# Patient Record
Sex: Male | Born: 1960
Health system: Southern US, Community
[De-identification: ages and names within clinical notes are randomized; demographics above are authoritative.]

## PROBLEM LIST (undated history)

## (undated) DIAGNOSIS — E538 Deficiency of other specified B group vitamins: Secondary | ICD-10-CM

## (undated) DIAGNOSIS — K760 Fatty (change of) liver, not elsewhere classified: Secondary | ICD-10-CM

## (undated) DIAGNOSIS — G4730 Sleep apnea, unspecified: Secondary | ICD-10-CM

## (undated) DIAGNOSIS — E7800 Pure hypercholesterolemia, unspecified: Secondary | ICD-10-CM

## (undated) DIAGNOSIS — Z808 Family history of malignant neoplasm of other organs or systems: Secondary | ICD-10-CM

## (undated) DIAGNOSIS — H9191 Unspecified hearing loss, right ear: Secondary | ICD-10-CM

## (undated) DIAGNOSIS — Z800 Family history of malignant neoplasm of digestive organs: Secondary | ICD-10-CM

## (undated) DIAGNOSIS — Z8042 Family history of malignant neoplasm of prostate: Secondary | ICD-10-CM

## (undated) DIAGNOSIS — I509 Heart failure, unspecified: Secondary | ICD-10-CM

## (undated) DIAGNOSIS — E559 Vitamin D deficiency, unspecified: Secondary | ICD-10-CM

## (undated) DIAGNOSIS — R7303 Prediabetes: Secondary | ICD-10-CM

## (undated) HISTORY — DX: Pure hypercholesterolemia, unspecified: E78.00

## (undated) HISTORY — DX: Prediabetes: R73.03

## (undated) HISTORY — DX: Family history of malignant neoplasm of other organs or systems: Z80.8

## (undated) HISTORY — DX: Unspecified hearing loss, right ear: H91.91

## (undated) HISTORY — PX: NASAL SEPTUM SURGERY: SHX37

## (undated) HISTORY — DX: Sleep apnea, unspecified: G47.30

## (undated) HISTORY — DX: Fatty (change of) liver, not elsewhere classified: K76.0

## (undated) HISTORY — PX: CARDIAC CATHETERIZATION: SHX172

## (undated) HISTORY — DX: Family history of malignant neoplasm of digestive organs: Z80.0

## (undated) HISTORY — DX: Heart failure, unspecified: I50.9

## (undated) HISTORY — PX: INSERT / REPLACE / REMOVE PACEMAKER: SUR710

## (undated) HISTORY — DX: Deficiency of other specified B group vitamins: E53.8

## (undated) HISTORY — DX: Family history of malignant neoplasm of prostate: Z80.42

## (undated) HISTORY — DX: Vitamin D deficiency, unspecified: E55.9

---

## 2012-11-11 ENCOUNTER — Encounter: Payer: Self-pay | Admitting: Neurology

## 2012-11-11 ENCOUNTER — Ambulatory Visit (INDEPENDENT_AMBULATORY_CARE_PROVIDER_SITE_OTHER): Payer: BLUE CROSS/BLUE SHIELD | Admitting: Neurology

## 2012-11-11 VITALS — BP 109/75 | HR 65 | Ht 66.0 in | Wt 203.0 lb

## 2012-11-11 DIAGNOSIS — IMO0001 Reserved for inherently not codable concepts without codable children: Secondary | ICD-10-CM

## 2012-11-11 DIAGNOSIS — Z820 Family history of epilepsy and other diseases of the nervous system: Secondary | ICD-10-CM

## 2012-11-11 DIAGNOSIS — R51 Headache: Secondary | ICD-10-CM

## 2012-11-11 DIAGNOSIS — R519 Headache, unspecified: Secondary | ICD-10-CM

## 2012-11-11 DIAGNOSIS — R569 Unspecified convulsions: Secondary | ICD-10-CM

## 2012-11-11 NOTE — Progress Notes (Signed)
Subjective:    Patient ID: Brandon Madden is a 52 y.o. male.  HPI Huston Foley, MD, PhD [facility] [address], [address] [address] [place], [place] [address]  Dear Dr. [place],   I saw your patient, [name], upon your kind request in my neurologic clinic today for initial consultation of his episodic lightheadedness and memory issues. The patient is unaccompanied today. As you know, [name] is a very pleasant 52 year old right-handed gentleman with an underlying medical history of hyperlipidemia, obstructive sleep apnea on CPAP, vitamin D deficiency, hearing loss, and allergic rhinitis who has a several year history of intermittent lightheadedness and dizziness associated with nausea followed by a headache. His first episode was in 2002 or 2003, and consisted of a sensation coming on in his head and it feels, as if he is about to pass out. He becomes pale, does not feel like talking, but there is no slurring of speech. No change in vision, as in, no diplopia, no constriction in his VF, no blurry vision. He has associated nausea. This lasts for 1-4 minutes, and is followed by a generalized headache, which a tightness-like sensation, no photo or sonophobia, no N/V at the time. He HA lasts all day. He feels very exhausted afterwards.  Frequency was very sporadic, every other year and he had a very extensive, mostly cardiac w/u at the time. In the last 7 weeks he has had 2 episodes, which is unusual and disturbing to him. He does not smoke or drink, no additional stressors, no changes in meds, no lifestyle changes.  His 36 yo son has epilepsy since infancy. He himself has never had a twitching spell or convulsion or staring spell.  He has a FHx of migraine only in one sister; he is one of 10 children. Of note, he has R ear hearing loss since infancy; he started having a tuft of grey hair since age 21 and was suspected to have Waardenburg Syndrome.  He does not remember if he  had an MRI brain or EEG in the past. He has had mild memory loss. He moved to [place] in 6/12. 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 He has a FHx of AD in his father and both paternal grandparents. Father passed away at age 22 with a sudden MI. Mother is 69 and in good health. No FHx of stroke. Never had TIA or stroke symptoms, denying sudden onset of one sided weakness, numbness, tingling, slurring of speech or droopy face, new hearing loss, tinnitus, diplopia or visual field cut or monocular loss of vision.    His Past Medical History Is Significant For: Past Medical History  Diagnosis Date  . Deafness in right ear   . Sleep apnea     His Past Surgical History Is Significant For: Past Surgical History  Procedure Laterality Date  . Nasal septum surgery      His Family History Is Significant For: Family History  Problem Relation Age of Onset  . Heart attack Father     His Social History Is Significant For: History   Social History  . Marital Status: Married    Spouse Name: [name]    Number of Children: 4  . Years of Education: Law   Occupational History  .      [employer]   Social History Main Topics  . Smoking status: Never Smoker   . Smokeless tobacco: None  . Alcohol Use: No     Comment: quit:  2011  . Drug Use: No  . Sexual Activity: None   Other Topics Concern  . None   Social History Narrative   Patient lives at home with his family.   Caffeine Use: 2-3 cups daily    His Allergies Are:  Allergies  Allergen Reactions  . Bee Venom   :   His Current Medications Are:  Outpatient Encounter Prescriptions as of 11/11/2012  Medication Sig Dispense Refill  . CRESTOR 10 MG tablet Take 1 tablet by mouth daily.       No facility-administered encounter medications on file as of 11/11/2012.  : Review of Systems:  Out of a complete 14 point review of systems, all are reviewed and negative with the exception of these symptoms as listed below:  Review of Systems   HENT:       Ringine in ears  Respiratory:       Snoring  Neurological:       Memory loss Dizziness  Psychiatric/Behavioral: Positive for sleep disturbance (snoring).    Objective:  Neurologic Exam  Physical Exam Physical Examination:   Filed Vitals:   11/11/12 0843  BP: 109/75  Pulse: 65    General Examination: The patient is a very pleasant 52 y.o. male in no acute distress. He appears well-developed and well-nourished and very well groomed.   HEENT: Normocephalic, atraumatic, pupils are equal, round and reactive to light and accommodation. Funduscopic exam is normal with sharp disc margins noted. Extraocular tracking is good without limitation to gaze excursion or nystagmus noted. Normal smooth pursuit is noted. Hearing is grossly intact. Tympanic membranes are clear bilaterally. Face is symmetric with normal facial animation and normal facial sensation. Speech is clear with no dysarthria noted. There is no hypophonia. There is no lip, neck/head, jaw or voice tremor. Neck is supple with full range of passive and active motion. There are no carotid bruits on auscultation. Oropharynx exam reveals: mild mouth dryness, adequate dental hygiene and moderate airway crowding. Mallampati is class III. Tongue protrudes centrally and palate elevates symmetrically.    Chest: Clear to auscultation without wheezing, rhonchi or crackles noted.  Heart: S1+S2+0, regular and normal without murmurs, rubs or gallops noted.   Abdomen: Soft, non-tender and non-distended with normal bowel sounds appreciated on auscultation.  Extremities: There is no pitting edema in the distal lower extremities bilaterally. Pedal pulses are intact.  Skin: Warm and dry without trophic changes noted. There are no varicose veins.   Musculoskeletal: exam reveals no obvious joint deformities, tenderness or joint swelling or erythema.   Neurologically:  Mental status: The patient is awake, alert and oriented in all 4  spheres. His memory, attention, language and knowledge are appropriate. There is no aphasia, agnosia, apraxia or anomia. Speech is clear with normal prosody and enunciation. Thought process is linear. Mood is congruent and affect is normal.  Cranial nerves are as described above under HEENT exam. In addition, shoulder shrug is normal with equal shoulder height noted. Motor exam: Normal bulk, strength and tone is noted. There is no drift, tremor or rebound. Romberg is negative. Reflexes are 2+ throughout. Toes are downgoing bilaterally. Fine motor skills are intact with normal finger taps, normal hand movements, normal rapid alternating patting, normal foot taps and normal foot agility.  Cerebellar testing shows no dysmetria or intention tremor on finger to nose testing. Heel to shin is unremarkable bilaterally. There is no truncal or gait ataxia.  Sensory exam is intact to light touch, pinprick, vibration, temperature sense and  proprioception in the upper and lower extremities.  Gait, station and balance are unremarkable. No veering to one side is noted. No leaning to one side is noted. Posture is age-appropriate and stance is narrow based. No problems turning are noted. He turns en bloc. Tandem walk is unremarkable. Intact toe and heel stance is noted.               Assessment and Plan:   In summary, Maxfield Gildersleeve is a very pleasant 52 y.o.-year old male with a history of episodic lightheadedness, or dizziness, associated with nausea, which last for minutess and followed by a HA and fatigue, which last all day. His physical exam is stable and non-focal at this time and I reassured the patient in that regard.  I had a long chat with the patient about my findings and his Sx. I think the DDx include migraines, Sz auras, BP fluctuation, cardiac issues, much less likely TIA. I would like to start the workup with an EEG as well as a brain MRI with and without contrast because of the paroxysmal nature of these  episodes. I will do some additional blood work as well. I see that you have checked RPR, B12, vitamin D, and methylmalonic acid in your office on 10/28/2012. I do not have the results of those tests available at this time. I do not think we need to initiate any new medication at this time. We talked about maintaining a healthy lifestyle in general. I encouraged the patient to eat healthy, exercise daily and keep well hydrated, to keep a scheduled bedtime and wake time routine, to not skip any meals and eat healthy snacks in between meals and to have protein with every meal.    I answered all his questions today and the patient was in agreement with the above outlined plan. I would like to see the patient back in 3 months, sooner if the need arises and encouraged him to call with any interim questions, concerns, problems or updates and test results.   Thank you very much for allowing me to participate in the care of this nice patient. If I can be of any further assistance to you please do not hesitate to call me at [phone].  Sincerely,   Huston Foley, MD, PhD

## 2012-11-11 NOTE — Patient Instructions (Signed)
I think overall you are doing fairly well but I do want to suggest a few things today:  Remember to drink plenty of fluid, eat healthy meals and do not skip any meals. Try to eat protein with a every meal and eat a healthy snack such as fruit or nuts in between meals. Try to keep a regular sleep-wake schedule and try to exercise daily, particularly in the form of walking, 20-30 minutes a day, if you can.   As far as your medications are concerned, I would like to suggest: no new medications.   As far as diagnostic testing: MRI brain, EEG, blood work.   I would like to see you back in 3 months, sooner if we need to. Please call us with any interim questions, concerns, problems, updates or refill requests.  Please also call us for any test results so we can go over those with you on the phone. Brett Canales is my clinical assistant and will answer any of your questions and relay your messages to me and also relay most of my messages to you.  Our phone number is [phone]. We also have an after hours call service for urgent matters and there is a physician on-call for urgent questions. For any emergencies you know to call 911 or go to the nearest emergency room.

## 2012-11-12 LAB — COMPREHENSIVE METABOLIC PANEL
ALT: 29 IU/L (ref 0–44)
BUN/Creatinine Ratio: 19 (ref 9–20)
CO2: 29 mmol/L (ref 18–29)
Calcium: 9.4 mg/dL (ref 8.7–10.2)
Chloride: 102 mmol/L (ref 97–108)
Glucose: 99 mg/dL (ref 65–99)
Potassium: 4.7 mmol/L (ref 3.5–5.2)
Total Protein: 7 g/dL (ref 6.0–8.5)

## 2012-11-12 LAB — SEDIMENTATION RATE: Sed Rate: 4 mm/hr (ref 0–30)

## 2012-11-12 LAB — TSH: TSH: 1.75 u[IU]/mL (ref 0.450–4.500)

## 2012-11-12 LAB — C-REACTIVE PROTEIN: CRP: 6.9 mg/L — ABNORMAL HIGH (ref 0.0–4.9)

## 2012-11-12 LAB — ANA W/REFLEX: Anti Nuclear Antibody(ANA): NEGATIVE

## 2012-11-12 LAB — HGB A1C W/O EAG: Hgb A1c MFr Bld: 5.8 % — ABNORMAL HIGH (ref 4.8–5.6)

## 2012-11-12 NOTE — Progress Notes (Signed)
Quick Note:  Please advise patient that his lab work showed normal thyroid function screening test, normal sedimentation rate, normal autoimmune marker called ANA. He did have a mildly elevated C. reactive protein which is a nonspecific marker for any inflammation or infection in the body. This is a nonspecific test results but may indicate that he has an inflammation in his joint. This could be from back pain as well. His diabetes marker was borderline at 5.8, indicating that he is at risk for diabetes. No other followup needed on this particular blood work result. Please ask them to keep any other followup appointments were test appointments. Huston Foley, MD, PhD [facility] ([facility])  ______

## 2012-11-14 ENCOUNTER — Telehealth: Payer: Self-pay

## 2012-11-14 ENCOUNTER — Ambulatory Visit (INDEPENDENT_AMBULATORY_CARE_PROVIDER_SITE_OTHER): Payer: BLUE CROSS/BLUE SHIELD | Admitting: Radiology

## 2012-11-14 DIAGNOSIS — Z820 Family history of epilepsy and other diseases of the nervous system: Secondary | ICD-10-CM

## 2012-11-14 DIAGNOSIS — R569 Unspecified convulsions: Secondary | ICD-10-CM

## 2012-11-14 DIAGNOSIS — IMO0001 Reserved for inherently not codable concepts without codable children: Secondary | ICD-10-CM

## 2012-11-14 DIAGNOSIS — R519 Headache, unspecified: Secondary | ICD-10-CM

## 2012-11-14 NOTE — Telephone Encounter (Signed)
Message copied by [facility] on Thu Nov 14, 2012  2:20 PM ------      Message from: Huston Foley      Created: Tue Nov 12, 2012  4:54 PM       Please advise patient that his lab work showed normal thyroid function screening test, normal sedimentation rate, normal autoimmune marker called ANA. He did have a mildly elevated C. reactive protein which is a nonspecific marker for any inflammation or infection in the body. This is a nonspecific test results but may indicate that he has an inflammation in his joint. This could be from back pain as well. His diabetes marker was borderline at 5.8, indicating that he is at risk for diabetes. No other followup needed on this particular blood work result. Please ask them to keep any other followup appointments were test appointments.      Huston Foley, MD, PhD      [facility])       ------

## 2012-11-14 NOTE — Telephone Encounter (Signed)
I called patient and reviewed Dr. Johny Sax' findings. Patient asked what the cut off was for diabetes. I let him know that is 6.5. He asked what a normal range is and I let him know 4.0 to 5.8. I discussed how he could better manage his blood sugars with exercise and diet, particularly decreasing his carbohydrates and refined sugars. Patient thanked me.

## 2012-11-19 NOTE — Procedures (Signed)
HISTORY: 52 year old male, with history of episodic lightheadedness, with associated nausea, followed by a headache and fatigue, lasting all day,  TECHNIQUE:  16 channel EEG was performed based on standard 10-16 international system. One channel was dedicated to EKG, which has demonstrates normal sinus rhythm of 84 beats per minutes.  Upon awakening, the posterior background activity was well-developed, in alpha range 9 Hz,  reactive to eye opening and closure.  There was no evidence of epilepsy for discharge.  Photic stimulation was performed, which induced a symmetric photic driving.  Hyperventilation was performed, there was no abnormality elicit.  Stage II sleep was achieved, as evident by K complex, sleep spindles  CONCLUSION: This is a  normal awake and asleep EEG.  There is no electrodiagnostic evidence of epileptiform discharge

## 2012-11-20 ENCOUNTER — Ambulatory Visit (INDEPENDENT_AMBULATORY_CARE_PROVIDER_SITE_OTHER): Payer: BLUE CROSS/BLUE SHIELD

## 2012-11-20 DIAGNOSIS — R51 Headache: Secondary | ICD-10-CM

## 2012-11-20 DIAGNOSIS — R519 Headache, unspecified: Secondary | ICD-10-CM

## 2012-11-20 DIAGNOSIS — Z820 Family history of epilepsy and other diseases of the nervous system: Secondary | ICD-10-CM

## 2012-11-20 DIAGNOSIS — R569 Unspecified convulsions: Secondary | ICD-10-CM

## 2012-11-20 DIAGNOSIS — IMO0001 Reserved for inherently not codable concepts without codable children: Secondary | ICD-10-CM

## 2012-11-20 MED ORDER — GADOPENTETATE DIMEGLUMINE 469.01 MG/ML IV SOLN: 19.0000 mL | Freq: Once | INTRAVENOUS | Status: AC | PRN

## 2012-11-20 NOTE — Progress Notes (Signed)
Quick Note:  Please call and advise the patient that the EEG or brain wave test we performed was reported as normal in the awake and sleep states. We checked for abnormal electrical discharges in the brain waves and the report suggested normal findings. No further action is required on this test at this time. Please remind patient to keep any upcoming appointments or tests and to call us with any interim questions, concerns, problems or updates. Thanks,  Delcie Ruppert, MD, PhD    ______ 

## 2012-11-20 NOTE — Progress Notes (Signed)
Quick Note:  Left message on cell that EEG results were normal, per Dr. Frances Furbish. Told wot call with any questions. ______

## 2012-11-21 NOTE — Progress Notes (Signed)
Quick Note:  Please call patient regarding the recent brain MRI: The brain scan showed a normal structure of the brain and no significant volume loss which we call atrophy. There were changes in the deeper structures of the brain, which we call white matter changes or microvascular changes. These were reported as minimal in his case.  These are tiny white spots, that occur with time and are seen in a variety of conditions, including with normal aging, chronic hypertension, chronic headaches, especially migraine HAs, chronic diabetes, chronic hyperlipidemia. These are not strokes and no mass or lesion or contrast enhancement was seen which is reassuring. Again, there were no acute findings, such as a stroke, or mass or blood products. No further action is required on this test at this time, other than re-enforcing the importance of good blood pressure control, good cholesterol control, good blood sugar control, and weight management. Please remind patient to keep any upcoming appointments or tests and to call us with any interim questions, concerns, problems or updates. Thanks,  Huston Foley, MD, PhD    ______

## 2012-11-25 NOTE — Progress Notes (Signed)
Quick Note:  I called pt and relayed the results of MRI. Pt has not had anymore episodes. Has appt in 2 mo. He asked if anything more to be done? Call his Cell #. ______

## 2013-03-17 ENCOUNTER — Ambulatory Visit: Payer: BLUE CROSS/BLUE SHIELD | Admitting: Neurology

## 2015-11-26 DIAGNOSIS — Z0289 Encounter for other administrative examinations: Secondary | ICD-10-CM

## 2016-01-06 DIAGNOSIS — Z0000 Encounter for general adult medical examination without abnormal findings: Secondary | ICD-10-CM | POA: Diagnosis not present

## 2016-11-20 DIAGNOSIS — Z23 Encounter for immunization: Secondary | ICD-10-CM | POA: Diagnosis not present

## 2016-11-20 DIAGNOSIS — Z0000 Encounter for general adult medical examination without abnormal findings: Secondary | ICD-10-CM | POA: Diagnosis not present

## 2016-12-12 DIAGNOSIS — R7309 Other abnormal glucose: Secondary | ICD-10-CM | POA: Diagnosis not present

## 2016-12-12 DIAGNOSIS — E7800 Pure hypercholesterolemia, unspecified: Secondary | ICD-10-CM | POA: Diagnosis not present

## 2016-12-12 DIAGNOSIS — Z125 Encounter for screening for malignant neoplasm of prostate: Secondary | ICD-10-CM | POA: Diagnosis not present

## 2016-12-12 DIAGNOSIS — Z79899 Other long term (current) drug therapy: Secondary | ICD-10-CM | POA: Diagnosis not present

## 2017-04-26 DIAGNOSIS — E7800 Pure hypercholesterolemia, unspecified: Secondary | ICD-10-CM | POA: Diagnosis not present

## 2017-04-26 DIAGNOSIS — R6889 Other general symptoms and signs: Secondary | ICD-10-CM | POA: Diagnosis not present

## 2017-04-26 DIAGNOSIS — Z79899 Other long term (current) drug therapy: Secondary | ICD-10-CM | POA: Diagnosis not present

## 2017-07-21 DIAGNOSIS — B349 Viral infection, unspecified: Secondary | ICD-10-CM | POA: Diagnosis not present

## 2017-07-21 DIAGNOSIS — R509 Fever, unspecified: Secondary | ICD-10-CM | POA: Diagnosis not present

## 2017-09-20 DIAGNOSIS — Z0000 Encounter for general adult medical examination without abnormal findings: Secondary | ICD-10-CM | POA: Diagnosis not present

## 2017-10-12 DIAGNOSIS — Z8042 Family history of malignant neoplasm of prostate: Secondary | ICD-10-CM | POA: Diagnosis not present

## 2017-10-18 ENCOUNTER — Telehealth: Payer: Self-pay | Admitting: Licensed Clinical Social Worker

## 2017-10-18 ENCOUNTER — Encounter: Payer: Self-pay | Admitting: Licensed Clinical Social Worker

## 2017-10-18 NOTE — Telephone Encounter (Signed)
A genetic counseling appt has been scheduled for the pt to see Ike BeneBrianna Teapole on 9/30 at 9am. Pt aware to arrive 15 minutes early. Letter mailed.

## 2017-11-16 ENCOUNTER — Encounter: Payer: Self-pay | Admitting: Licensed Clinical Social Worker

## 2017-11-19 ENCOUNTER — Encounter: Payer: Self-pay | Admitting: Licensed Clinical Social Worker

## 2017-11-19 ENCOUNTER — Inpatient Hospital Stay: Payer: BLUE CROSS/BLUE SHIELD

## 2017-11-19 ENCOUNTER — Inpatient Hospital Stay: Payer: BLUE CROSS/BLUE SHIELD | Attending: Genetic Counselor | Admitting: Licensed Clinical Social Worker

## 2017-11-19 DIAGNOSIS — Z8042 Family history of malignant neoplasm of prostate: Secondary | ICD-10-CM

## 2017-11-19 DIAGNOSIS — Z808 Family history of malignant neoplasm of other organs or systems: Secondary | ICD-10-CM

## 2017-11-19 DIAGNOSIS — Z7183 Encounter for nonprocreative genetic counseling: Secondary | ICD-10-CM

## 2017-11-19 DIAGNOSIS — Z800 Family history of malignant neoplasm of digestive organs: Secondary | ICD-10-CM

## 2017-11-19 NOTE — Progress Notes (Signed)
REFERRING PROVIDER: Orpah Melter, MD [address] [place], [place] [address]  PRIMARY PROVIDER:  Orpah Melter, MD  PRIMARY REASON FOR VISIT:  1. Family history of prostate cancer   2. Family history of pancreatic cancer   3. Family history of skin cancer      HISTORY OF PRESENT ILLNESS:   Brandon Madden, a 57 y.o. male, was seen for a [facility] cancer genetics consultation at the request of Dr. Olen Pel due to a family history of prostate and pancreatic cancer.  Brandon Madden presents to clinic today to discuss the possibility of a hereditary predisposition to cancer, genetic testing, and to further clarify his future cancer risks, as well as potential cancer risks for family members.     Brandon Madden is a 57 y.o. male with no personal history of cancer.  He reports that his colonoscopy at age 69 was normal and his prostate screening has been normal. No major exposures to radiation.    Past Medical History:  Diagnosis Date  . Deafness in right ear   . Family history of pancreatic cancer   . Family history of prostate cancer   . Family history of skin cancer   . Sleep apnea     Past Surgical History:  Procedure Laterality Date  . NASAL SEPTUM SURGERY      Social History   Socioeconomic History  . Marital status: Married    Spouse name: Verdis Frederickson  . Number of children: 4  . Years of education: Law  . Highest education level: Not on file  Occupational History    Comment: [employer]  Social Needs  . Financial resource strain: Not on file  . Food insecurity:    Worry: Not on file    Inability: Not on file  . Transportation needs:    Medical: Not on file    Non-medical: Not on file  Tobacco Use  . Smoking status: Never Smoker  Substance and Sexual Activity  . Alcohol use: No    Comment: quit: 2011  . Drug use: No  . Sexual activity: Not on file  Lifestyle  . Physical activity:    Days per week: Not on file    Minutes per session: Not on file  . Stress: Not on file   Relationships  . Social connections:    Talks on phone: Not on file    Gets together: Not on file    Attends religious service: Not on file    Active member of club or organization: Not on file    Attends meetings of clubs or organizations: Not on file    Relationship status: Not on file  Other Topics Concern  . Not on file  Social History Narrative   Patient lives at home with his family.   Caffeine Use: 2-3 cups daily     FAMILY HISTORY:  We obtained a detailed, 4-generation family history.  Significant diagnoses are listed below: Family History  Problem Relation Age of Onset  . Heart attack Father   . Pancreatic cancer Maternal Grandmother        dx 74s, d 16s  . Skin cancer Sister   . Prostate cancer Brother        dx 3  . Prostate cancer Brother        dx 69, metastatic, d 18   Brandon Madden has four children, three daughters ages 56,13 and 35 and a son age 31. He has 3 brothers and 6 sisters. One of  his brothers, Legrand Madden, recently passed away from metastatic prostate cancer at age 59. He was originally diagnosed at 25. Brandon Madden reports that it may have been a neuroendocrine carcinoma but the last he heard it was being called metastatic prostate cancer. His brother Gershon Madden also was diagnosed with prostate cancer at age 55, he is 109 and doing well. One of Brandon Madden sisters had skin cancer but he is unsure of the type.   Brandon Madden father died at 5. He had a sister that died young, but Brandon Madden does not believe it was cancer. Mr.  Madden also has a paternal uncle who is 91, no cancer history, and two paternal cousins without cancer history. His paternal grandfather and grandmother both died in their late 82's.   Brandon Madden mother is alive at 43, no history of cancer. She had a brother who died at 8, a brother who is living at 63 and a sister who is living at 42, no cancers for any of them. Brandon Madden maternal cousins also do not have cancer history. His maternal  grandfather died in his 89's and his maternal grandmother was diagnosed with pancreatic cancer in her 34's and died in her 58's.   Brandon Madden is unaware of previous family history of genetic testing for hereditary cancer risks. Patient's maternal ancestors are of [place] descent, and paternal ancestors are of [place]/Irish descent. There is no reported Ashkenazi Jewish ancestry. There is no known consanguinity.  GENETIC COUNSELING ASSESSMENT: Brandon Madden is a 57 y.o. male with a family history of prostate cancer which is somewhat suggestive of a Hereditary Cancer Predisposition Syndrome. We, therefore, discussed and recommended the following at today's visit.   DISCUSSION: We discussed that about 5-10% of cancer cases are hereditary. We reviewed the characteristics, features and inheritance patterns of hereditary cancer syndromes including the genes BRCA1 and BRCA2. We also discussed genetic testing, including the appropriate family members to test, the process of testing, insurance coverage and turn-around-time for results. We discussed the implications of a negative, positive and/or variant of uncertain significant result. We recommended Brandon Madden pursue genetic testing for the [facility] Multi-Cancer Panel + Melanoma Panel + Pancreatic Panel.  The Multi-Cancer Panel offered by [facility] includes sequencing and/or deletion duplication testing of the following 84 genes: AIP, ALK, APC, ATM, AXIN2,BAP1,  BARD1, BLM, BMPR1A, BRCA1, BRCA2, BRIP1, CASR, CDC73, CDH1, CDK4, CDKN1B, CDKN1C, CDKN2A (p14ARF), CDKN2A (p16INK4a), CEBPA, CHEK2, CTNNA1, DICER1, DIS3L2, EGFR (c.2369C>T, p.Thr790Met variant only), EPCAM (Deletion/duplication testing only), FH, FLCN, GATA2, GPC3, GREM1 (Promoter region deletion/duplication testing only), HOXB13 (c.251G>A, p.Gly84Glu), HRAS, KIT, MAX, MEN1, MET, MITF (c.952G>A, p.Glu318Lys variant only), MLH1, MSH2, MSH3, MSH6, MUTYH, NBN, NF1, NF2, NTHL1, PALB2, PDGFRA, PHOX2B, PMS2, POLD1,  POLE, POT1, PRKAR1A, PTCH1, PTEN, RAD50, RAD51C, RAD51D, RB1, RECQL4, RET, RUNX1, SDHAF2, SDHA (sequence changes only), SDHB, SDHC, SDHD, SMAD4, SMARCA4, SMARCB1, SMARCE1, STK11, SUFU, TERC, TERT, TMEM127, TP53, TSC1, TSC2, VHL, WRN and WT1.   The Melanoma Panel includes sequencing and/or deletion duplication testing of: BAP1 BRCA2 CDK4 CDKN2A MITF POT1 PTEN RB1 TP53 BRCA1 MC1R TERT  The Pancreatic Panel includes sequencing and/or deletion duplication testing of: APC ATM BMPR1A BRCA1 BRCA2 CDKN2A EPCAM MEN1 MLH1 MSH2 MSH6 NF1 PALB2 PMS2 SMAD4 STK11 TP53 TSC1 TSC2 VHL CDK4 FANCC PALLD   We discussed that if he is found to have a mutation in one of these genes, it may impact future medical management recommendations such as increased cancer screenings and consideration of risk reducing surgeries.  A positive result  could also have implications for the patient's family members.  A Negative result would mean we were unable to identify a hereditary component for his family history of cancer, but this does not rule out the possibility of a hereditary componenet to his family history of cancer.There could be mutations that are undetectable by current technology, or in genes not yet tested or identified to increase cancer risk.    We discussed the potential to find a Variant of Uncertain Significance or VUS.  These are variants that have not yet been identified as pathogenic or benign, and it is unknown if this variant is associated with increased cancer risk or if this is a normal finding.  Most VUS's are reclassified to benign or likely benign.   It should not be used to make medical management decisions. With time, we suspect the lab will determine the significance of any VUS's identified if any.   We discussed that some people do not want to undergo genetic testing due to fear of genetic discrimination.  A federal law called the Genetic Information Non-Discrimination Act (GINA) of 2008 helps protect  individuals against genetic discrimination based on their genetic test results.  It impacts both health insurance and employment.  For health insurance, it protects against increased premiums, being kicked off insurance or being forced to take a test in order to be insured.  For employment it protects against hiring, firing and promoting decisions based on genetic test results.  Health status due to a cancer diagnosis is not protected under GINA.  This law does not protect life insurance, disability insurance, or other types of insurance.   Based on Mr. Litaker family history of metastatic prostate cancer and pancreatic cancer, he meets medical criteria for genetic testing. Despite that he meets criteria, he may still have an out of pocket cost. The lab will notify him of his out of pocket cost.   PLAN: After considering the risks, benefits, and limitations, Mr. Dyment  provided informed consent to pursue genetic testing and the blood sample was sent to [facility] for analysis of the Multi Cancer Panel + Melanoma Panel + Pancreatic Panel. Results should be available within approximately 2-3 weeks' time, at which point they will be disclosed by telephone to Mr. Roig, as will any additional recommendations warranted by these results. Mr. Tabora will receive a summary of his genetic counseling visit and a copy of his results once available. This information will also be available in Epic.  Based on Mr. Hoadley family history, we recommended his brother, who was diagnosed with prostate cancer at age 28, have genetic counseling and testing. Mr. Wilmeth will let us know if we can be of any assistance in coordinating genetic counseling and/or testing for this family member.   Lastly, we encouraged Mr. Antenucci to remain in contact with cancer genetics annually so that we can continuously update the family history and inform him of any changes in cancer genetics and testing that may be of benefit for this  family.   Mr.  Rochin questions were answered to his satisfaction today. Our contact information was provided should additional questions or concerns arise. Thank you for the referral and allowing [place] to share in the care of your patient.   Epimenio Foot, MS Genetic Counselor [email or web address] Phone: [phone]  The patient was seen for a total of 35 minutes in face-to-face genetic counseling.

## 2017-11-26 DIAGNOSIS — Z23 Encounter for immunization: Secondary | ICD-10-CM | POA: Diagnosis not present

## 2017-11-26 DIAGNOSIS — Z0000 Encounter for general adult medical examination without abnormal findings: Secondary | ICD-10-CM | POA: Diagnosis not present

## 2017-12-07 ENCOUNTER — Encounter: Payer: Self-pay | Admitting: Licensed Clinical Social Worker

## 2017-12-07 ENCOUNTER — Ambulatory Visit: Payer: Self-pay | Admitting: Licensed Clinical Social Worker

## 2017-12-07 ENCOUNTER — Telehealth: Payer: Self-pay | Admitting: Licensed Clinical Social Worker

## 2017-12-07 DIAGNOSIS — Z8042 Family history of malignant neoplasm of prostate: Secondary | ICD-10-CM

## 2017-12-07 DIAGNOSIS — Z1379 Encounter for other screening for genetic and chromosomal anomalies: Secondary | ICD-10-CM

## 2017-12-07 DIAGNOSIS — Z808 Family history of malignant neoplasm of other organs or systems: Secondary | ICD-10-CM

## 2017-12-07 DIAGNOSIS — Z800 Family history of malignant neoplasm of digestive organs: Secondary | ICD-10-CM

## 2017-12-07 NOTE — Progress Notes (Signed)
HPI:  Mr. Pelaez was previously seen in the [facility] clinic on 11/19/2017 due to a family history of prostate and pancreatic cancer and concerns regarding a hereditary predisposition to cancer. Please refer to our prior cancer genetics clinic note for more information regarding Mr. Granzow medical, social and family histories, and our assessment and recommendations, at the time. Mr. Maione recent genetic test results were disclosed to him, as well as recommendations warranted by these results. These results and recommendations are discussed in more detail below.  CANCER HISTORY:   No history exists.     FAMILY HISTORY:  We obtained a detailed, 4-generation family history.  Significant diagnoses are listed below: Family History  Problem Relation Age of Onset  . Heart attack Father   . Pancreatic cancer Maternal Grandmother        dx 42s, d 53s  . Skin cancer Sister   . Prostate cancer Brother        dx 3  . Prostate cancer Brother        dx 11, metastatic, d 58   Mr. Figueira has four children, three daughters ages 74,13 and 10 and a son age 50. He has 3 brothers and 6 sisters. One of his brothers, Legrand Como, recently passed away from metastatic prostate cancer at age 66. He was originally diagnosed at 50. Mr. Botero reports that it may have been a neuroendocrine carcinoma but the last he heard it was being called metastatic prostate cancer. His brother Gershon Mussel also was diagnosed with prostate cancer at age 79, he is 65 and doing well. One of Mr. Gergen sisters had skin cancer but he is unsure of the type.   Mr. Cuadros father died at 30. He had a sister that died young, but Mr. Oaxaca does not believe it was cancer. Mr.  Breton also has a paternal uncle who is 51, no cancer history, and two paternal cousins without cancer history. His paternal grandfather and grandmother both died in their late 55's.   Mr. Colocho mother is alive at 21, no history of cancer. She had a  brother who died at 38, a brother who is living at 20 and a sister who is living at 63, no cancers for any of them. Mr. Donaway maternal cousins also do not have cancer history. His maternal grandfather died in his 34's and his maternal grandmother was diagnosed with pancreatic cancer in her 10's and died in her 66's.   Mr. Flax is unaware of previous family history of genetic testing for hereditary cancer risks. Patient's maternal ancestors are of [place] descent, and paternal ancestors are of [place]/Irish descent. There is no reported Ashkenazi Jewish ancestry. There is no known consanguinity.  GENETIC TEST RESULTS: Genetic testing performed through [facility]'s Multi-Cancer Panel + Pancreatic + Melanoma Panel reported out on 12/07/2017 showed no pathogenic mutations. The Multi-Cancer Panel offered by [facility] includes sequencing and/or deletion duplication testing of the following 84 genes: AIP, ALK, APC, ATM, AXIN2,BAP1,  BARD1, BLM, BMPR1A, BRCA1, BRCA2, BRIP1, CASR, CDC73, CDH1, CDK4, CDKN1B, CDKN1C, CDKN2A (p14ARF), CDKN2A (p16INK4a), CEBPA, CHEK2, CTNNA1, DICER1, DIS3L2, EGFR (c.2369C>T, p.Thr790Met variant only), EPCAM (Deletion/duplication testing only), FH, FLCN, GATA2, GPC3, GREM1 (Promoter region deletion/duplication testing only), HOXB13 (c.251G>A, p.Gly84Glu), HRAS, KIT, MAX, MEN1, MET, MITF (c.952G>A, p.Glu318Lys variant only), MLH1, MSH2, MSH3, MSH6, MUTYH, NBN, NF1, NF2, NTHL1, PALB2, PDGFRA, PHOX2B, PMS2, POLD1, POLE, POT1, PRKAR1A, PTCH1, PTEN, RAD50, RAD51C, RAD51D, RB1, RECQL4, RET, RUNX1, SDHAF2, SDHA (sequence changes only), SDHB, SDHC, SDHD, SMAD4, SMARCA4,  SMARCB1, SMARCE1, STK11, SUFU, TERC, TERT, TMEM127, TP53, TSC1, TSC2, VHL, WRN and WT1. The Melanoma panel offered by [facility] includes sequencing and/or deletion duplication testing of the following 12 genes: BAP1, BRCA1, BRCA2, BRIP1, CDK4, CDKN2A (p14ARF), CDKN2A (p16INK4a), MC1R, POT1, PTEN, RB1, TERT, and TP53.  The following  gene was evaluated for sequence changes only: MITF (c.952G>A, p.GLU318Lys variant only).  The test report will be scanned into EPIC and will be located under the Molecular Pathology section of the Results Review tab. A portion of the result report is included below for reference.     We discussed with Mr. Bloodworth that because current genetic testing is not perfect, it is possible there may be a gene mutation in one of these genes that current testing cannot detect, but that chance is small.  We also discussed, that there could be another gene that has not yet been discovered, or that we have not yet tested, that is responsible for the cancer diagnoses in the family. It is also possible there is a hereditary cause for the cancer in the family that Mr. Dingee did not inherit and therefore was not identified in his testing.  Therefore, it is important to remain in touch with cancer genetics in the future so that we can continue to offer Mr. Ikard the most up to date genetic testing.   ADDITIONAL GENETIC TESTING: We discussed with Mr. Ealy that his genetic testing was fairly extensive.  If there are are genes identified to increase cancer risk that can be analyzed in the future, we would be happy to discuss and coordinate this testing at that time.    CANCER SCREENING RECOMMENDATIONS: Mr. Tamura test result is considered negative (normal).  This means that we have not identified a hereditary cause for his family history of cancer at this time. This normal indicates that it is unlikely Mr. Armstrong has an increased risk of cancer due to a mutation in one of these genes.   While reassuring, this does not definitively rule out a hereditary predisposition to cancer. It is still possible that there could be genetic mutations that are undetectable by current technology, or genetic mutations in genes that have not been tested or identified to increase cancer risk.  Therefore, it is recommended he continue to  follow the cancer management and screening guidelines provided by his oncology and primary healthcare provider. An individual's cancer risk is not determined by genetic test results alone.  Overall cancer risk assessment includes additional factors such as personal medical history, family history, etc.  These should be used to make a personalized plan for cancer prevention and surveillance.    RECOMMENDATIONS FOR FAMILY MEMBERS:  Relatives in this family might be at some increased risk of developing cancer, over the general population risk, simply due to the family history of cancer.  We recommended women in this family have a yearly mammogram beginning at age 16, or 25 years younger than the earliest onset of cancer, an annual clinical breast exam, and perform monthly breast self-exams. Women in this family should also have a gynecological exam as recommended by their primary provider. All family members should have a colonoscopy by age 29 (or as directed by their doctors).  All family members should inform their physicians about the family history of cancer so their doctors can make the most appropriate screening recommendations for them.   It is also possible there is a hereditary cause for the cancer in Mr. Guthridge family that he did not  inherit and therefore was not identified in him.  We recommended his brother who has a history of prostate cancer as well as his mother and her siblings have genetic counseling and testing. Mr. Smoker will let us know if we can be of any assistance in coordinating genetic counseling and/or testing for these family members.   FOLLOW-UP: Lastly, we discussed with Mr. Cahall that cancer genetics is a rapidly advancing field and it is possible that new genetic tests will be appropriate for him and/or his family members in the future. We encouraged him to remain in contact with cancer genetics on an annual basis so we can update his personal and family histories and let him  know of advances in cancer genetics that may benefit this family.   Our contact number was provided. Mr. Frayre questions were answered to his satisfaction, and he knows he is welcome to call us at anytime with additional questions or concerns.  Faith Rogue, MS Genetic Counselor [email or web address] Phone: [phone]

## 2017-12-07 NOTE — Telephone Encounter (Signed)
Revealed negative genetic testing.  This normal result is reassuring. It is unlikely that there is an increased risk of cancer due to a mutation in one of these genes.  However, genetic testing is not perfect, and cannot definitively rule out a hereditary cause.  It will be important for him to keep in contact with genetics to learn if any additional testing may be needed in the future.  We also discussed family members who could still benefit from genetic counseling and testing including his siblings, mother and maternal aunts/uncles.

## 2017-12-12 DIAGNOSIS — D225 Melanocytic nevi of trunk: Secondary | ICD-10-CM | POA: Diagnosis not present

## 2017-12-12 DIAGNOSIS — Z1283 Encounter for screening for malignant neoplasm of skin: Secondary | ICD-10-CM | POA: Diagnosis not present

## 2017-12-13 DIAGNOSIS — R5383 Other fatigue: Secondary | ICD-10-CM | POA: Diagnosis not present

## 2017-12-13 DIAGNOSIS — Z79899 Other long term (current) drug therapy: Secondary | ICD-10-CM | POA: Diagnosis not present

## 2017-12-13 DIAGNOSIS — Z125 Encounter for screening for malignant neoplasm of prostate: Secondary | ICD-10-CM | POA: Diagnosis not present

## 2017-12-13 DIAGNOSIS — E7800 Pure hypercholesterolemia, unspecified: Secondary | ICD-10-CM | POA: Diagnosis not present

## 2017-12-13 DIAGNOSIS — Z131 Encounter for screening for diabetes mellitus: Secondary | ICD-10-CM | POA: Diagnosis not present

## 2018-03-11 DIAGNOSIS — M545 Low back pain: Secondary | ICD-10-CM | POA: Diagnosis not present

## 2018-03-11 DIAGNOSIS — R1032 Left lower quadrant pain: Secondary | ICD-10-CM | POA: Diagnosis not present

## 2018-03-11 DIAGNOSIS — R197 Diarrhea, unspecified: Secondary | ICD-10-CM | POA: Diagnosis not present

## 2018-07-04 DIAGNOSIS — E7800 Pure hypercholesterolemia, unspecified: Secondary | ICD-10-CM | POA: Diagnosis not present

## 2018-07-04 DIAGNOSIS — R21 Rash and other nonspecific skin eruption: Secondary | ICD-10-CM | POA: Diagnosis not present

## 2018-08-09 ENCOUNTER — Encounter: Payer: Self-pay | Admitting: Family Medicine

## 2018-08-09 ENCOUNTER — Other Ambulatory Visit: Payer: Self-pay

## 2018-08-09 ENCOUNTER — Ambulatory Visit (INDEPENDENT_AMBULATORY_CARE_PROVIDER_SITE_OTHER): Payer: BC Managed Care – PPO | Admitting: Family Medicine

## 2018-08-09 VITALS — BP 120/68 | HR 82 | Temp 98.0°F | Resp 12 | Wt 191.6 lb

## 2018-08-09 DIAGNOSIS — R7982 Elevated C-reactive protein (CRP): Secondary | ICD-10-CM

## 2018-08-09 DIAGNOSIS — R7309 Other abnormal glucose: Secondary | ICD-10-CM

## 2018-08-09 DIAGNOSIS — E669 Obesity, unspecified: Secondary | ICD-10-CM

## 2018-08-09 DIAGNOSIS — E785 Hyperlipidemia, unspecified: Secondary | ICD-10-CM | POA: Diagnosis not present

## 2018-08-09 DIAGNOSIS — R55 Syncope and collapse: Secondary | ICD-10-CM

## 2018-08-09 DIAGNOSIS — E66811 Obesity, class 1: Secondary | ICD-10-CM | POA: Insufficient documentation

## 2018-08-09 DIAGNOSIS — E559 Vitamin D deficiency, unspecified: Secondary | ICD-10-CM

## 2018-08-09 NOTE — Patient Instructions (Signed)
A few things to remember from today's visit:   Hyperlipidemia, unspecified hyperlipidemia type - Plan: Comprehensive metabolic panel, Lipid panel  Elevated glucose level - Plan: Comprehensive metabolic panel, Hemoglobin A1c, VITAMIN D 25 Hydroxy (Vit-D Deficiency, Fractures), Insulin, Free (Bioactive)  Elevated C-reactive protein (CRP) - Plan: C-reactive protein  Pre-syncope - Plan: Ambulatory referral to Neurology   Please be sure medication list is accurate. If a new problem present, please set up appointment sooner than planned today.

## 2018-08-09 NOTE — Progress Notes (Signed)
HPI:   [name] is a 58 y.o. male, who is here today to establish care.  Former PCP: Dr Olen Pel. Last preventive routine visit: 11/2017.  Chronic medical problems: Hyperlipidemia, elevated glucose, anxiety, vitamin D deficiency among some.   Today he would like to discuss issues about his "metabolism" and prior lab numbers. He is concerned about abdominal fat, liver abnormalities found on prior labs, and glucose/A1c levels. He states that his father died at 9, MI with no history of medical problems.  "I do not want to die at 25."  Hyperlipidemia, currently he is on Crestor 10 mg daily. He exercises regularly, twice per week with his trainer and twice a week by himself. He follows a healthful diet. He denies alcohol intake.  Denies abdominal pain, nausea,vomiting, polydipsia,polyuria, or polyphagia. He would like to have a glucometer to check BS at home. Maternal cousin with history of DM 2.  Lab Results  Component Value Date   HGBA1C 5.8 (H) 11/11/2012    For a while he has had episodes of sudden onset of lightheadedness associated with palpitations and nausea.  He feels like he is going to pass out. According to patient, he had negative cardiac work-up around 2010. He also follows with neurologist, he would like to have a referral.  He was supposed to follow on MRI findings and elevated CRP.  Brain MRI 11/2012:Equivocal MRI brain (with and without) demonstrating minimal periventricular gliosis, with a single punctate right frontal juxtacortical focus of gliosis. Otherwise unremarkable study.  Denies frequent headaches, visual changes, or focal weakness.  Lab Results  Component Value Date   CRP 6.9 (H) 11/11/2012   He denies associated chest pain, dyspnea, diaphoresis, abdominal pain, vomiting, diarrhea, or urine/bowel incontinence.  He deals with a lot of stress,he has same job for 8 years now.  + Anxiety. He has not tried pharmacologic treatment, he  feels like he is dealing well with stress.  Vitamin D deficiency, he is taking 2 different multivitamins that contain vitamin D.  Review of Systems  Constitutional: Negative for activity change, appetite change, chills, fever and unexpected weight change.  HENT: Negative for nosebleeds, sore throat and trouble swallowing.   Respiratory: Negative for cough and wheezing.   Gastrointestinal: Negative for abdominal distention and blood in stool.  Genitourinary: Negative for decreased urine volume, dysuria and hematuria.  Musculoskeletal: Negative for arthralgias and myalgias.  Skin: Negative for pallor and rash.  Neurological: Negative for syncope, facial asymmetry, weakness, numbness and headaches.  Psychiatric/Behavioral: Negative for confusion. The patient is nervous/anxious.   Rest see pertinent positives and negatives per HPI.   Current Outpatient Medications on File Prior to Visit  Medication Sig Dispense Refill  . CRESTOR 10 MG tablet Take 1 tablet by mouth daily.     No current facility-administered medications on file prior to visit.      Past Medical History:  Diagnosis Date  . Deafness in right ear   . Family history of pancreatic cancer   . Family history of prostate cancer   . Family history of skin cancer   . Sleep apnea    Allergies  Allergen Reactions  . Bee Venom     Family History  Problem Relation Age of Onset  . Heart attack Father   . Pancreatic cancer Maternal Grandmother        dx 88s, d 21s  . Skin cancer Sister   . Prostate cancer Brother  dx [id number]  . Prostate cancer Brother        dx 6651, metastatic, d 7060    Social History   Socioeconomic History  . Marital status: Married    Spouse name: Byrd HesselbachMaria  . Number of children: 4  . Years of education: Law  . Highest education level: Not on file  Occupational History    Comment: [employer]  Social Needs  . Financial resource strain: Not on file  . Food insecurity    Worry: Not on file     Inability: Not on file  . Transportation needs    Medical: Not on file    Non-medical: Not on file  Tobacco Use  . Smoking status: Never Smoker  Substance and Sexual Activity  . Alcohol use: No    Comment: quit: 2011  . Drug use: No  . Sexual activity: Not on file  Lifestyle  . Physical activity    Days per week: Not on file    Minutes per session: Not on file  . Stress: Not on file  Relationships  . Social Musicianconnections    Talks on phone: Not on file    Gets together: Not on file    Attends religious service: Not on file    Active member of club or organization: Not on file    Attends meetings of clubs or organizations: Not on file    Relationship status: Not on file  Other Topics Concern  . Not on file  Social History Narrative   Patient lives at home with his family.   Caffeine Use: 2-3 cups daily    Vitals:   08/09/18 1142  BP: 120/68  Pulse: 82  Resp: 12  Temp: 98 F (36.7 C)  SpO2: 93%    Body mass index is 30.93 kg/m.   Physical Exam  Nursing note and vitals reviewed. Constitutional: He is oriented to person, place, and time. He appears well-developed. No distress.  HENT:  Head: Normocephalic and atraumatic.  Mouth/Throat: Oropharynx is clear and moist and mucous membranes are normal.  Eyes: Pupils are equal, round, and reactive to light. Conjunctivae are normal.  Neck: No thyromegaly present.  Cardiovascular: Normal rate and regular rhythm.  No murmur heard. Pulses:      Dorsalis pedis pulses are 2+ on the right side and 2+ on the left side.  Respiratory: Effort normal and breath sounds normal. No respiratory distress.  GI: Soft. He exhibits no mass. There is no hepatomegaly. There is no abdominal tenderness.  Musculoskeletal:        General: No edema.  Lymphadenopathy:    He has no cervical adenopathy.  Neurological: He is alert and oriented to person, place, and time. He has normal strength. No cranial nerve deficit. Gait normal.  Skin: Skin is  warm. No rash noted. No erythema.  Psychiatric: His mood appears anxious. Cognition and memory are normal.  Well groomed, good eye contact.    ASSESSMENT AND PLAN:  Mr. Brandon Madden was seen today for establish care.  Diagnoses and all orders for this visit:  Hyperlipidemia, unspecified hyperlipidemia type Continue Crestor 10 mg daily. Low-fat diet also recommended. Further recommendation will be given according to lipid panel results.  -     Comprehensive metabolic panel; Future -     Lipid panel; Future  Elevated glucose level Healthy lifestyle for primary prevention of diabetes. We may consider metformin, depending of A1c/insulin numbers. For now I do not recommend checking BS at home, this may aggravate anxiety.  We can continue following on A1c and FG every 3 to 4 months.  -     Comprehensive metabolic panel; Future -     Hemoglobin A1c; Future -     VITAMIN D 25 Hydroxy (Vit-D Deficiency, Fractures); Future -     Insulin, Free (Bioactive); Future  Elevated C-reactive protein (CRP) Unspecific. Will repeat lab and give recommendations accordingly.  -     C-reactive protein; Future  Pre-syncope Chronic. ?  Anxiety. Referral to neurologist placed today as requested.  -     Ambulatory referral to Neurology  Obesity (BMI 30.0-34.9) We discussed natural history of obesity and treatment options. CV impact of abdominal fat.  Reporting abnormal LFTs,?  Fatty liver. We have a long discussion about CVD risk and emphasize on those he can control. Recommend continuing with regular physical activity and try to be consistent with a healthful diet. I do not think pharmacologic treatment is necessary at this time.  Vitamin D deficiency, unspecified No changes in current management. Further recommendation will be given according to 25 OH vitamin D results.  Is coming next week for fasting labs. Return After lab results if abnormalities., for labs next week.      G. [place],  MD  [facility]. [place] office.

## 2018-08-14 ENCOUNTER — Other Ambulatory Visit (INDEPENDENT_AMBULATORY_CARE_PROVIDER_SITE_OTHER): Payer: BC Managed Care – PPO

## 2018-08-14 ENCOUNTER — Other Ambulatory Visit: Payer: Self-pay

## 2018-08-14 DIAGNOSIS — E785 Hyperlipidemia, unspecified: Secondary | ICD-10-CM | POA: Diagnosis not present

## 2018-08-14 DIAGNOSIS — E559 Vitamin D deficiency, unspecified: Secondary | ICD-10-CM

## 2018-08-14 DIAGNOSIS — R7309 Other abnormal glucose: Secondary | ICD-10-CM | POA: Diagnosis not present

## 2018-08-14 DIAGNOSIS — R7982 Elevated C-reactive protein (CRP): Secondary | ICD-10-CM | POA: Diagnosis not present

## 2018-08-14 LAB — COMPREHENSIVE METABOLIC PANEL
ALT: 34 U/L (ref 0–53)
AST: 29 U/L (ref 0–37)
Albumin: 4.4 g/dL (ref 3.5–5.2)
Alkaline Phosphatase: 60 U/L (ref 39–117)
BUN: 18 mg/dL (ref 6–23)
CO2: 28 mEq/L (ref 19–32)
Calcium: 9.3 mg/dL (ref 8.4–10.5)
Chloride: 103 mEq/L (ref 96–112)
Creatinine, Ser: 1.06 mg/dL (ref 0.40–1.50)
GFR: 72.05 mL/min (ref >60.00)
Glucose, Bld: 98 mg/dL (ref 70–99)
Potassium: 4.6 mEq/L (ref 3.5–5.1)
Sodium: 138 mEq/L (ref 135–145)
Total Bilirubin: 0.6 mg/dL (ref 0.2–1.2)
Total Protein: 6.9 g/dL (ref 6.0–8.3)

## 2018-08-14 LAB — LIPID PANEL
Cholesterol: 219 mg/dL — ABNORMAL HIGH (ref 0–200)
HDL: 53.8 mg/dL (ref >39.00)
LDL Cholesterol: 151 mg/dL — ABNORMAL HIGH (ref 0–99)
NonHDL: 165.43
Total CHOL/HDL Ratio: 4
Triglycerides: 74 mg/dL (ref 0.0–149.0)
VLDL: 14.8 mg/dL (ref 0.0–40.0)

## 2018-08-14 LAB — HEMOGLOBIN A1C: Hgb A1c MFr Bld: 5.6 % (ref 4.6–6.5)

## 2018-08-14 LAB — VITAMIN D 25 HYDROXY (VIT D DEFICIENCY, FRACTURES): VITD: 45.1 ng/mL (ref 30.00–100.00)

## 2018-08-14 LAB — C-REACTIVE PROTEIN: CRP: <1 mg/dL (ref 0.5–20.0)

## 2018-08-19 ENCOUNTER — Ambulatory Visit: Payer: BC Managed Care – PPO | Admitting: Family Medicine

## 2018-08-19 ENCOUNTER — Other Ambulatory Visit: Payer: Self-pay

## 2018-08-20 ENCOUNTER — Ambulatory Visit (INDEPENDENT_AMBULATORY_CARE_PROVIDER_SITE_OTHER): Payer: BC Managed Care – PPO | Admitting: Family Medicine

## 2018-08-20 ENCOUNTER — Other Ambulatory Visit: Payer: Self-pay

## 2018-08-20 DIAGNOSIS — E785 Hyperlipidemia, unspecified: Secondary | ICD-10-CM | POA: Insufficient documentation

## 2018-08-20 DIAGNOSIS — E559 Vitamin D deficiency, unspecified: Secondary | ICD-10-CM | POA: Diagnosis not present

## 2018-08-20 HISTORY — DX: Hyperlipidemia, unspecified: E78.5

## 2018-08-20 NOTE — Progress Notes (Signed)
Virtual Visit via Telephone Note  I connected with Brandon Madden on 08/22/18 at  8:30 AM EDT by telephone and verified that I am speaking with the correct person using two identifiers.   I discussed the limitations, risks, security and privacy concerns of performing an evaluation and management service by telephone and the availability of in person appointments. I also discussed with the patient that there may be a patient responsible charge related to this service. The patient expressed understanding and agreed to proceed.  Location patient: home Location provider: home office Participants present for the call: patient, provider Patient did not have a visit in the prior 7 days to address this/these issue(s).   History of Present Illness:  Brandon Madden was last seen in the office on 08/09/18,very concerned about his CVD risk factors. He had labs recently and wanted to arrange appt to discuss lab results in detail and his risk of having a CV event.  His father died at age 58, afraid of him having same problem.  HLD,he is on Crestor 10 mg daily. He follows low fat diet.  Denies severe/frequent headache, visual changes, chest pain, dyspnea, palpitation, claudication, focal weakness, or edema.  Prediabetes. Denies abdominal pain, nausea,vomiting, polydipsia,polyuria, or polyphagia.   Lab Results  Component Value Date   CHOL 219 (H) 08/14/2018   HDL 53.80 08/14/2018   LDLCALC 151 (H) 08/14/2018   TRIG 74.0 08/14/2018   CHOLHDL 4 08/14/2018   Lab Results  Component Value Date   HGBA1C 5.6 08/14/2018   Insulin level is pending at this time.  Vit D deficiency ,taking vit D in his daily multivitamins. 25 OH vit D normal at 45.1.  CRP was also elevated in 2014, it is now in normal range.  Observations/Objective: Patient sounds cheerful and well on the phone. I do not appreciate any SOB. Speech and thought processing are grossly intact. Very anxious. Patient reported  vitals:N/A  Assessment and Plan:  1. Vitamin D deficiency, unspecified Well controlled. No changes in current management.  2. Hyperlipidemia, unspecified hyperlipidemia type We discussed results + CV risk factors +prevention. Recommend LDL < 100 in order to decrease CV risk and to re-check FLP in 4 months. Benefits of statin meds discussed. He does not want to increase dose of Crestor. FLP in 4 months. HgA1C slightly better, it was 5.8. Encouraged to continue a healthy life stile. HgA1C can be checked annually.   Dscussed lab results and inquired about normal ranges one by one. Also instructed   Follow Up Instructions:  F/U in 4 months, labs before OV.   I did not refer this patient for an OV in the next 24 hours for this/these issue(s).  I discussed the assessment and treatment plan with the patient. He was provided an opportunity to ask questions and all were answered. The patient agreed with the plan and demonstrated an understanding of the instructions.    I provided 12 minutes of non-face-to-face time during this encounter.   Betty Martinique, MD

## 2018-08-21 LAB — INSULIN, FREE (BIOACTIVE): Insulin, Free: 12.5 u[IU]/mL (ref 1.5–14.9)

## 2018-08-22 ENCOUNTER — Encounter: Payer: Self-pay | Admitting: Family Medicine

## 2018-08-22 MED ORDER — ROSUVASTATIN CALCIUM 10 MG PO TABS: 10.0000 mg | ORAL_TABLET | Freq: Every day | ORAL | 2 refills | Status: DC

## 2018-08-28 ENCOUNTER — Telehealth: Payer: Self-pay | Admitting: *Deleted

## 2018-08-28 ENCOUNTER — Other Ambulatory Visit: Payer: Self-pay

## 2018-08-28 ENCOUNTER — Encounter: Payer: Self-pay | Admitting: Family Medicine

## 2018-08-28 ENCOUNTER — Ambulatory Visit (INDEPENDENT_AMBULATORY_CARE_PROVIDER_SITE_OTHER): Payer: BC Managed Care – PPO | Admitting: Family Medicine

## 2018-08-28 DIAGNOSIS — Z20828 Contact with and (suspected) exposure to other viral communicable diseases: Secondary | ICD-10-CM | POA: Diagnosis not present

## 2018-08-28 DIAGNOSIS — Z209 Contact with and (suspected) exposure to unspecified communicable disease: Secondary | ICD-10-CM

## 2018-08-28 NOTE — Progress Notes (Signed)
   Subjective:    Patient ID: Brandon Madden, male    DOB: 1960/12/19, 58 y.o.   MRN: [id number]  HPI Virtual Visit via Video Note  I connected with the patient on 08/28/18 at  4:15 PM EDT by a video enabled telemedicine application and verified that I am speaking with the correct person using two identifiers.  Location patient: home Location provider:work or home office Persons participating in the virtual visit: patient, provider  I discussed the limitations of evaluation and management by telemedicine and the availability of in person appointments. The patient expressed understanding and agreed to proceed.   HPI: Here asking to be tested for the Covid-19 virus. He says that his niece spent the night with his family last weekend, and the niece found out that her friend (who she has been spending time with) recently tested positive for the virus. Now the niece has had several days of fevers, headaches, and a dry cough. The niece was tested yesterday for the virus. Now Ramondo, his wife, and their 3 children want to be tested. So far none of them have any symptoms.    ROS: See pertinent positives and negatives per HPI.  Past Medical History:  Diagnosis Date  . Deafness in right ear   . Family history of pancreatic cancer   . Family history of prostate cancer   . Family history of skin cancer   . Sleep apnea     Past Surgical History:  Procedure Laterality Date  . NASAL SEPTUM SURGERY      Family History  Problem Relation Age of Onset  . Heart attack Father   . Pancreatic cancer Maternal Grandmother        dx 32s, d 40s  . Skin cancer Sister   . Prostate cancer Brother        dx 54  . Prostate cancer Brother        dx 58, metastatic, d 38     Current Outpatient Medications:  .  rosuvastatin (CRESTOR) 10 MG tablet, Take 1 tablet (10 mg total) by mouth daily., Disp: 90 tablet, Rfl: 2  EXAM:  VITALS per patient if applicable:  GENERAL: alert, oriented, appears well and  in no acute distress  HEENT: atraumatic, conjunttiva clear, no obvious abnormalities on inspection of external nose and ears  NECK: normal movements of the head and neck  LUNGS: on inspection no signs of respiratory distress, breathing rate appears normal, no obvious gross SOB, gasping or wheezing  CV: no obvious cyanosis  MS: moves all visible extremities without noticeable abnormality  PSYCH/NEURO: pleasant and cooperative, no obvious depression or anxiety, speech and thought processing grossly intact  ASSESSMENT AND PLAN: Possible exposure to Covid-19. We will arrange for him to be tested as well. He knows to let us know if he develops any symptoms.  Alysia Penna, MD  Discussed the following assessment and plan:  No diagnosis found.     I discussed the assessment and treatment plan with the patient. The patient was provided an opportunity to ask questions and all were answered. The patient agreed with the plan and demonstrated an understanding of the instructions.   The patient was advised to call back or seek an in-person evaluation if the symptoms worsen or if the condition fails to improve as anticipated.     Review of Systems     Objective:   Physical Exam        Assessment & Plan:

## 2018-08-28 NOTE — Telephone Encounter (Signed)
Copied from [place] [phone]. Topic: General - Other >> Aug 28, 2018  1:12 PM Keene Breath wrote: Reason for CRM: Patient called to ask if he should get a COVID test since he has had some contact with a person who possibly has COVID.  Patient would like advice from the nurse on what would be the best thing to do.  CB# [phone]   Clinic RN contacted patient. Scheduled patient and his wife a virtual visit with Dr. Sarajane Jews at [facility].

## 2018-08-29 ENCOUNTER — Telehealth: Payer: Self-pay | Admitting: *Deleted

## 2018-08-29 ENCOUNTER — Other Ambulatory Visit: Payer: BC Managed Care – PPO

## 2018-08-29 DIAGNOSIS — Z20822 Contact with and (suspected) exposure to covid-19: Secondary | ICD-10-CM

## 2018-08-29 DIAGNOSIS — R6889 Other general symptoms and signs: Secondary | ICD-10-CM | POA: Diagnosis not present

## 2018-08-29 NOTE — Telephone Encounter (Signed)
-----   Message from Elie Confer, Fries sent at 08/29/2018  8:09 AM EDT ----- Good morning!!  Dr. Sarajane Jews would like to have this patient tested for covid 19.  Patient had an positive exposure a week ago.  Thank you

## 2018-08-29 NOTE — Telephone Encounter (Signed)
Scheduled patient for COVID 19 test today at [facility].  Testing protocol reviewed.

## 2018-09-03 LAB — NOVEL CORONAVIRUS, NAA: SARS-CoV-2, NAA: NOT DETECTED

## 2018-09-12 ENCOUNTER — Encounter: Payer: Self-pay | Admitting: Family Medicine

## 2018-10-31 ENCOUNTER — Encounter: Payer: Self-pay | Admitting: Family Medicine

## 2018-11-13 ENCOUNTER — Encounter: Payer: Self-pay | Admitting: Family Medicine

## 2018-11-19 ENCOUNTER — Other Ambulatory Visit: Payer: Self-pay | Admitting: Family Medicine

## 2018-11-19 ENCOUNTER — Other Ambulatory Visit: Payer: Self-pay

## 2018-11-19 DIAGNOSIS — R5383 Other fatigue: Secondary | ICD-10-CM

## 2018-11-19 DIAGNOSIS — Z0189 Encounter for other specified special examinations: Secondary | ICD-10-CM

## 2018-11-19 NOTE — Telephone Encounter (Signed)
Please advise how soon he can have labs drawn and I will call to make his appointment. Thank you

## 2018-12-04 ENCOUNTER — Encounter: Payer: Self-pay | Admitting: *Deleted

## 2018-12-18 ENCOUNTER — Other Ambulatory Visit (INDEPENDENT_AMBULATORY_CARE_PROVIDER_SITE_OTHER): Payer: BC Managed Care – PPO

## 2018-12-18 ENCOUNTER — Other Ambulatory Visit: Payer: Self-pay

## 2018-12-18 DIAGNOSIS — R5383 Other fatigue: Secondary | ICD-10-CM | POA: Diagnosis not present

## 2018-12-18 DIAGNOSIS — Z0189 Encounter for other specified special examinations: Secondary | ICD-10-CM | POA: Diagnosis not present

## 2018-12-20 LAB — TESTOSTERONE: Testosterone: 321.05 ng/dL (ref 300.00–890.00)

## 2018-12-20 LAB — TSH: TSH: 1.89 u[IU]/mL (ref 0.35–4.50)

## 2018-12-23 ENCOUNTER — Encounter: Payer: Self-pay | Admitting: Family Medicine

## 2019-01-09 ENCOUNTER — Other Ambulatory Visit: Payer: Self-pay

## 2019-01-09 DIAGNOSIS — Z20822 Contact with and (suspected) exposure to covid-19: Secondary | ICD-10-CM

## 2019-01-11 LAB — NOVEL CORONAVIRUS, NAA: SARS-CoV-2, NAA: NOT DETECTED

## 2019-01-23 ENCOUNTER — Encounter: Payer: Self-pay | Admitting: Family Medicine

## 2019-03-06 ENCOUNTER — Ambulatory Visit: Payer: Self-pay | Attending: Internal Medicine

## 2019-03-06 ENCOUNTER — Other Ambulatory Visit: Payer: BC Managed Care – PPO

## 2019-03-06 DIAGNOSIS — Z20822 Contact with and (suspected) exposure to covid-19: Secondary | ICD-10-CM

## 2019-03-08 LAB — NOVEL CORONAVIRUS, NAA: SARS-CoV-2, NAA: NOT DETECTED

## 2019-03-10 ENCOUNTER — Encounter: Payer: Self-pay | Admitting: Family Medicine

## 2019-03-10 DIAGNOSIS — R7303 Prediabetes: Secondary | ICD-10-CM | POA: Diagnosis not present

## 2019-03-10 DIAGNOSIS — E669 Obesity, unspecified: Secondary | ICD-10-CM

## 2019-03-10 DIAGNOSIS — R5383 Other fatigue: Secondary | ICD-10-CM | POA: Diagnosis not present

## 2019-03-10 DIAGNOSIS — R635 Abnormal weight gain: Secondary | ICD-10-CM | POA: Diagnosis not present

## 2019-03-10 DIAGNOSIS — E291 Testicular hypofunction: Secondary | ICD-10-CM | POA: Diagnosis not present

## 2019-03-10 DIAGNOSIS — R7989 Other specified abnormal findings of blood chemistry: Secondary | ICD-10-CM

## 2019-03-13 ENCOUNTER — Telehealth: Payer: Self-pay | Admitting: Family Medicine

## 2019-03-18 DIAGNOSIS — Z6831 Body mass index (BMI) 31.0-31.9, adult: Secondary | ICD-10-CM | POA: Diagnosis not present

## 2019-03-18 DIAGNOSIS — E291 Testicular hypofunction: Secondary | ICD-10-CM | POA: Diagnosis not present

## 2019-03-18 DIAGNOSIS — Z1339 Encounter for screening examination for other mental health and behavioral disorders: Secondary | ICD-10-CM | POA: Diagnosis not present

## 2019-03-18 DIAGNOSIS — Z1331 Encounter for screening for depression: Secondary | ICD-10-CM | POA: Diagnosis not present

## 2019-03-18 DIAGNOSIS — E7800 Pure hypercholesterolemia, unspecified: Secondary | ICD-10-CM | POA: Diagnosis not present

## 2019-03-18 DIAGNOSIS — R7303 Prediabetes: Secondary | ICD-10-CM | POA: Diagnosis not present

## 2019-03-20 ENCOUNTER — Telehealth: Payer: Self-pay | Admitting: Neurology

## 2019-03-20 NOTE — Telephone Encounter (Signed)
Left a message on patient's VM letting him know

## 2019-03-20 NOTE — Telephone Encounter (Signed)
Pt called stating that when he was getting life insurance they told him that he was supposed to had followed up with his Neurologist. Pt has not been here in 5 years and would like to know if he will have to start the process all over again or how he can go about this being taken care of. Please advise.

## 2019-03-20 NOTE — Telephone Encounter (Signed)
Per office rule patient will need a new patient appointment since he has not been seen in 5 years.

## 2019-03-25 DIAGNOSIS — Z6831 Body mass index (BMI) 31.0-31.9, adult: Secondary | ICD-10-CM | POA: Diagnosis not present

## 2019-03-25 DIAGNOSIS — R7303 Prediabetes: Secondary | ICD-10-CM | POA: Diagnosis not present

## 2019-04-01 DIAGNOSIS — Z6831 Body mass index (BMI) 31.0-31.9, adult: Secondary | ICD-10-CM | POA: Diagnosis not present

## 2019-04-01 DIAGNOSIS — R7303 Prediabetes: Secondary | ICD-10-CM | POA: Diagnosis not present

## 2019-04-08 DIAGNOSIS — Z6831 Body mass index (BMI) 31.0-31.9, adult: Secondary | ICD-10-CM | POA: Diagnosis not present

## 2019-04-08 DIAGNOSIS — E7800 Pure hypercholesterolemia, unspecified: Secondary | ICD-10-CM | POA: Diagnosis not present

## 2019-04-15 DIAGNOSIS — Z6831 Body mass index (BMI) 31.0-31.9, adult: Secondary | ICD-10-CM | POA: Diagnosis not present

## 2019-04-15 DIAGNOSIS — R7303 Prediabetes: Secondary | ICD-10-CM | POA: Diagnosis not present

## 2019-04-15 DIAGNOSIS — E7800 Pure hypercholesterolemia, unspecified: Secondary | ICD-10-CM | POA: Diagnosis not present

## 2019-04-22 DIAGNOSIS — E291 Testicular hypofunction: Secondary | ICD-10-CM | POA: Diagnosis not present

## 2019-04-22 DIAGNOSIS — Z6830 Body mass index (BMI) 30.0-30.9, adult: Secondary | ICD-10-CM | POA: Diagnosis not present

## 2019-04-22 DIAGNOSIS — R5383 Other fatigue: Secondary | ICD-10-CM | POA: Diagnosis not present

## 2019-04-22 DIAGNOSIS — R7303 Prediabetes: Secondary | ICD-10-CM | POA: Diagnosis not present

## 2019-04-22 DIAGNOSIS — G479 Sleep disorder, unspecified: Secondary | ICD-10-CM | POA: Diagnosis not present

## 2019-04-27 ENCOUNTER — Encounter: Payer: Self-pay | Admitting: Family Medicine

## 2019-04-27 DIAGNOSIS — R55 Syncope and collapse: Secondary | ICD-10-CM

## 2019-04-29 DIAGNOSIS — E291 Testicular hypofunction: Secondary | ICD-10-CM | POA: Diagnosis not present

## 2019-04-29 DIAGNOSIS — Z6830 Body mass index (BMI) 30.0-30.9, adult: Secondary | ICD-10-CM | POA: Diagnosis not present

## 2019-04-29 DIAGNOSIS — G479 Sleep disorder, unspecified: Secondary | ICD-10-CM | POA: Diagnosis not present

## 2019-04-29 DIAGNOSIS — E7800 Pure hypercholesterolemia, unspecified: Secondary | ICD-10-CM | POA: Diagnosis not present

## 2019-05-05 ENCOUNTER — Encounter: Payer: Self-pay | Admitting: Family Medicine

## 2019-05-06 DIAGNOSIS — Z6830 Body mass index (BMI) 30.0-30.9, adult: Secondary | ICD-10-CM | POA: Diagnosis not present

## 2019-05-06 DIAGNOSIS — R7303 Prediabetes: Secondary | ICD-10-CM | POA: Diagnosis not present

## 2019-05-12 ENCOUNTER — Institutional Professional Consult (permissible substitution): Payer: BLUE CROSS/BLUE SHIELD | Admitting: Neurology

## 2019-05-13 DIAGNOSIS — Z6830 Body mass index (BMI) 30.0-30.9, adult: Secondary | ICD-10-CM | POA: Diagnosis not present

## 2019-05-13 DIAGNOSIS — E7800 Pure hypercholesterolemia, unspecified: Secondary | ICD-10-CM | POA: Diagnosis not present

## 2019-05-13 DIAGNOSIS — R7303 Prediabetes: Secondary | ICD-10-CM | POA: Diagnosis not present

## 2019-05-14 ENCOUNTER — Ambulatory Visit: Payer: Self-pay | Attending: Internal Medicine

## 2019-05-14 DIAGNOSIS — Z20822 Contact with and (suspected) exposure to covid-19: Secondary | ICD-10-CM | POA: Diagnosis not present

## 2019-05-15 ENCOUNTER — Encounter: Payer: Self-pay | Admitting: Family Medicine

## 2019-05-15 LAB — SARS-COV-2, NAA 2 DAY TAT

## 2019-05-15 LAB — NOVEL CORONAVIRUS, NAA: SARS-CoV-2, NAA: NOT DETECTED

## 2019-05-19 ENCOUNTER — Other Ambulatory Visit: Payer: Self-pay

## 2019-05-19 ENCOUNTER — Ambulatory Visit: Payer: BC Managed Care – PPO | Admitting: Neurology

## 2019-05-19 ENCOUNTER — Encounter: Payer: Self-pay | Admitting: Neurology

## 2019-05-19 VITALS — BP 121/75 | HR 71 | Ht 66.0 in | Wt 191.0 lb

## 2019-05-19 DIAGNOSIS — R42 Dizziness and giddiness: Secondary | ICD-10-CM

## 2019-05-19 NOTE — Progress Notes (Signed)
Subjective:    Patient ID: Brandon Madden is a 59 y.o. male.  HPI     Huston Foley, MD, PhD [facility] [address] [address] [place], [place] [address]  Dear Dr. [place],    I saw your patient, Brandon Madden, upon your kind request in my neurologic clinic today for initial consultation of his presyncopal spells.  The patient is unaccompanied today.  As you know, Mr. Brandon Madden is a 59 year old right-handed gentleman with an underlying medical history of hyperlipidemia, vitamin D deficiency, hearing loss, allergic rhinitis, obesity, and sleep apnea, who reports intermittent dizziness or lightheadedness lasting anywhere from 30 seconds to 2 minutes.  The symptoms have recurred since 2002.  They are not progressive, in fact they are infrequent, happens every 6 to 12 months at this point.  He does have a drained feeling afterwards and feels tired, has a throbbing fairly generalized headache that is not debilitating but enough to take Advil or Tylenol at the time, some nausea at times, no vomiting, no photophobia, not one-sided, no other neurological accompaniments, no history of loss of consciousness or fall with these, no involuntary movements or loss of time.    He has been prediabetic for some years, wonders if sometimes the blood sugar fluctuates and causes him to feel like this.  He certainly has not had any new neurological symptoms.  He is here to round out his evaluation from 6 years ago.  He has not had any new symptoms since then.  He does not have any visual aura in keeping with migraine aura.  His sister does have migraine headaches.  He hydrates well with water, he is working on weight loss, he goes to [employer] for this.  He denies any orthostatic lightheadedness.  I have previously evaluated him for intermittent dizzy spells and headaches.  He was seen several years ago for this and reported episodic lightheadedness and dizziness as well as nausea and headache.   He was advised to proceed with an EEG and brain MRI. He had an EEG on 11/19/2012 and I reviewed the results: CONCLUSION: This is a  normal awake and asleep EEG.  There is no electrodiagnostic evidence of epileptiform discharge.   He had a brain MRI with and without contrast on 11/20/2012 and I reviewed the results: IMPRESSION:  Equivocal MRI brain (with and without) demonstrating minimal periventricular gliosis, with a single punctate right frontal juxtacortical focus of gliosis. Otherwise unremarkable study.   For his sleep apnea he is on CPAP therapy and reports being compliant with treatment, he has a sleep specialist.   Previously:   (He) has a several year history of intermittent lightheadedness and dizziness associated with nausea followed by a headache. His first episode was in 2002 or 2003, and consisted of a sensation coming on in his head and it feels, as if he is about to pass out. He becomes pale, does not feel like talking, but there is no slurring of speech. No change in vision, as in, no diplopia, no constriction in his VF, no blurry vision. He has associated nausea. This lasts for 1-4 minutes, and is followed by a generalized headache, which a tightness-like sensation, no photo or sonophobia, no N/V at the time. He HA lasts all day. He feels very exhausted afterwards.  Frequency was very sporadic, every other year and he had a very extensive, mostly cardiac w/u at the time. In the last 7 weeks he has had 2 episodes, which is  unusual and disturbing to him. He does not smoke or drink, no additional stressors, no changes in meds, no lifestyle changes.  His 30 yo son has epilepsy since infancy. He himself has never had a twitching spell or convulsion or staring spell.  He has a FHx of migraine only in one sister; he is one of 10 children. Of note, he has R ear hearing loss since infancy; he started having a tuft of grey hair since age 66 and was suspected to have Waardenburg Syndrome.  He  does not remember if he had an MRI brain or EEG in the past. He has had mild memory loss. He moved to [place] in 6/12. 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 He has a FHx of AD in his father and both paternal grandparents. Father passed away at age 38 with a sudden MI. Mother is 26 and in good health. No FHx of stroke. Never had TIA or stroke symptoms, denying sudden onset of one sided weakness, numbness, tingling, slurring of speech or droopy face, new hearing loss, tinnitus, diplopia or visual field cut or monocular loss of vision.   His Past Medical History Is Significant For: Past Medical History:  Diagnosis Date  . Deafness in right ear   . Family history of pancreatic cancer   . Family history of prostate cancer   . Family history of skin cancer   . Sleep apnea     His Past Surgical History Is Significant For: Past Surgical History:  Procedure Laterality Date  . NASAL SEPTUM SURGERY      His Family History Is Significant For: Family History  Problem Relation Age of Onset  . Heart attack Father   . Pancreatic cancer Maternal Grandmother        dx 36s, d 1s  . Skin cancer Sister   . Prostate cancer Brother        dx 14  . Prostate cancer Brother        dx 47, metastatic, d 60    His Social History Is Significant For: Social History   Socioeconomic History  . Marital status: Married    Spouse name: [name]  . Number of children: 4  . Years of education: Law  . Highest education level: Not on file  Occupational History    Comment: [employer]  Tobacco Use  . Smoking status: Never Smoker  . Smokeless tobacco: Never Used  Substance and Sexual Activity  . Alcohol use: No    Comment: quit: 2011  . Drug use: No  . Sexual activity: Not on file  Other Topics Concern  . Not on file  Social History Narrative   Patient lives at home with his family.   Caffeine Use: 2-3 cups daily   Social Determinants of Health   Financial Resource Strain:   . Difficulty of Paying Living  Expenses:   Food Insecurity:   . Worried About Programme researcher, broadcasting/film/video in the Last Year:   . Barista in the Last Year:   Transportation Needs:   . Freight forwarder (Medical):   Marland Kitchen Lack of Transportation (Non-Medical):   Physical Activity:   . Madden of Exercise per Week:   . Minutes of Exercise per Session:   Stress:   . Feeling of Stress :   Social Connections:   . Frequency of Communication with Friends and Family:   . Frequency of Social Gatherings with Friends and Family:   . Attends Religious  Services:   . Active Member of Clubs or Organizations:   . Attends Archivist Meetings:   Marland Kitchen Marital Status:     His Allergies Are:  Allergies  Allergen Reactions  . Bee Venom   :   His Current Medications Are:  Outpatient Encounter Medications as of 05/19/2019  Medication Sig  . rosuvastatin (CRESTOR) 10 MG tablet Take 1 tablet (10 mg total) by mouth daily.  Marland Kitchen UNABLE TO FIND Med Name: Testosterone PO   No facility-administered encounter medications on file as of 05/19/2019.  :   Review of Systems:  Out of a complete 14 point review of systems, all are reviewed and negative with the exception of these symptoms as listed below: Review of Systems  Neurological:       Pt presents today to discuss his pre syncopal spells. He has one every 6-9 months. Pt does not lose consciousness.    Objective:  Neurological Exam  Physical Exam Physical Examination:   Vitals:   05/19/19 0925  BP: 121/75  Pulse: 71   On orthostatic testing: Blood pressure and pulse 118/74 with a pulse of 69, sitting 127/75 with a pulse of 71, standing 117/76 with a pulse of 87.  He denies any orthostatic lightheadedness or vertiginous symptoms.  General Examination: The patient is a very pleasant 59 y.o. male in no acute distress. He appears well-developed and well-nourished and well groomed.   HEENT: Normocephalic, atraumatic, pupils are equal, round and reactive to light and  accommodation. Funduscopic exam is normal with sharp disc margins noted. Extraocular tracking is good without limitation to gaze excursion or nystagmus noted. Normal smooth pursuit is noted. Hearing is grossly intact. Face is symmetric with normal facial animation and normal facial sensation. Speech is clear with no dysarthria noted. There is no hypophonia. There is no lip, neck/head, jaw or voice tremor. Neck is supple with full range of passive and active motion. There are no carotid bruits on auscultation. Oropharynx exam reveals: mild mouth dryness, adequate dental hygiene. Tongue protrudes centrally and palate elevates symmetrically.   Chest: Clear to auscultation without wheezing, rhonchi or crackles noted.  Heart: S1+S2+0, regular and normal without murmurs, rubs or gallops noted.   Abdomen: Soft, non-tender and non-distended with normal bowel sounds appreciated on auscultation.  Extremities: There is no pitting edema in the distal lower extremities bilaterally.  Skin: Warm and dry without trophic changes noted.  Musculoskeletal: exam reveals no obvious joint deformities, tenderness or joint swelling or erythema.   Neurologically:  Mental status: The patient is awake, alert and oriented in all 4 spheres. His immediate and remote memory, attention, language skills and fund of knowledge are appropriate. There is no evidence of aphasia, agnosia, apraxia or anomia. Speech is clear with normal prosody and enunciation. Thought process is linear. Mood is normal and affect is normal.  Cranial nerves II - XII are as described above under HEENT exam. In addition: shoulder shrug is normal with equal shoulder height noted. Motor exam: Normal bulk, strength and tone is noted. There is no drift, tremor or rebound. Romberg is negative. Reflexes are 2+ throughout. Babinski: Toes are flexor bilaterally. Fine motor skills and coordination: intact with normal finger taps, normal hand movements, normal rapid  alternating patting, normal foot taps and normal foot agility.  Cerebellar testing: No dysmetria or intention tremor on finger to nose testing. Heel to shin is unremarkable bilaterally. There is no truncal or gait ataxia.  Sensory exam: intact to light touch, vibration, temperature  sense in the upper and lower extremities.  Gait, station and balance: He stands easily. No veering to one side is noted. No leaning to one side is noted. Posture is age-appropriate and stance is narrow based. Gait shows normal stride length and normal pace. No problems turning are noted. Tandem walk is unremarkable.   Assessment and Plan:   In summary, Jaremy Nosal is a very pleasant 59 y.o.-year old male with an underlying medical history of hyperlipidemia, vitamin D deficiency, hearing loss, allergic rhinitis, obesity, and sleep apnea, on PAP therapy, who presents for evaluation of his intermittent dizzy spells.  He had evaluation for this in the past, and I had seen him some 6 years ago when we did a brain MRI as well as an EEG, both benign.  He had a normal neurological exam then.  Differential diagnosis may include migraines but they are mild and very infrequent, do not warrant any prescription medications, as he has relief with prn Advil or Tylenol, certainly no migraine preventative at this time.  He does have a family history of migraines.  He does not have any orthostatic hypotension, history is not suggestive of any other neurologic cause.  His neurological exam is normal and reassuring today and I do see a reason to repeat his brain MRI or EEG at this time.  He is largely reassured today.  He is reminded to stay well-hydrated, well rested and stay compliant with his CPAP.  He is commended for his weight loss endeavors. He is followed for his sleep apnea by a sleep specialist and reports full compliance with his CPAP.  At this juncture, he does not need ongoing neurological follow-up.  He is advised to follow-up with you  as scheduled routinely. I answered all his questions today and the patient was in agreement.  Thank you very much for allowing me to participate in the care of this nice patient and to see him again. If I can be of any further assistance to you please do not hesitate to call me at [phone].  Sincerely,   Huston Foley, MD, PhD

## 2019-05-19 NOTE — Patient Instructions (Addendum)
You may have infrequent and milder migraines with aura.  Your neurological exam is normal and stable.  You had a benign brain MRI in 2014 and there is no pressing reason to repeat your scan.  You had a normal EEG (brainwave test) in 2014 as well.  You don't need to follow up in neurology at this point.

## 2019-05-20 DIAGNOSIS — R7303 Prediabetes: Secondary | ICD-10-CM | POA: Diagnosis not present

## 2019-05-20 DIAGNOSIS — Z6830 Body mass index (BMI) 30.0-30.9, adult: Secondary | ICD-10-CM | POA: Diagnosis not present

## 2019-05-27 DIAGNOSIS — Z6830 Body mass index (BMI) 30.0-30.9, adult: Secondary | ICD-10-CM | POA: Diagnosis not present

## 2019-05-27 DIAGNOSIS — E7800 Pure hypercholesterolemia, unspecified: Secondary | ICD-10-CM | POA: Diagnosis not present

## 2019-06-03 DIAGNOSIS — Z6829 Body mass index (BMI) 29.0-29.9, adult: Secondary | ICD-10-CM | POA: Diagnosis not present

## 2019-06-03 DIAGNOSIS — R7303 Prediabetes: Secondary | ICD-10-CM | POA: Diagnosis not present

## 2019-06-10 DIAGNOSIS — E7800 Pure hypercholesterolemia, unspecified: Secondary | ICD-10-CM | POA: Diagnosis not present

## 2019-06-10 DIAGNOSIS — Z6829 Body mass index (BMI) 29.0-29.9, adult: Secondary | ICD-10-CM | POA: Diagnosis not present

## 2019-06-10 DIAGNOSIS — R7303 Prediabetes: Secondary | ICD-10-CM | POA: Diagnosis not present

## 2019-06-26 DIAGNOSIS — Z6829 Body mass index (BMI) 29.0-29.9, adult: Secondary | ICD-10-CM | POA: Diagnosis not present

## 2019-06-26 DIAGNOSIS — E291 Testicular hypofunction: Secondary | ICD-10-CM | POA: Diagnosis not present

## 2019-06-26 DIAGNOSIS — R7303 Prediabetes: Secondary | ICD-10-CM | POA: Diagnosis not present

## 2019-07-08 DIAGNOSIS — E7800 Pure hypercholesterolemia, unspecified: Secondary | ICD-10-CM | POA: Diagnosis not present

## 2019-07-08 DIAGNOSIS — E291 Testicular hypofunction: Secondary | ICD-10-CM | POA: Diagnosis not present

## 2019-07-08 DIAGNOSIS — Z6828 Body mass index (BMI) 28.0-28.9, adult: Secondary | ICD-10-CM | POA: Diagnosis not present

## 2019-07-08 DIAGNOSIS — R7303 Prediabetes: Secondary | ICD-10-CM | POA: Diagnosis not present

## 2019-07-24 DIAGNOSIS — E7800 Pure hypercholesterolemia, unspecified: Secondary | ICD-10-CM | POA: Diagnosis not present

## 2019-07-24 DIAGNOSIS — Z6829 Body mass index (BMI) 29.0-29.9, adult: Secondary | ICD-10-CM | POA: Diagnosis not present

## 2019-08-12 DIAGNOSIS — E7800 Pure hypercholesterolemia, unspecified: Secondary | ICD-10-CM | POA: Diagnosis not present

## 2019-08-12 DIAGNOSIS — Z713 Dietary counseling and surveillance: Secondary | ICD-10-CM | POA: Diagnosis not present

## 2019-08-12 DIAGNOSIS — Z6829 Body mass index (BMI) 29.0-29.9, adult: Secondary | ICD-10-CM | POA: Diagnosis not present

## 2019-09-02 DIAGNOSIS — Z6829 Body mass index (BMI) 29.0-29.9, adult: Secondary | ICD-10-CM | POA: Diagnosis not present

## 2019-09-02 DIAGNOSIS — R7303 Prediabetes: Secondary | ICD-10-CM | POA: Diagnosis not present

## 2019-10-14 DIAGNOSIS — Z6829 Body mass index (BMI) 29.0-29.9, adult: Secondary | ICD-10-CM | POA: Diagnosis not present

## 2019-10-14 DIAGNOSIS — R7301 Impaired fasting glucose: Secondary | ICD-10-CM | POA: Diagnosis not present

## 2019-10-14 DIAGNOSIS — E291 Testicular hypofunction: Secondary | ICD-10-CM | POA: Diagnosis not present

## 2019-10-14 DIAGNOSIS — E7800 Pure hypercholesterolemia, unspecified: Secondary | ICD-10-CM | POA: Diagnosis not present

## 2019-10-28 DIAGNOSIS — R7303 Prediabetes: Secondary | ICD-10-CM | POA: Diagnosis not present

## 2019-10-28 DIAGNOSIS — Z6829 Body mass index (BMI) 29.0-29.9, adult: Secondary | ICD-10-CM | POA: Diagnosis not present

## 2019-10-29 DIAGNOSIS — R5383 Other fatigue: Secondary | ICD-10-CM | POA: Diagnosis not present

## 2019-10-29 DIAGNOSIS — E291 Testicular hypofunction: Secondary | ICD-10-CM | POA: Diagnosis not present

## 2019-11-11 DIAGNOSIS — L03113 Cellulitis of right upper limb: Secondary | ICD-10-CM | POA: Diagnosis not present

## 2019-11-18 DIAGNOSIS — E291 Testicular hypofunction: Secondary | ICD-10-CM | POA: Diagnosis not present

## 2019-11-18 DIAGNOSIS — R7303 Prediabetes: Secondary | ICD-10-CM | POA: Diagnosis not present

## 2019-11-18 DIAGNOSIS — Z6829 Body mass index (BMI) 29.0-29.9, adult: Secondary | ICD-10-CM | POA: Diagnosis not present

## 2019-11-18 DIAGNOSIS — G479 Sleep disorder, unspecified: Secondary | ICD-10-CM | POA: Diagnosis not present

## 2019-12-02 DIAGNOSIS — E7800 Pure hypercholesterolemia, unspecified: Secondary | ICD-10-CM | POA: Diagnosis not present

## 2019-12-02 DIAGNOSIS — Z6830 Body mass index (BMI) 30.0-30.9, adult: Secondary | ICD-10-CM | POA: Diagnosis not present

## 2019-12-30 DIAGNOSIS — L258 Unspecified contact dermatitis due to other agents: Secondary | ICD-10-CM | POA: Diagnosis not present

## 2019-12-30 DIAGNOSIS — D225 Melanocytic nevi of trunk: Secondary | ICD-10-CM | POA: Diagnosis not present

## 2019-12-30 DIAGNOSIS — Z1283 Encounter for screening for malignant neoplasm of skin: Secondary | ICD-10-CM | POA: Diagnosis not present

## 2020-03-30 ENCOUNTER — Encounter: Payer: Self-pay | Admitting: Family Medicine

## 2020-03-30 ENCOUNTER — Other Ambulatory Visit: Payer: Self-pay

## 2020-03-30 ENCOUNTER — Ambulatory Visit: Payer: BC Managed Care – PPO

## 2020-03-30 ENCOUNTER — Ambulatory Visit (INDEPENDENT_AMBULATORY_CARE_PROVIDER_SITE_OTHER): Payer: BC Managed Care – PPO | Admitting: Family Medicine

## 2020-03-30 VITALS — BP 118/80 | HR 98 | Temp 97.9°F | Resp 16 | Ht 66.0 in | Wt 200.5 lb

## 2020-03-30 DIAGNOSIS — Z1159 Encounter for screening for other viral diseases: Secondary | ICD-10-CM

## 2020-03-30 DIAGNOSIS — E785 Hyperlipidemia, unspecified: Secondary | ICD-10-CM | POA: Diagnosis not present

## 2020-03-30 DIAGNOSIS — Z1329 Encounter for screening for other suspected endocrine disorder: Secondary | ICD-10-CM

## 2020-03-30 DIAGNOSIS — Z0000 Encounter for general adult medical examination without abnormal findings: Secondary | ICD-10-CM

## 2020-03-30 DIAGNOSIS — Z23 Encounter for immunization: Secondary | ICD-10-CM

## 2020-03-30 DIAGNOSIS — E559 Vitamin D deficiency, unspecified: Secondary | ICD-10-CM

## 2020-03-30 DIAGNOSIS — Z13228 Encounter for screening for other metabolic disorders: Secondary | ICD-10-CM

## 2020-03-30 DIAGNOSIS — R002 Palpitations: Secondary | ICD-10-CM

## 2020-03-30 DIAGNOSIS — R053 Chronic cough: Secondary | ICD-10-CM

## 2020-03-30 DIAGNOSIS — Z125 Encounter for screening for malignant neoplasm of prostate: Secondary | ICD-10-CM | POA: Diagnosis not present

## 2020-03-30 DIAGNOSIS — Z130 Encounter for screening for diseases of the blood and blood-forming organs and certain disorders involving the immune mechanism: Secondary | ICD-10-CM | POA: Diagnosis not present

## 2020-03-30 DIAGNOSIS — Z0189 Encounter for other specified special examinations: Secondary | ICD-10-CM

## 2020-03-30 LAB — COMPREHENSIVE METABOLIC PANEL
ALT: 52 U/L (ref 0–53)
AST: 37 U/L (ref 0–37)
Albumin: 4.1 g/dL (ref 3.5–5.2)
Alkaline Phosphatase: 74 U/L (ref 39–117)
BUN: 19 mg/dL (ref 6–23)
CO2: 27 mEq/L (ref 19–32)
Calcium: 8.9 mg/dL (ref 8.4–10.5)
Chloride: 104 mEq/L (ref 96–112)
Creatinine, Ser: 1.02 mg/dL (ref 0.40–1.50)
GFR: 81.03 mL/min (ref >60.00)
Glucose, Bld: 104 mg/dL — ABNORMAL HIGH (ref 70–99)
Potassium: 4.5 mEq/L (ref 3.5–5.1)
Sodium: 137 mEq/L (ref 135–145)
Total Bilirubin: 0.6 mg/dL (ref 0.2–1.2)
Total Protein: 6.8 g/dL (ref 6.0–8.3)

## 2020-03-30 LAB — TESTOSTERONE: Testosterone: 614.53 ng/dL (ref 300.00–890.00)

## 2020-03-30 LAB — HEMOGLOBIN A1C: Hgb A1c MFr Bld: 5.5 % (ref 4.6–6.5)

## 2020-03-30 LAB — LIPID PANEL
Cholesterol: 245 mg/dL — ABNORMAL HIGH (ref 0–200)
HDL: 51.5 mg/dL (ref >39.00)
LDL Cholesterol: 170 mg/dL — ABNORMAL HIGH (ref 0–99)
NonHDL: 193.92
Total CHOL/HDL Ratio: 5
Triglycerides: 121 mg/dL (ref 0.0–149.0)
VLDL: 24.2 mg/dL (ref 0.0–40.0)

## 2020-03-30 LAB — PSA: PSA: 0.9 ng/mL (ref 0.10–4.00)

## 2020-03-30 LAB — VITAMIN D 25 HYDROXY (VIT D DEFICIENCY, FRACTURES): VITD: 32.71 ng/mL (ref 30.00–100.00)

## 2020-03-30 LAB — TSH: TSH: 1.64 u[IU]/mL (ref 0.35–4.50)

## 2020-03-30 NOTE — Patient Instructions (Addendum)
A few things to remember from today's visit:   Routine general medical examination at a health care facility  Hyperlipidemia, unspecified hyperlipidemia type - Plan: Lipid panel  Vitamin D deficiency, unspecified - Plan: VITAMIN D 25 Hydroxy (Vit-D Deficiency, Fractures)  Patient request for diagnostic testing - Plan: Testosterone, Estrogens, total, Progesterone  Heart palpitations - Plan: TSH, EKG 12-Lead  Encounter for HCV screening test for low risk patient - Plan: Hepatitis C antibody screen  Prostate cancer screening - Plan: PSA(Must document that pt has been informed of limitations of PSA testing.)  Screening for endocrine, metabolic and immunity disorder - Plan: Comprehensive metabolic panel, Hemoglobin A1c  Cough, persistent - Plan: DG Chest 2 View  Please be sure medication list is accurate. If a new problem present, please set up appointment sooner than planned today.  At least 150 minutes of moderate exercise per week, daily brisk walking for 15-30 min is a good exercise option. Healthy diet low in saturated (animal) fats and sweets and consisting of fresh fruits and vegetables, lean meats such as fish and white chicken and whole grains.  - Vaccines:  Tdap vaccine every 10 years.  Shingles vaccine recommended at age 76, could be given after 59 years of age but not sure about insurance coverage.  Pneumonia vaccines: Pneumovax at 665   -Screening recommendations for low/normal risk males:  Screening for diabetes at age 43 and every 3 years. Earlier screening if cardiovascular risk factors.   Lipid screening at 35 and every 3 years. Screening starts in younger males with cardiovascular risk factors. N/A  Colon cancer screening is now at age 79 but your insurance may not cover until age 98 .screening is recommended age 78.  Prostate cancer screening: some controversy, starts usually at 50: Rectal exam and PSA.  Aortic Abdominal Aneurism once between 51 and 26  years old if ever smoker.  Also recommended:  1. Dental visit- Brush and floss your teeth twice daily; visit your dentist twice a year. 2. Eye doctor- Get an eye exam at least every 2 years. 3. Helmet use- Always wear a helmet when riding a bicycle, motorcycle, rollerblading or skateboarding. 4. Safe sex- If you may be exposed to sexually transmitted infections, use a condom. 5. Seat belts- Seat belts can save your live; always wear one. 6. Smoke/Carbon Monoxide detectors- These detectors need to be installed on the appropriate level of your home. Replace batteries at least once a year. 7. Skin cancer- When out in the sun please cover up and use sunscreen 15 SPF or higher. 8. Violence- If anyone is threatening or hurting you, please tell your healthcare provider.  9. Drink alcohol in moderation- Limit alcohol intake to one drink or less per day. Never drink and drive.

## 2020-03-30 NOTE — Progress Notes (Unsigned)
HPI:  Mr. Brandon Madden is a 60 y.o.male here today for his routine physical examination. He also has some other concerns he would like to address and some labs done.  Last CPE: 11/26/17 He lives with his wife and 3 children.  Regular exercise 3 or more times per week: He has not been consistent, lately he has been doing racky ball (not CP).  Following a healthy diet: Yes.Fresh juices and low carb diet. He was participating in a wt loss program until 11/2019. Hormones were checked and he was receiving B12 injections and oral testosterone replacement. He is still on testosterone supplementation once per month, decreased frequency because fluctuations on PSA numbers. Testosterone replacement has helped with fatigue.  He would like estrogens,progesterone , and testosterone checked today.  12/18/18 total testosterone was 321, normal range.  He is concerned about prostate cancer, strong FHx.  Negative for dysuria,increased urinary frequency, gross hematuria,or decreased urine output.  Chronic medical problems: HLD and Vit D def among some.  Immunization History  Administered Date(s) Administered  . Influenza,inj,Quad PF,6+ Mos 12/15/2018  . PFIZER(Purple Top)SARS-COV-2 Vaccination 05/27/2019, 06/24/2019  . Tdap 12/18/2008   -Hep C screening: Never.  Last colon cancer screening: Colonoscopy in 02/2012, 10 years follow up recommended. Last prostate ca screening: Last PSA here in the office was normal at 0.5. Nocturia x 0-1.  Negative for high alcohol intake or tobacco use.  -Concerns and/or follow up today:   Dx'ed with COVID 19 infection 3 weeks ago. All acute symptoms have resolved but still coughing, non productive. No associated wheezing. He is taking Dayquil and Excedrin, he the latter mainly for headaches.  Headache is not uncommon. Occasionally he takes Excedrin for headache 2 times per week.  -"Accerated HR: Worse last week, he did not measure it. Started around  same time he had COVID 19. Palpitation with associated chest tightness and SOB. These symptoms happens during the day at work and at night when resting. Chest tightness and palpitations may last up to 3 hours.  + Stress, getting worse, he got a promotion;Although he does not feel anxious.  Stopped coffee intake 3 weeks ago and did not help.  Vit D deficiency: He takes 2 multivi daily with vit D.  Hyperlipidemia: He is not taking Crestor 10 mg daily.  Lab Results  Component Value Date   CHOL 219 (H) 08/14/2018   HDL 53.80 08/14/2018   LDLCALC 151 (H) 08/14/2018   TRIG 74.0 08/14/2018   CHOLHDL 4 08/14/2018   He has had abnormal LFT's, labs were done through work. He has not drunken in 12 years. He has not noted changes in color of urine/stool or jaundice.  Review of Systems  Constitutional: Positive for fatigue. Negative for activity change, appetite change and fever.  HENT: Negative for mouth sores, nosebleeds, sore throat, trouble swallowing and voice change.   Eyes: Negative for redness and visual disturbance.  Respiratory: Positive for cough, chest tightness and shortness of breath. Negative for wheezing.   Cardiovascular: Positive for palpitations. Negative for leg swelling.  Gastrointestinal: Negative for abdominal pain, blood in stool, nausea and vomiting.  Endocrine: Negative for cold intolerance, heat intolerance, polydipsia, polyphagia and polyuria.  Genitourinary: Negative for flank pain, genital sores and testicular pain.  Musculoskeletal: Negative for gait problem and joint swelling.  Skin: Negative for color change and rash.  Allergic/Immunologic: Positive for environmental allergies.  Neurological: Negative for syncope, facial asymmetry and weakness.  Hematological: Negative for adenopathy. Does not bruise/bleed easily.  Psychiatric/Behavioral:  Negative for confusion. The patient is nervous/anxious.   All other systems reviewed and are negative.  Current  Outpatient Medications on File Prior to Visit  Medication Sig Dispense Refill  . rosuvastatin (CRESTOR) 10 MG tablet Take 1 tablet (10 mg total) by mouth daily. 90 tablet 2  . UNABLE TO FIND Med Name: Testosterone PO     No current facility-administered medications on file prior to visit.   Past Medical History:  Diagnosis Date  . Deafness in right ear   . Family history of pancreatic cancer   . Family history of prostate cancer   . Family history of skin cancer   . Sleep apnea    Past Surgical History:  Procedure Laterality Date  . NASAL SEPTUM SURGERY     Allergies  Allergen Reactions  . Bee Venom    Family History  Problem Relation Age of Onset  . Heart attack Father   . Pancreatic cancer Maternal Grandmother        dx 22s, d 15s  . Skin cancer Sister   . Prostate cancer Brother        dx 40  . Prostate cancer Brother        dx 66, metastatic, d 49    Social History   Socioeconomic History  . Marital status: Married    Spouse name: Brandon Madden  . Number of children: 4  . Years of education: Law  . Highest education level: Not on file  Occupational History    Comment: [employer]  Tobacco Use  . Smoking status: Never Smoker  . Smokeless tobacco: Never Used  Substance and Sexual Activity  . Alcohol use: No    Comment: quit: 2011  . Drug use: No  . Sexual activity: Not on file  Other Topics Concern  . Not on file  Social History Narrative   Patient lives at home with his family.   Caffeine Use: 2-3 cups daily   Social Determinants of Health   Financial Resource Strain: Not on file  Food Insecurity: Not on file  Transportation Needs: Not on file  Physical Activity: Not on file  Stress: Not on file  Social Connections: Not on file   Vitals:   03/30/20 0848  BP: 118/80  Pulse: 98  Resp: 16  Temp: 97.9 F (36.6 C)  SpO2: 97%   Body mass index is 32.36 kg/m.  Wt Readings from Last 3 Encounters:  03/30/20 200 lb 8 oz (90.9 kg)  05/19/19 191 lb (86.6 kg)   08/09/18 191 lb 9.6 oz (86.9 kg)   Physical Exam Vitals and nursing note reviewed.  Constitutional:      General: He is not in acute distress.    Appearance: He is well-developed and well-groomed.  HENT:     Head: Normocephalic and atraumatic.     Right Ear: Tympanic membrane, ear canal and external ear normal.     Left Ear: Tympanic membrane, ear canal and external ear normal.     Mouth/Throat:     Mouth: Oropharynx is clear and moist and mucous membranes are normal. Mucous membranes are moist.     Pharynx: Oropharynx is clear.  Eyes:     Extraocular Movements: Extraocular movements intact and EOM normal.     Conjunctiva/sclera: Conjunctivae normal.     Pupils: Pupils are equal, round, and reactive to light.  Neck:     Thyroid: No thyromegaly.     Trachea: No tracheal deviation.  Cardiovascular:     Rate and  Rhythm: Normal rate and regular rhythm.     Pulses:          Dorsalis pedis pulses are 2+ on the right side and 2+ on the left side.     Heart sounds: No murmur heard.   Pulmonary:     Effort: Pulmonary effort is normal. No respiratory distress.     Breath sounds: Normal breath sounds.  Chest:  Breasts:     Right: No supraclavicular adenopathy.     Left: No supraclavicular adenopathy.    Abdominal:     Palpations: Abdomen is soft. There is no hepatomegaly or mass.     Tenderness: There is no abdominal tenderness.  Genitourinary:    Comments: No concerns. Musculoskeletal:        General: No tenderness or edema.     Cervical back: Normal range of motion.     Comments: No major deformities appreciated and no signs of synovitis.  Lymphadenopathy:     Cervical: No cervical adenopathy.     Upper Body:     Right upper body: No supraclavicular adenopathy.     Left upper body: No supraclavicular adenopathy.  Skin:    General: Skin is warm.     Findings: No erythema.  Neurological:     General: No focal deficit present.     Mental Status: He is alert and oriented  to person, place, and time.     Cranial Nerves: No cranial nerve deficit.     Sensory: No sensory deficit.     Coordination: Coordination normal.     Gait: Gait normal.     Deep Tendon Reflexes: Strength normal.     Reflex Scores:      Bicep reflexes are 2+ on the right side and 2+ on the left side.      Patellar reflexes are 2+ on the right side and 2+ on the left side. Psychiatric:        Mood and Affect: Mood is anxious.        Cognition and Memory: Cognition and memory normal.   ASSESSMENT AND PLAN:  Brandon Madden was seen today for annual exam and follow-up.  Diagnoses and all orders for this visit: Orders Placed This Encounter  Procedures  . DG Chest 2 View  . Tdap vaccine greater than or equal to 7yo IM  . Varicella-zoster vaccine IM  . Testosterone  . VITAMIN D 25 Hydroxy (Vit-D Deficiency, Fractures)  . TSH  . Comprehensive metabolic panel  . Lipid panel  . Hemoglobin A1c  . Hepatitis C antibody screen  . PSA(Must document that pt has been informed of limitations of PSA testing.)  . Progesterone  . Estrogens, total  . Ambulatory referral to Cardiology  . EKG 12-Lead   Lab Results  Component Value Date   PSA 0.90 03/30/2020    Lab Results  Component Value Date   CHOL 245 (H) 03/30/2020   HDL 51.50 03/30/2020   LDLCALC 170 (H) 03/30/2020   TRIG 121.0 03/30/2020   CHOLHDL 5 03/30/2020   Lab Results  Component Value Date   CREATININE 1.02 03/30/2020   BUN 19 03/30/2020   NA 137 03/30/2020   K 4.5 03/30/2020   CL 104 03/30/2020   CO2 27 03/30/2020   Lab Results  Component Value Date   ALT 52 03/30/2020   AST 37 03/30/2020   ALKPHOS 74 03/30/2020   BILITOT 0.6 03/30/2020   Lab Results  Component Value Date   TESTOSTERONE 614.53 03/30/2020  Routine general medical examination at a health care facility We discussed the importance of regular physical activity and healthy diet for prevention of chronic illness and/or complications. Preventive  guidelines reviewed. Vaccination updated.  Next CPE in a year.  The 10-year ASCVD risk score Denman George DC Montez Hageman., et al., 2013) is: 7.6%   Values used to calculate the score:     Age: 65 years     Sex: Male     Is Non-Hispanic African American: No     Diabetic: No     Tobacco smoker: No     Systolic Blood Pressure: 118 mmHg     Is BP treated: No     HDL Cholesterol: 51.5 mg/dL     Total Cholesterol: 245 mg/dL  Hyperlipidemia, unspecified hyperlipidemia type He has not been consistent with taking Crestor daily. We had discussed CV benefits of statin medications. Further recommendation will be given according to lipid panel result.  Vitamin D deficiency, unspecified Further recommendation will be given according to 25 OH vitamin D result.  Patient request for diagnostic testing -     Estrogens, total -     Progesterone -     Testosterone  Heart palpitations With associated chest tightness. EKG today with artefact on some leads, leads did not stay in place on chest. SR, normal axis, ?IVCD. No other EKG for comparison. He is very concerned about possible heart disease and would like to see cardiologist.  Encounter for HCV screening test for low risk patient -     Hepatitis C antibody screen  Prostate cancer screening -     PSA(Must document that pt has been informed of limitations of PSA testing.)  Screening for endocrine, metabolic and immunity disorder -     Hemoglobin A1c -     Comprehensive metabolic panel  Cough, persistent Lung auscultation today negative. Explained that it is not uncommon to have cough for a few days and even weeks after acute respiratory symptoms have resolved. Further recommendation will be given according to check x-ray results.  Need for Tdap vaccination -     Tdap vaccine greater than or equal to 7yo IM  Need for shingles vaccine -     Varicella-zoster vaccine IM   Return in about 1 year (around 03/30/2021) for cpe, before if  needed..   Tya Haughey G. [place], MD  [facility]. [place] [facility].  A few things to remember from today's visit:   Routine general medical examination at a health care facility  Hyperlipidemia, unspecified hyperlipidemia type - Plan: Lipid panel  Vitamin D deficiency, unspecified - Plan: VITAMIN D 25 Hydroxy (Vit-D Deficiency, Fractures)  Patient request for diagnostic testing - Plan: Testosterone, Estrogens, total, Progesterone  Heart palpitations - Plan: TSH, EKG 12-Lead  Encounter for HCV screening test for low risk patient - Plan: Hepatitis C antibody screen  Prostate cancer screening - Plan: PSA(Must document that pt has been informed of limitations of PSA testing.)  Screening for endocrine, metabolic and immunity disorder - Plan: Comprehensive metabolic panel, Hemoglobin A1c  Cough, persistent - Plan: DG Chest 2 View  Please be sure medication list is accurate. If a new problem present, please set up appointment sooner than planned today.  At least 150 minutes of moderate exercise per week, daily brisk walking for 15-30 min is a good exercise option. Healthy diet low in saturated (animal) fats and sweets and consisting of fresh fruits and vegetables, lean meats such as fish and white chicken and whole grains.  -  Vaccines:  Tdap vaccine every 10 years.  Shingles vaccine recommended at age 38, could be given after 59 years of age but not sure about insurance coverage.  Pneumonia vaccines: Pneumovax at 62  -Screening recommendations for low/normal risk males:  Screening for diabetes at age 38 and every 3 years. Earlier screening if cardiovascular risk factors.   Lipid screening at 35 and every 3 years. Screening starts in younger males with cardiovascular risk factors. N/A  Colon cancer screening is now at age 19 but your insurance may not cover until age 81 .screening is recommended age 62.  Prostate cancer screening: some controversy, starts usually at  50: Rectal exam and PSA.  Aortic Abdominal Aneurism once between 46 and 90 years old if ever smoker.  Also recommended:  1. Dental visit- Brush and floss your teeth twice daily; visit your dentist twice a year. 2. Eye doctor- Get an eye exam at least every 2 years. 3. Helmet use- Always wear a helmet when riding a bicycle, motorcycle, rollerblading or skateboarding. 4. Safe sex- If you may be exposed to sexually transmitted infections, use a condom. 5. Seat belts- Seat belts can save your live; always wear one. 6. Smoke/Carbon Monoxide detectors- These detectors need to be installed on the appropriate level of your home. Replace batteries at least once a year. 7. Skin cancer- When out in the sun please cover up and use sunscreen 15 SPF or higher. 8. Violence- If anyone is threatening or hurting you, please tell your healthcare provider.  9. Drink alcohol in moderation- Limit alcohol intake to one drink or less per day. Never drink and drive.

## 2020-04-01 MED ORDER — ROSUVASTATIN CALCIUM 10 MG PO TABS: 10.0000 mg | ORAL_TABLET | Freq: Every day | ORAL | 2 refills | Status: DC

## 2020-04-03 LAB — HEPATITIS C ANTIBODY
Hepatitis C Ab: NONREACTIVE
SIGNAL TO CUT-OFF: 0.01 (ref <1.00)

## 2020-04-03 LAB — ESTROGENS, TOTAL: Estrogen: 242.6 pg/mL — ABNORMAL HIGH (ref 60–190)

## 2020-04-03 LAB — PROGESTERONE: Progesterone: <0.5 ng/mL (ref <1.4)

## 2020-04-04 NOTE — Progress Notes (Signed)
Cardiology Office Note:    Date:  04/05/2020   ID:  Brandon Madden, DOB May 10, 1960, MRN [id number]  PCP:  Madden, Brandon G, MD   [facility]  Cardiologist:  No primary care provider on file.  Advanced Practice Provider:  No care team member to display Electrophysiologist:  None    Referring MD: Madden, Brandon G, MD    History of Present Illness:    Brandon Madden is a 60 y.o. male with a hx of HLD and recent COVID infection who was referred by Dr. [place] for further evaluation of palpitations and chest pressure.  Patient seen by PCP on 03/30/20. Note reviewed. Has been having episodes of heart racing since recent COVID infection. Has some associated SOB and chest tightness. He is now referred to cardiology for further management.  Patient states that 20 years ago he had similar chest discomfort to what he is having now. Stress testing was reassuringly normal. Since that time, he has had only occasional symptoms until about 5 months ago when he started to have more frequent palpitations. Palpitations last anywhere from 20-30 minutes but can be up to 1-2 hours. Can wake him up from sleep. Also having intermittent chest tightness that usually occurs in the morning or afternoon when working. Not exertional related and does not correlate with palpitations. He is able to play racquetball for an hour without symptoms.    No LE edema, orthopnea, PND, lightheadedness or syncope. Has had vagal like symptoms in the past. No bleeding issues. Has some persistent SOB since covid but again, he is exercising without issues.  Father: passed away at 65 with MI.  Past Medical History:  Diagnosis Date  . Deafness in right ear   . Family history of pancreatic cancer   . Family history of prostate cancer   . Family history of skin cancer   . Sleep apnea     Past Surgical History:  Procedure Laterality Date  . NASAL SEPTUM SURGERY      Current Medications: Current Meds   Medication Sig  . Misc Natural Products (NF FORMULAS TESTOSTERONE PO) Take by mouth every 14 (fourteen) days.  . rosuvastatin (CRESTOR) 10 MG tablet Take 1 tablet (10 mg total) by mouth daily.     Allergies:   Bee venom   Social History   Socioeconomic History  . Marital status: Married    Spouse name: Brandon Madden  . Number of children: 4  . Years of education: Law  . Highest education level: Not on file  Occupational History    Comment: [employer]  Tobacco Use  . Smoking status: Never Smoker  . Smokeless tobacco: Never Used  Substance and Sexual Activity  . Alcohol use: No    Comment: quit: 2011  . Drug use: No  . Sexual activity: Not on file  Other Topics Concern  . Not on file  Social History Narrative   Patient lives at home with his family.   Caffeine Use: 2-3 cups daily   Social Determinants of Health   Financial Resource Strain: Not on file  Food Insecurity: Not on file  Transportation Needs: Not on file  Physical Activity: Not on file  Stress: Not on file  Social Connections: Not on file     Family History: The patient's family history includes Heart attack in his father; Pancreatic cancer in his maternal grandmother; Prostate cancer in his brother and brother; Skin cancer in his sister.  ROS:   Please see the history of  present illness.    Review of Systems  Constitutional: Positive for malaise/fatigue. Negative for chills and fever.  HENT: Negative for congestion.   Eyes: Negative for blurred vision, double vision and redness.  Respiratory: Positive for shortness of breath.   Cardiovascular: Positive for chest pain and palpitations. Negative for orthopnea, claudication, leg swelling and PND.  Gastrointestinal: Negative for melena, nausea and vomiting.  Genitourinary: Negative for hematuria.  Musculoskeletal: Negative for falls.  Neurological: Negative for dizziness and loss of consciousness.  Endo/Heme/Allergies: Negative for polydipsia.   Psychiatric/Behavioral: Negative for substance abuse.    EKGs/Labs/Other Studies Reviewed:    The following studies were reviewed today: No cardiac monitor in system  EKG:  EKG 03/30/20: NSR  Recent Labs: 03/30/2020: ALT 52; BUN 19; Creatinine, Ser 1.02; Potassium 4.5; Sodium 137; TSH 1.64  Recent Lipid Panel    Component Value Date/Time   CHOL 245 (H) 03/30/2020 1011   TRIG 121.0 03/30/2020 1011   HDL 51.50 03/30/2020 1011   CHOLHDL 5 03/30/2020 1011   VLDL 24.2 03/30/2020 1011   LDLCALC 170 (H) 03/30/2020 1011      Physical Exam:    VS:  BP 116/72   Pulse 86   Ht 5\' 6"  (1.676 m)   Wt 199 lb (90.3 kg)   SpO2 96%   BMI 32.12 kg/m     Wt Readings from Last 3 Encounters:  04/05/20 199 lb (90.3 kg)  03/30/20 200 lb 8 oz (90.9 kg)  05/19/19 191 lb (86.6 kg)     GEN:  Well nourished, well developed in no acute distress HEENT: Normal NECK: No JVD; No carotid bruits CARDIAC: RRR, no murmurs, rubs, gallops RESPIRATORY:  Clear to auscultation without rales, wheezing or rhonchi  ABDOMEN: Soft, non-tender, non-distended MUSCULOSKELETAL:  No edema; No deformity  SKIN: Warm and dry NEUROLOGIC:  Alert and oriented x 3 PSYCHIATRIC:  Normal affect   ASSESSMENT:    1. Palpitations   2. Chest pain of uncertain etiology   3. Hyperlipidemia, unspecified hyperlipidemia type    PLAN:    In order of problems listed above:  #Palpitations: Patient with worsening palpitations over the past 5 months. Symptoms that used to occur occasionally are now occurring almost daily. Last anywhere from 73min-2 hours at a time. No associated dizziness, chest pain (at that time), or SOB. No known history of CAD. Has cut back on caffeine without much improvement. -Check 2 week zio -Decreased caffeine intake -Maintain good hydration -Treatment/further work-up pending zio findings  #Chest Tightness: Does not sound cardiac related as symptoms occur at rest and never with exertion. Able to  exercise 1 hour without issues. Likely related to stress. Given family history of CAD and HLD, will check coronary calcium score for risk stratification. -Calcium score as below  #HLD #Risk stratification: -Check coronary calcium score -Continue crestor 10mg  daily   Medication Adjustments/Labs and Tests Ordered: Current medicines are reviewed at length with the patient today.  Concerns regarding medicines are outlined above.  Orders Placed This Encounter  Procedures  . CT CARDIAC SCORING (SELF PAY ONLY)  . LONG TERM MONITOR (3-14 DAYS)   No orders of the defined types were placed in this encounter.   Patient Instructions  Medication Instructions:  Your physician recommends that you continue on your current medications as directed. Please refer to the Current Medication list given to you today.  *If you need a refill on your cardiac medications before your next appointment, please call your pharmacy*   Lab Work:  None If you have labs (blood work) drawn today and your tests are completely normal, you will receive your results only by: Marland Kitchen MyChart Message (if you have MyChart) OR . A paper copy in the mail If you have any lab test that is abnormal or we need to change your treatment, we will call you to review the results.   Testing/Procedures: Your physician recommends that you have a Calcium Score performed.  Your physician recommends that you wear a monitor for 14 days.   Follow-Up: At [facility], you and your health needs are our priority.  As part of our continuing mission to provide you with exceptional heart care, we have created designated Provider Care Teams.  These Care Teams include your primary Cardiologist (physician) and Advanced Practice Providers (APPs -  Physician Assistants and Nurse Practitioners) who all work together to provide you with the care you need, when you need it.  We recommend signing up for the patient portal called "MyChart".  Sign up  information is provided on this After Visit Summary.  MyChart is used to connect with patients for Virtual Visits (Telemedicine).  Patients are able to view lab/test results, encounter notes, upcoming appointments, etc.  Non-urgent messages can be sent to your provider as well.   To learn more about what you can do with MyChart, go to [email or web address].    Your next appointment:   6 week(s)  The format for your next appointment:   In Person  Provider:   You may see Dr. Laurance Flatten or one of the following Advanced Practice Providers on your designated Care Team:    Tereso Newcomer, PA-C  Vin Iver Nestle, [place]    Other Instructions  Christena Deem- Long Term Monitor Instructions   Your physician has requested you wear your ZIO patch monitor 14 days.   This is a single patch monitor.  [employer] supplies one patch monitor per enrollment.  Additional stickers are not available.   Please do not apply patch if you will be having a Nuclear Stress Test, Echocardiogram, Cardiac CT, MRI, or Chest Xray during the time frame you would be wearing the monitor. The patch cannot be worn during these tests.  You cannot remove and re-apply the ZIO XT patch monitor.   Your ZIO patch monitor will be sent USPS Priority mail from [facility] directly to your home address. The monitor may also be mailed to a PO BOX if home delivery is not available.   It may take 3-5 days to receive your monitor after you have been enrolled.   Once you have received you monitor, please review enclosed instructions.  Your monitor has already been registered assigning a specific monitor serial # to you.   Applying the monitor   Shave hair from upper left chest.   Hold abrader disc by orange tab.  Rub abrader in 40 strokes over left upper chest as indicated in your monitor instructions.   Clean area with 4 enclosed alcohol pads .  Use all pads to assure are is cleaned thoroughly.  Let dry.   Apply patch as indicated  in monitor instructions.  Patch will be place under collarbone on left side of chest with arrow pointing upward.   Rub patch adhesive wings for 2 minutes.Remove white label marked "1".  Remove white label marked "2".  Rub patch adhesive wings for 2 additional minutes.   While looking in a mirror, press and release button in center of patch.  A small green light will  flash 3-4 times .  This will be your only indicator the monitor has been turned on.     Do not shower for the first 24 hours.  You may shower after the first 24 hours.   Press button if you feel a symptom. You will hear a small click.  Record Date, Time and Symptom in the Patient Log Book.   When you are ready to remove patch, follow instructions on last 2 pages of Patient Log Book.  Stick patch monitor onto last page of Patient Log Book.   Place Patient Log Book in [place] box.  Use locking tab on box and tape box closed securely.  The Orange [employer] box has [employer] postage on it.  Please place in mailbox as soon as possible.  Your physician should have your test results approximately 7 days after the monitor has [facility].   Call [facility] Customer Care at [phone] if you have questions regarding your ZIO XT patch monitor.  Call them immediately if you see an orange light blinking on your monitor.   If your monitor falls off in less than 4 days contact our Monitor department at [phone].  If your monitor becomes loose or falls off after 4 days call [employer] at [phone] for suggestions on securing your monitor.       Signed, Meriam SpragueHeather E Keaden Gunnoe, MD  04/05/2020 4:58 PM    [facility]

## 2020-04-05 ENCOUNTER — Ambulatory Visit: Payer: BC Managed Care – PPO | Admitting: Cardiology

## 2020-04-05 ENCOUNTER — Encounter: Payer: Self-pay | Admitting: Cardiology

## 2020-04-05 ENCOUNTER — Ambulatory Visit (INDEPENDENT_AMBULATORY_CARE_PROVIDER_SITE_OTHER): Payer: BC Managed Care – PPO

## 2020-04-05 ENCOUNTER — Encounter: Payer: Self-pay | Admitting: *Deleted

## 2020-04-05 ENCOUNTER — Other Ambulatory Visit: Payer: Self-pay

## 2020-04-05 VITALS — BP 116/72 | HR 86 | Ht 66.0 in | Wt 199.0 lb

## 2020-04-05 DIAGNOSIS — R079 Chest pain, unspecified: Secondary | ICD-10-CM

## 2020-04-05 DIAGNOSIS — R002 Palpitations: Secondary | ICD-10-CM

## 2020-04-05 DIAGNOSIS — E785 Hyperlipidemia, unspecified: Secondary | ICD-10-CM | POA: Diagnosis not present

## 2020-04-05 NOTE — Patient Instructions (Signed)
Medication Instructions:  Your physician recommends that you continue on your current medications as directed. Please refer to the Current Medication list given to you today.  *If you need a refill on your cardiac medications before your next appointment, please call your pharmacy*   Lab Work: None If you have labs (blood work) drawn today and your tests are completely normal, you will receive your results only by: Marland Kitchen MyChart Message (if you have MyChart) OR . A paper copy in the mail If you have any lab test that is abnormal or we need to change your treatment, we will call you to review the results.   Testing/Procedures: Your physician recommends that you have a Calcium Score performed.  Your physician recommends that you wear a monitor for 14 days.   Follow-Up: At [facility], you and your health needs are our priority.  As part of our continuing mission to provide you with exceptional heart care, we have created designated Provider Care Teams.  These Care Teams include your primary Cardiologist (physician) and Advanced Practice Providers (APPs -  Physician Assistants and Nurse Practitioners) who all work together to provide you with the care you need, when you need it.  We recommend signing up for the patient portal called "MyChart".  Sign up information is provided on this After Visit Summary.  MyChart is used to connect with patients for Virtual Visits (Telemedicine).  Patients are able to view lab/test results, encounter notes, upcoming appointments, etc.  Non-urgent messages can be sent to your provider as well.   To learn more about what you can do with MyChart, go to [email or web address].    Your next appointment:   6 week(s)  The format for your next appointment:   In Person  Provider:   You may see Dr. Laurance Flatten or one of the following Advanced Practice Providers on your designated Care Team:    Tereso Newcomer, PA-C  Vin Iver Nestle, [place]    Other  Instructions  Christena Deem- Long Term Monitor Instructions   Your physician has requested you wear your ZIO patch monitor 14 days.   This is a single patch monitor.  [employer] supplies one patch monitor per enrollment.  Additional stickers are not available.   Please do not apply patch if you will be having a Nuclear Stress Test, Echocardiogram, Cardiac CT, MRI, or Chest Xray during the time frame you would be wearing the monitor. The patch cannot be worn during these tests.  You cannot remove and re-apply the ZIO XT patch monitor.   Your ZIO patch monitor will be sent USPS Priority mail from [facility] directly to your home address. The monitor may also be mailed to a PO BOX if home delivery is not available.   It may take 3-5 days to receive your monitor after you have been enrolled.   Once you have received you monitor, please review enclosed instructions.  Your monitor has already been registered assigning a specific monitor serial # to you.   Applying the monitor   Shave hair from upper left chest.   Hold abrader disc by orange tab.  Rub abrader in 40 strokes over left upper chest as indicated in your monitor instructions.   Clean area with 4 enclosed alcohol pads .  Use all pads to assure are is cleaned thoroughly.  Let dry.   Apply patch as indicated in monitor instructions.  Patch will be place under collarbone on left side of chest with arrow pointing upward.  Rub patch adhesive wings for 2 minutes.Remove white label marked "1".  Remove white label marked "2".  Rub patch adhesive wings for 2 additional minutes.   While looking in a mirror, press and release button in center of patch.  A small green light will flash 3-4 times .  This will be your only indicator the monitor has been turned on.     Do not shower for the first 24 hours.  You may shower after the first 24 hours.   Press button if you feel a symptom. You will hear a small click.  Record Date, Time and Symptom in  the Patient Log Book.   When you are ready to remove patch, follow instructions on last 2 pages of Patient Log Book.  Stick patch monitor onto last page of Patient Log Book.   Place Patient Log Book in [place] box.  Use locking tab on box and tape box closed securely.  The Orange and [employer] has [employer] on it.  Please place in mailbox as soon as possible.  Your physician should have your test results approximately 7 days after the monitor has been mailed back to [facility].   Call [facility] Customer Care at [phone] if you have questions regarding your ZIO XT patch monitor.  Call them immediately if you see an orange light blinking on your monitor.   If your monitor falls off in less than 4 days contact our Monitor department at [phone].  If your monitor becomes loose or falls off after 4 days call [employer] at [phone] for suggestions on securing your monitor.

## 2020-04-05 NOTE — Progress Notes (Signed)
Patient ID: Brandon Madden, male   DOB: [birth year], 60 y.o.   MRN: [id number] Patient enrolled for [employer] to ship a 14 day ZIO XT long term holter monitor to his home.

## 2020-04-26 DIAGNOSIS — R002 Palpitations: Secondary | ICD-10-CM | POA: Diagnosis not present

## 2020-04-30 ENCOUNTER — Ambulatory Visit
Admission: RE | Admit: 2020-04-30 | Discharge: 2020-04-30 | Disposition: A | Discharge status: Home / Self Care | Payer: Self-pay | Source: Ambulatory Visit | Attending: Cardiology | Admitting: Cardiology

## 2020-04-30 ENCOUNTER — Other Ambulatory Visit: Payer: Self-pay

## 2020-04-30 DIAGNOSIS — R079 Chest pain, unspecified: Secondary | ICD-10-CM

## 2020-05-03 ENCOUNTER — Telehealth: Payer: Self-pay

## 2020-05-03 MED ORDER — ROSUVASTATIN CALCIUM 20 MG PO TABS: 20.0000 mg | ORAL_TABLET | Freq: Every day | ORAL | 3 refills | Status: DC

## 2020-05-03 MED ORDER — ASPIRIN EC 81 MG PO TBEC: 81.0000 mg | DELAYED_RELEASE_TABLET | Freq: Every day | ORAL | 3 refills | Status: DC

## 2020-05-03 NOTE — Telephone Encounter (Signed)
-----   Message from [name], MD sent at 05/03/2020  1:50 PM EDT ----- His calcium score is 166 which is in the 80% for his age/gender matched controls. This means he has some plaque in his heart arteries. This is not likely to be the cause of his chest discomfort as he is not having pain with exertion, but we need to prevent the plaque from getting worse. Can we increase his crestor to 20mg  daily and start ASA 81mg  daily.

## 2020-05-03 NOTE — Telephone Encounter (Signed)
Increased rosuvastatin (Crestor) to 20mg  PO QD and added Aspirin 81 mg PO QD.

## 2020-05-04 NOTE — Telephone Encounter (Signed)
Patient call back to get his results

## 2020-05-04 NOTE — Telephone Encounter (Signed)
Called patient reviewed results answered questions regarding additional ways to help with plague build up.  Suggested diet changes, exercise, weight loss, as well as medication.  He is looking forward to upcoming appointment with Dr. Shari Prows on 05/17/20.

## 2020-05-14 NOTE — Progress Notes (Signed)
Cardiology Office Note:    Date:  05/17/2020   ID:  Brandon Madden, DOB 09/14/1960, MRN [id number]  PCP:  [place], Betty G, MD   [facility]  Cardiologist:  No primary care provider on file.  Advanced Practice Provider:  No care team member to display Electrophysiologist:  None   Referring MD: [place], Betty G, MD    History of Present Illness:    Brandon Madden is a 60 y.o. male with a hx of congenital deafness, HLD and recent COVID infection who presents to clinic for follow-up.   Patient last seen on 04/05/18 for non-exertional chest pain and palpitations. 2 week zio monitor without significant arrhythmias. Coronary calcium score 166 which was 80% for age and sex matched control. He now returns to clinic for follow-up.  The patient states that he overall feels well. Palpitations improved. No chest pain, SOB, DOE. No lightheadedness or dizziness. Tolerating crestor  daily. He is concerned about his risk of future CV events given his calcium score and family history.  Past Medical History:  Diagnosis Date  . Deafness in right ear   . Family history of pancreatic cancer   . Family history of prostate cancer   . Family history of skin cancer   . Sleep apnea     Past Surgical History:  Procedure Laterality Date  . NASAL SEPTUM SURGERY      Current Medications: Current Meds  Medication Sig  . aspirin EC 81 MG tablet Take 1 tablet (81 mg total) by mouth daily. Swallow whole.  . Misc Natural Products (NF FORMULAS TESTOSTERONE PO) Take by mouth every 14 (fourteen) days.  . rosuvastatin (CRESTOR) 20 MG tablet Take 1 tablet (20 mg total) by mouth daily.     Allergies:   Bee venom   Social History   Socioeconomic History  . Marital status: Married    Spouse name: Brandon Madden  . Number of children: 4  . Years of education: Law  . Highest education level: Not on file  Occupational History    Comment: [employer]  Tobacco Use  . Smoking status: Never Smoker  .  Smokeless tobacco: Never Used  Substance and Sexual Activity  . Alcohol use: No    Comment: quit: 2011  . Drug use: No  . Sexual activity: Not on file  Other Topics Concern  . Not on file  Social History Narrative   Patient lives at home with his family.   Caffeine Use: 2-3 cups daily   Social Determinants of Health   Financial Resource Strain: Not on file  Food Insecurity: Not on file  Transportation Needs: Not on file  Physical Activity: Not on file  Stress: Not on file  Social Connections: Not on file     Family History: The patient's family history includes Heart attack in his father; Pancreatic cancer in his maternal grandmother; Prostate cancer in his brother and brother; Skin cancer in his sister.  ROS:   Please see the history of present illness.    Review of Systems  Constitutional: Negative for chills and fever.  HENT: Negative for hearing loss and sore throat.   Eyes: Negative for blurred vision and redness.  Respiratory: Negative for shortness of breath.   Cardiovascular: Negative for chest pain, palpitations, orthopnea, claudication, leg swelling and PND.  Gastrointestinal: Negative for nausea and vomiting.  Genitourinary: Negative for dysuria and flank pain.  Musculoskeletal: Negative for falls.  Neurological: Negative for dizziness and loss of consciousness.  Endo/Heme/Allergies: Negative  for polydipsia.  Psychiatric/Behavioral: Negative for substance abuse.    EKGs/Labs/Other Studies Reviewed:    The following studies were reviewed today: Cardiac monitor 04/27/20:  Patch wear time was 13 days and 9 hours  Predominant rhythm was NSR with average HR 84 (ranged from 48-172bpm)  Rare SVE, rare PVCs <1%  No Afib, significant pauses or sustained arrhythmias  Triggered events mainly correlated with PACs.  Overall, normal cardiac monitor  Cardiac CT scan 04/30/20: FINDINGS: Non-cardiac: See separate report from [facility].  Ascending  Aorta: Normal caliber. No calcifications.  Pericardium: Normal  Coronary arteries: Normal coronary origins.  IMPRESSION: Coronary calcium score of 166. This was 80th percentile for age and sex matched control.   Recent Labs: 03/30/2020: ALT 52; BUN 19; Creatinine, Ser 1.02; Potassium 4.5; Sodium 137; TSH 1.64  Recent Lipid Panel    Component Value Date/Time   CHOL 245 (H) 03/30/2020 1011   TRIG 121.0 03/30/2020 1011   HDL 51.50 03/30/2020 1011   CHOLHDL 5 03/30/2020 1011   VLDL 24.2 03/30/2020 1011   LDLCALC 170 (H) 03/30/2020 1011     Physical Exam:    VS:  BP 110/60   Pulse 71   Ht 5\' 6"  (1.676 m)   Wt 199 lb 9.6 oz (90.5 kg)   SpO2 95%   BMI 32.22 kg/m     Wt Readings from Last 3 Encounters:  05/17/20 199 lb 9.6 oz (90.5 kg)  04/05/20 199 lb (90.3 kg)  03/30/20 200 lb 8 oz (90.9 kg)     GEN:  Well nourished, well developed in no acute distress HEENT: Normal NECK: No JVD; No carotid bruits CARDIAC: RRR, no murmurs, rubs, gallops RESPIRATORY:  Clear to auscultation without rales, wheezing or rhonchi  ABDOMEN: Soft, non-tender, non-distended MUSCULOSKELETAL:  No edema; No deformity  SKIN: Warm and dry NEUROLOGIC:  Alert and oriented x 3 PSYCHIATRIC:  Normal affect   ASSESSMENT:    1. Palpitations   2. Hyperlipidemia, unspecified hyperlipidemia type   3. Medication management   4. Chest pain of uncertain etiology   5. Coronary artery disease involving native coronary artery of native heart without angina pectoris    PLAN:    In order of problems listed above:  #Palpitations: Improved significantly. Cardiac monitor with rare ectopy and no significant arrhythmias.  -Cardiac monitor without significant arrhythmias -Decreased caffeine intake -Maintain good hydration -Continue to monitor  #Chest Tightness: Improved. Does not sound cardiac related as symptoms occur at rest and never with exertion. Able to exercise 1 hour without issues. Likely  related to stress. Calcium score 166. -Continue to monitor -Management of coronary calcium as below  #HLD #Coronary calcification: Ca score 166 which is 80% for age-gender matched control -Continue crestor 20mg  daily -Repeat lipids in 2 weeks -Goal LDL<70 -Lifestyle modifications as below  Exercise recommendations: Goal of exercising for at least 30 minutes a day, at least 5 times per week.  Please exercise to a moderate exertion.  This means that while exercising it is difficult to speak in full sentences, however you are not so short of breath that you feel you must stop, and not so comfortable that you can carry on a full conversation.  Exertion level should be approximately a 5/10, if 10 is the most exertion you can perform.  Diet recommendations: Recommend a heart healthy diet such as the Mediterranean diet.  This diet consists of plant based foods, healthy fats, lean meats, olive oil.  It suggests limiting the intake of simple  carbohydrates such as white breads, pastries, and pastas.  It also limits the amount of red meat, wine, and dairy products such as cheese that one should consume on a daily basis.    Medication Adjustments/Labs and Tests Ordered: Current medicines are reviewed at length with the patient today.  Concerns regarding medicines are outlined above.  Orders Placed This Encounter  Procedures  . Lipid Profile   No orders of the defined types were placed in this encounter.   Patient Instructions   Medication Instructions:   Your physician recommends that you continue on your current medications as directed. Please refer to the Current Medication list given to you today.  *If you need a refill on your cardiac medications before your next appointment, please call your pharmacy*   Lab Work:  IN 2 WEEKS, HERE AT THE OFFICE--WE WILL CHECK LIPIDS AT THAT TIME--PLEASE COME FASTING TO THIS LAB APPOINTMENT  If you have labs (blood work) drawn today and your tests  are completely normal, you will receive your results only by: Marland Kitchen MyChart Message (if you have MyChart) OR . A paper copy in the mail If you have any lab test that is abnormal or we need to change your treatment, we will call you to review the results.    Follow-Up: At [facility], you and your health needs are our priority.  As part of our continuing mission to provide you with exceptional heart care, we have created designated Provider Care Teams.  These Care Teams include your primary Cardiologist (physician) and Advanced Practice Providers (APPs -  Physician Assistants and Nurse Practitioners) who all work together to provide you with the care you need, when you need it.  We recommend signing up for the patient portal called "MyChart".  Sign up information is provided on this After Visit Summary.  MyChart is used to connect with patients for Virtual Visits (Telemedicine).  Patients are able to view lab/test results, encounter notes, upcoming appointments, etc.  Non-urgent messages can be sent to your provider as well.   To learn more about what you can do with MyChart, go to [email or web address].    Your next appointment:   6 month(s)  The format for your next appointment:   In Person  Provider:   You will see one of the following Advanced Practice Providers on your designated Care Team:    Tereso Newcomer, PA-C  [place], [place]       Other Instructions    Mediterranean Diet A Mediterranean diet refers to food and lifestyle choices that are based on the traditions of countries located on the [place]. This way of eating has been shown to help prevent certain conditions and improve outcomes for people who have chronic diseases, like kidney disease and heart disease. What are tips for following this plan? Lifestyle  Cook and eat meals together with your family, when possible.  Drink enough fluid to keep your urine clear or pale yellow.  Be physically active  every day. This includes: ? Aerobic exercise like running or swimming. ? Leisure activities like gardening, walking, or housework.  Get 7-8 hours of sleep each night.  If recommended by your health care provider, drink red wine in moderation. This means 1 glass a day for nonpregnant women and 2 glasses a day for men. A glass of wine equals 5 oz (150 mL). Reading food labels  Check the serving size of packaged foods. For foods such as rice and pasta, the serving size refers to  the amount of cooked product, not dry.  Check the total fat in packaged foods. Avoid foods that have saturated fat or trans fats.  Check the ingredients list for added sugars, such as corn syrup.   Shopping  At the grocery store, buy most of your food from the areas near the walls of the store. This includes: ? Fresh fruits and vegetables (produce). ? Grains, beans, nuts, and seeds. Some of these may be available in unpackaged forms or large amounts (in bulk). ? Fresh seafood. ? Poultry and eggs. ? Low-fat dairy products.  Buy whole ingredients instead of prepackaged foods.  Buy fresh fruits and vegetables in-season from local farmers markets.  Buy frozen fruits and vegetables in resealable bags.  If you do not have access to quality fresh seafood, buy precooked frozen shrimp or canned fish, such as tuna, salmon, or sardines.  Buy small amounts of raw or cooked vegetables, salads, or olives from the deli or salad bar at your store.  Stock your pantry so you always have certain foods on hand, such as olive oil, canned tuna, canned tomatoes, rice, pasta, and beans. Cooking  Cook foods with extra-virgin olive oil instead of using butter or other vegetable oils.  Have meat as a side dish, and have vegetables or grains as your main dish. This means having meat in small portions or adding small amounts of meat to foods like pasta or stew.  Use beans or vegetables instead of meat in common dishes like chili or  lasagna.  Experiment with different cooking methods. Try roasting or broiling vegetables instead of steaming or sauteing them.  Add frozen vegetables to soups, stews, pasta, or rice.  Add nuts or seeds for added healthy fat at each meal. You can add these to yogurt, salads, or vegetable dishes.  Marinate fish or vegetables using olive oil, lemon juice, garlic, and fresh herbs. Meal planning  Plan to eat 1 vegetarian meal one day each week. Try to work up to 2 vegetarian meals, if possible.  Eat seafood 2 or more times a week.  Have healthy snacks readily available, such as: ? Vegetable sticks with hummus. ? [place] yogurt. ? Fruit and nut trail mix.  Eat balanced meals throughout the week. This includes: ? Fruit: 2-3 servings a day ? Vegetables: 4-5 servings a day ? Low-fat dairy: 2 servings a day ? Fish, poultry, or lean meat: 1 serving a day ? Beans and legumes: 2 or more servings a week ? Nuts and seeds: 1-2 servings a day ? Whole grains: 6-8 servings a day ? Extra-virgin olive oil: 3-4 servings a day  Limit red meat and sweets to only a few servings a month   What are my food choices?  Mediterranean diet ? Recommended  Grains: Whole-grain pasta. Brown rice. Bulgar wheat. Polenta. Couscous. Whole-wheat bread. Orpah Cobb.  Vegetables: Artichokes. Beets. Broccoli. Cabbage. Carrots. Eggplant. Green beans. Chard. Kale. Spinach. Onions. Leeks. Peas. Squash. Tomatoes. Peppers. Radishes.  Fruits: Apples. Apricots. Avocado. Berries. Bananas. Cherries. Dates. Figs. Grapes. Lemons. Melon. Oranges. Peaches. Plums. Pomegranate.  Meats and other protein foods: Beans. Almonds. Sunflower seeds. Pine nuts. Peanuts. Cod. Salmon. Scallops. Shrimp. Tuna. Tilapia. Clams. Oysters. Eggs.  Dairy: Low-fat milk. Cheese. Greek yogurt.  Beverages: Water. Red wine. Herbal tea.  Fats and oils: Extra virgin olive oil. Avocado oil. Grape seed oil.  Sweets and desserts: [place] yogurt with  honey. Baked apples. Poached pears. Trail mix.  Seasoning and other foods: Basil. Cilantro. Coriander. Cumin. Mint. Parsley.  Verlee Monte. Tarragon. Garlic. Oregano. Thyme. Pepper. Balsalmic vinegar. Tahini. Hummus. Tomato sauce. Olives. Mushrooms. ? Limit these  Grains: Prepackaged pasta or rice dishes. Prepackaged cereal with added sugar.  Vegetables: Deep fried potatoes (french fries).  Fruits: Fruit canned in syrup.  Meats and other protein foods: Beef. Pork. Lamb. Poultry with skin. Hot dogs. Tomasa Blase.  Dairy: Ice cream. Sour cream. Whole milk.  Beverages: Juice. Sugar-sweetened soft drinks. Beer. Liquor and spirits.  Fats and oils: Butter. Canola oil. Vegetable oil. Beef fat (tallow). Lard.  Sweets and desserts: Cookies. Cakes. Pies. Candy.  Seasoning and other foods: Mayonnaise. Premade sauces and marinades. The items listed may not be a complete list. Talk with your dietitian about what dietary choices are right for you. Summary  The Mediterranean diet includes both food and lifestyle choices.  Eat a variety of fresh fruits and vegetables, beans, nuts, seeds, and whole grains.  Limit the amount of red meat and sweets that you eat.  Talk with your health care provider about whether it is safe for you to drink red wine in moderation. This means 1 glass a day for nonpregnant women and 2 glasses a day for men. A glass of wine equals 5 oz (150 mL). This information is not intended to replace advice given to you by your health care provider. Make sure you discuss any questions you have with your health care provider. Document Revised: 10/07/2015 Document Reviewed: 09/30/2015 Elsevier Patient Education  2020 [employer].  Mediterranean Diet A Mediterranean diet refers to food and lifestyle choices that are based on the traditions of countries located on the [place]. This way of eating has been shown to help prevent certain conditions and improve outcomes for people  who have chronic diseases, like kidney disease and heart disease. What are tips for following this plan? Lifestyle  Cook and eat meals together with your family, when possible.  Drink enough fluid to keep your urine clear or pale yellow.  Be physically active every day. This includes: ? Aerobic exercise like running or swimming. ? Leisure activities like gardening, walking, or housework.  Get 7-8 hours of sleep each night.  If recommended by your health care provider, drink red wine in moderation. This means 1 glass a day for nonpregnant women and 2 glasses a day for men. A glass of wine equals 5 oz (150 mL). Reading food labels  Check the serving size of packaged foods. For foods such as rice and pasta, the serving size refers to the amount of cooked product, not dry.  Check the total fat in packaged foods. Avoid foods that have saturated fat or trans fats.  Check the ingredients list for added sugars, such as corn syrup.   Shopping  At the grocery store, buy most of your food from the areas near the walls of the store. This includes: ? Fresh fruits and vegetables (produce). ? Grains, beans, nuts, and seeds. Some of these may be available in unpackaged forms or large amounts (in bulk). ? Fresh seafood. ? Poultry and eggs. ? Low-fat dairy products.  Buy whole ingredients instead of prepackaged foods.  Buy fresh fruits and vegetables in-season from local farmers markets.  Buy frozen fruits and vegetables in resealable bags.  If you do not have access to quality fresh seafood, buy precooked frozen shrimp or canned fish, such as tuna, salmon, or sardines.  Buy small amounts of raw or cooked vegetables, salads, or olives from the deli or salad bar at your store.  Stock your pantry so you always have certain foods on hand, such as olive oil, canned tuna, canned tomatoes, rice, pasta, and beans. Cooking  Cook foods with extra-virgin olive oil instead of using butter or other  vegetable oils.  Have meat as a side dish, and have vegetables or grains as your main dish. This means having meat in small portions or adding small amounts of meat to foods like pasta or stew.  Use beans or vegetables instead of meat in common dishes like chili or lasagna.  Experiment with different cooking methods. Try roasting or broiling vegetables instead of steaming or sauteing them.  Add frozen vegetables to soups, stews, pasta, or rice.  Add nuts or seeds for added healthy fat at each meal. You can add these to yogurt, salads, or vegetable dishes.  Marinate fish or vegetables using olive oil, lemon juice, garlic, and fresh herbs. Meal planning  Plan to eat 1 vegetarian meal one day each week. Try to work up to 2 vegetarian meals, if possible.  Eat seafood 2 or more times a week.  Have healthy snacks readily available, such as: ? Vegetable sticks with hummus. ? [place] yogurt. ? Fruit and nut trail mix.  Eat balanced meals throughout the week. This includes: ? Fruit: 2-3 servings a day ? Vegetables: 4-5 servings a day ? Low-fat dairy: 2 servings a day ? Fish, poultry, or lean meat: 1 serving a day ? Beans and legumes: 2 or more servings a week ? Nuts and seeds: 1-2 servings a day ? Whole grains: 6-8 servings a day ? Extra-virgin olive oil: 3-4 servings a day  Limit red meat and sweets to only a few servings a month   What are my food choices?  Mediterranean diet ? Recommended  Grains: Whole-grain pasta. Brown rice. Bulgar wheat. Polenta. Couscous. Whole-wheat bread. Orpah Cobb.  Vegetables: Artichokes. Beets. Broccoli. Cabbage. Carrots. Eggplant. Green beans. Chard. Kale. Spinach. Onions. Leeks. Peas. Squash. Tomatoes. Peppers. Radishes.  Fruits: Apples. Apricots. Avocado. Berries. Bananas. Cherries. Dates. Figs. Grapes. Lemons. Melon. Oranges. Peaches. Plums. Pomegranate.  Meats and other protein foods: Beans. Almonds. Sunflower seeds. Pine nuts. Peanuts.  Cod. Salmon. Scallops. Shrimp. Tuna. Tilapia. Clams. Oysters. Eggs.  Dairy: Low-fat milk. Cheese. Greek yogurt.  Beverages: Water. Red wine. Herbal tea.  Fats and oils: Extra virgin olive oil. Avocado oil. Grape seed oil.  Sweets and desserts: [place] yogurt with honey. Baked apples. Poached pears. Trail mix.  Seasoning and other foods: Basil. Cilantro. Coriander. Cumin. Mint. Parsley. Sage. Rosemary. Tarragon. Garlic. Oregano. Thyme. Pepper. Balsalmic vinegar. Tahini. Hummus. Tomato sauce. Olives. Mushrooms. ? Limit these  Grains: Prepackaged pasta or rice dishes. Prepackaged cereal with added sugar.  Vegetables: Deep fried potatoes (french fries).  Fruits: Fruit canned in syrup.  Meats and other protein foods: Beef. Pork. Lamb. Poultry with skin. Hot dogs. Tomasa Blase.  Dairy: Ice cream. Sour cream. Whole milk.  Beverages: Juice. Sugar-sweetened soft drinks. Beer. Liquor and spirits.  Fats and oils: Butter. Canola oil. Vegetable oil. Beef fat (tallow). Lard.  Sweets and desserts: Cookies. Cakes. Pies. Candy.  Seasoning and other foods: Mayonnaise. Premade sauces and marinades. The items listed may not be a complete list. Talk with your dietitian about what dietary choices are right for you. Summary  The Mediterranean diet includes both food and lifestyle choices.  Eat a variety of fresh fruits and vegetables, beans, nuts, seeds, and whole grains.  Limit the amount of red meat and sweets that you eat.  Talk with your health  care provider about whether it is safe for you to drink red wine in moderation. This means 1 glass a day for nonpregnant women and 2 glasses a day for men. A glass of wine equals 5 oz (150 mL). This information is not intended to replace advice given to you by your health care provider. Make sure you discuss any questions you have with your health care provider. Document Revised: 10/07/2015 Document Reviewed: 09/30/2015 Elsevier Patient Education  2020 [employer]     Signed, Meriam SpragueHeather E Pemberton, MD  05/17/2020 9:02 AM    [facility]

## 2020-05-17 ENCOUNTER — Encounter: Payer: Self-pay | Admitting: Cardiology

## 2020-05-17 ENCOUNTER — Other Ambulatory Visit: Payer: Self-pay

## 2020-05-17 ENCOUNTER — Ambulatory Visit: Payer: BC Managed Care – PPO | Admitting: Cardiology

## 2020-05-17 VITALS — BP 110/60 | HR 71 | Ht 66.0 in | Wt 199.6 lb

## 2020-05-17 DIAGNOSIS — Z79899 Other long term (current) drug therapy: Secondary | ICD-10-CM

## 2020-05-17 DIAGNOSIS — R079 Chest pain, unspecified: Secondary | ICD-10-CM | POA: Diagnosis not present

## 2020-05-17 DIAGNOSIS — R002 Palpitations: Secondary | ICD-10-CM | POA: Diagnosis not present

## 2020-05-17 DIAGNOSIS — I2510 Atherosclerotic heart disease of native coronary artery without angina pectoris: Secondary | ICD-10-CM

## 2020-05-17 DIAGNOSIS — E785 Hyperlipidemia, unspecified: Secondary | ICD-10-CM

## 2020-05-17 NOTE — Patient Instructions (Addendum)
Medication Instructions:   Your physician recommends that you continue on your current medications as directed. Please refer to the Current Medication list given to you today.  *If you need a refill on your cardiac medications before your next appointment, please call your pharmacy*   Lab Work:  IN 2 WEEKS, HERE AT THE OFFICE--WE WILL CHECK LIPIDS AT THAT TIME--PLEASE COME FASTING TO THIS LAB APPOINTMENT  If you have labs (blood work) drawn today and your tests are completely normal, you will receive your results only by: Marland Kitchen MyChart Message (if you have MyChart) OR . A paper copy in the mail If you have any lab test that is abnormal or we need to change your treatment, we will call you to review the results.    Follow-Up: At [facility], you and your health needs are our priority.  As part of our continuing mission to provide you with exceptional heart care, we have created designated Provider Care Teams.  These Care Teams include your primary Cardiologist (physician) and Advanced Practice Providers (APPs -  Physician Assistants and Nurse Practitioners) who all work together to provide you with the care you need, when you need it.  We recommend signing up for the patient portal called "MyChart".  Sign up information is provided on this After Visit Summary.  MyChart is used to connect with patients for Virtual Visits (Telemedicine).  Patients are able to view lab/test results, encounter notes, upcoming appointments, etc.  Non-urgent messages can be sent to your provider as well.   To learn more about what you can do with MyChart, go to [email or web address].    Your next appointment:   6 month(s)  The format for your next appointment:   In Person  Provider:   You will see one of the following Advanced Practice Providers on your designated Care Team:    Tereso Newcomer, PA-C  Vin [place], [place]       Other Instructions    Mediterranean Diet A Mediterranean diet refers to  food and lifestyle choices that are based on the traditions of countries located on the [place]. This way of eating has been shown to help prevent certain conditions and improve outcomes for people who have chronic diseases, like kidney disease and heart disease. What are tips for following this plan? Lifestyle  Cook and eat meals together with your family, when possible.  Drink enough fluid to keep your urine clear or pale yellow.  Be physically active every day. This includes: ? Aerobic exercise like running or swimming. ? Leisure activities like gardening, walking, or housework.  Get 7-8 hours of sleep each night.  If recommended by your health care provider, drink red wine in moderation. This means 1 glass a day for nonpregnant women and 2 glasses a day for men. A glass of wine equals 5 oz (150 mL). Reading food labels  Check the serving size of packaged foods. For foods such as rice and pasta, the serving size refers to the amount of cooked product, not dry.  Check the total fat in packaged foods. Avoid foods that have saturated fat or trans fats.  Check the ingredients list for added sugars, such as corn syrup.   Shopping  At the grocery store, buy most of your food from the areas near the walls of the store. This includes: ? Fresh fruits and vegetables (produce). ? Grains, beans, nuts, and seeds. Some of these may be available in unpackaged forms or large amounts (in bulk). ? Fresh  seafood. ? Poultry and eggs. ? Low-fat dairy products.  Buy whole ingredients instead of prepackaged foods.  Buy fresh fruits and vegetables in-season from local farmers markets.  Buy frozen fruits and vegetables in resealable bags.  If you do not have access to quality fresh seafood, buy precooked frozen shrimp or canned fish, such as tuna, salmon, or sardines.  Buy small amounts of raw or cooked vegetables, salads, or olives from the deli or salad bar at your store.  Stock your  pantry so you always have certain foods on hand, such as olive oil, canned tuna, canned tomatoes, rice, pasta, and beans. Cooking  Cook foods with extra-virgin olive oil instead of using butter or other vegetable oils.  Have meat as a side dish, and have vegetables or grains as your main dish. This means having meat in small portions or adding small amounts of meat to foods like pasta or stew.  Use beans or vegetables instead of meat in common dishes like chili or lasagna.  Experiment with different cooking methods. Try roasting or broiling vegetables instead of steaming or sauteing them.  Add frozen vegetables to soups, stews, pasta, or rice.  Add nuts or seeds for added healthy fat at each meal. You can add these to yogurt, salads, or vegetable dishes.  Marinate fish or vegetables using olive oil, lemon juice, garlic, and fresh herbs. Meal planning  Plan to eat 1 vegetarian meal one day each week. Try to work up to 2 vegetarian meals, if possible.  Eat seafood 2 or more times a week.  Have healthy snacks readily available, such as: ? Vegetable sticks with hummus. ? [place] yogurt. ? Fruit and nut trail mix.  Eat balanced meals throughout the week. This includes: ? Fruit: 2-3 servings a day ? Vegetables: 4-5 servings a day ? Low-fat dairy: 2 servings a day ? Fish, poultry, or lean meat: 1 serving a day ? Beans and legumes: 2 or more servings a week ? Nuts and seeds: 1-2 servings a day ? Whole grains: 6-8 servings a day ? Extra-virgin olive oil: 3-4 servings a day  Limit red meat and sweets to only a few servings a month   What are my food choices?  Mediterranean diet ? Recommended  Grains: Whole-grain pasta. Brown rice. Bulgar wheat. Polenta. Couscous. Whole-wheat bread. Orpah Cobbatmeal. Quinoa.  Vegetables: Artichokes. Beets. Broccoli. Cabbage. Carrots. Eggplant. Green beans. Chard. Kale. Spinach. Onions. Leeks. Peas. Squash. Tomatoes. Peppers. Radishes.  Fruits: Apples.  Apricots. Avocado. Berries. Bananas. Cherries. Dates. Figs. Grapes. Lemons. Melon. Oranges. Peaches. Plums. Pomegranate.  Meats and other protein foods: Beans. Almonds. Sunflower seeds. Pine nuts. Peanuts. Cod. Salmon. Scallops. Shrimp. Tuna. Tilapia. Clams. Oysters. Eggs.  Dairy: Low-fat milk. Cheese. Greek yogurt.  Beverages: Water. Red wine. Herbal tea.  Fats and oils: Extra virgin olive oil. Avocado oil. Grape seed oil.  Sweets and desserts: [place] yogurt with honey. Baked apples. Poached pears. Trail mix.  Seasoning and other foods: Basil. Cilantro. Coriander. Cumin. Mint. Parsley. Sage. Rosemary. Tarragon. Garlic. Oregano. Thyme. Pepper. Balsalmic vinegar. Tahini. Hummus. Tomato sauce. Olives. Mushrooms. ? Limit these  Grains: Prepackaged pasta or rice dishes. Prepackaged cereal with added sugar.  Vegetables: Deep fried potatoes (french fries).  Fruits: Fruit canned in syrup.  Meats and other protein foods: Beef. Pork. Lamb. Poultry with skin. Hot dogs. Tomasa BlaseBacon.  Dairy: Ice cream. Sour cream. Whole milk.  Beverages: Juice. Sugar-sweetened soft drinks. Beer. Liquor and spirits.  Fats and oils: Butter. Canola oil. Vegetable oil. Beef fat (tallow).  Lard.  Sweets and desserts: Cookies. Cakes. Pies. Candy.  Seasoning and other foods: Mayonnaise. Premade sauces and marinades. The items listed may not be a complete list. Talk with your dietitian about what dietary choices are right for you. Summary  The Mediterranean diet includes both food and lifestyle choices.  Eat a variety of fresh fruits and vegetables, beans, nuts, seeds, and whole grains.  Limit the amount of red meat and sweets that you eat.  Talk with your health care provider about whether it is safe for you to drink red wine in moderation. This means 1 glass a day for nonpregnant women and 2 glasses a day for men. A glass of wine equals 5 oz (150 mL). This information is not intended to replace advice given to you by  your health care provider. Make sure you discuss any questions you have with your health care provider. Document Revised: 10/07/2015 Document Reviewed: 09/30/2015 Elsevier Patient Education  2020 [employer].  Mediterranean Diet A Mediterranean diet refers to food and lifestyle choices that are based on the traditions of countries located on the [place]. This way of eating has been shown to help prevent certain conditions and improve outcomes for people who have chronic diseases, like kidney disease and heart disease. What are tips for following this plan? Lifestyle  Cook and eat meals together with your family, when possible.  Drink enough fluid to keep your urine clear or pale yellow.  Be physically active every day. This includes: ? Aerobic exercise like running or swimming. ? Leisure activities like gardening, walking, or housework.  Get 7-8 hours of sleep each night.  If recommended by your health care provider, drink red wine in moderation. This means 1 glass a day for nonpregnant women and 2 glasses a day for men. A glass of wine equals 5 oz (150 mL). Reading food labels  Check the serving size of packaged foods. For foods such as rice and pasta, the serving size refers to the amount of cooked product, not dry.  Check the total fat in packaged foods. Avoid foods that have saturated fat or trans fats.  Check the ingredients list for added sugars, such as corn syrup.   Shopping  At the grocery store, buy most of your food from the areas near the walls of the store. This includes: ? Fresh fruits and vegetables (produce). ? Grains, beans, nuts, and seeds. Some of these may be available in unpackaged forms or large amounts (in bulk). ? Fresh seafood. ? Poultry and eggs. ? Low-fat dairy products.  Buy whole ingredients instead of prepackaged foods.  Buy fresh fruits and vegetables in-season from local farmers markets.  Buy frozen fruits and vegetables in  resealable bags.  If you do not have access to quality fresh seafood, buy precooked frozen shrimp or canned fish, such as tuna, salmon, or sardines.  Buy small amounts of raw or cooked vegetables, salads, or olives from the deli or salad bar at your store.  Stock your pantry so you always have certain foods on hand, such as olive oil, canned tuna, canned tomatoes, rice, pasta, and beans. Cooking  Cook foods with extra-virgin olive oil instead of using butter or other vegetable oils.  Have meat as a side dish, and have vegetables or grains as your main dish. This means having meat in small portions or adding small amounts of meat to foods like pasta or stew.  Use beans or vegetables instead of meat in common dishes like chili  or lasagna.  Experiment with different cooking methods. Try roasting or broiling vegetables instead of steaming or sauteing them.  Add frozen vegetables to soups, stews, pasta, or rice.  Add nuts or seeds for added healthy fat at each meal. You can add these to yogurt, salads, or vegetable dishes.  Marinate fish or vegetables using olive oil, lemon juice, garlic, and fresh herbs. Meal planning  Plan to eat 1 vegetarian meal one day each week. Try to work up to 2 vegetarian meals, if possible.  Eat seafood 2 or more times a week.  Have healthy snacks readily available, such as: ? Vegetable sticks with hummus. ? [place] yogurt. ? Fruit and nut trail mix.  Eat balanced meals throughout the week. This includes: ? Fruit: 2-3 servings a day ? Vegetables: 4-5 servings a day ? Low-fat dairy: 2 servings a day ? Fish, poultry, or lean meat: 1 serving a day ? Beans and legumes: 2 or more servings a week ? Nuts and seeds: 1-2 servings a day ? Whole grains: 6-8 servings a day ? Extra-virgin olive oil: 3-4 servings a day  Limit red meat and sweets to only a few servings a month   What are my food choices?  Mediterranean diet ? Recommended  Grains: Whole-grain  pasta. Brown rice. Bulgar wheat. Polenta. Couscous. Whole-wheat bread. Orpah Cobb.  Vegetables: Artichokes. Beets. Broccoli. Cabbage. Carrots. Eggplant. Green beans. Chard. Kale. Spinach. Onions. Leeks. Peas. Squash. Tomatoes. Peppers. Radishes.  Fruits: Apples. Apricots. Avocado. Berries. Bananas. Cherries. Dates. Figs. Grapes. Lemons. Melon. Oranges. Peaches. Plums. Pomegranate.  Meats and other protein foods: Beans. Almonds. Sunflower seeds. Pine nuts. Peanuts. Cod. Salmon. Scallops. Shrimp. Tuna. Tilapia. Clams. Oysters. Eggs.  Dairy: Low-fat milk. Cheese. Greek yogurt.  Beverages: Water. Red wine. Herbal tea.  Fats and oils: Extra virgin olive oil. Avocado oil. Grape seed oil.  Sweets and desserts: [place] yogurt with honey. Baked apples. Poached pears. Trail mix.  Seasoning and other foods: Basil. Cilantro. Coriander. Cumin. Mint. Parsley. Sage. Rosemary. Tarragon. Garlic. Oregano. Thyme. Pepper. Balsalmic vinegar. Tahini. Hummus. Tomato sauce. Olives. Mushrooms. ? Limit these  Grains: Prepackaged pasta or rice dishes. Prepackaged cereal with added sugar.  Vegetables: Deep fried potatoes (french fries).  Fruits: Fruit canned in syrup.  Meats and other protein foods: Beef. Pork. Lamb. Poultry with skin. Hot dogs. Tomasa Blase.  Dairy: Ice cream. Sour cream. Whole milk.  Beverages: Juice. Sugar-sweetened soft drinks. Beer. Liquor and spirits.  Fats and oils: Butter. Canola oil. Vegetable oil. Beef fat (tallow). Lard.  Sweets and desserts: Cookies. Cakes. Pies. Candy.  Seasoning and other foods: Mayonnaise. Premade sauces and marinades. The items listed may not be a complete list. Talk with your dietitian about what dietary choices are right for you. Summary  The Mediterranean diet includes both food and lifestyle choices.  Eat a variety of fresh fruits and vegetables, beans, nuts, seeds, and whole grains.  Limit the amount of red meat and sweets that you eat.  Talk with  your health care provider about whether it is safe for you to drink red wine in moderation. This means 1 glass a day for nonpregnant women and 2 glasses a day for men. A glass of wine equals 5 oz (150 mL). This information is not intended to replace advice given to you by your health care provider. Make sure you discuss any questions you have with your health care provider. Document Revised: 10/07/2015 Document Reviewed: 09/30/2015 Elsevier Patient Education  2020 [employer].

## 2020-05-25 ENCOUNTER — Ambulatory Visit (INDEPENDENT_AMBULATORY_CARE_PROVIDER_SITE_OTHER): Payer: BC Managed Care – PPO

## 2020-05-25 ENCOUNTER — Other Ambulatory Visit: Payer: Self-pay

## 2020-05-25 DIAGNOSIS — Z23 Encounter for immunization: Secondary | ICD-10-CM | POA: Diagnosis not present

## 2020-05-25 NOTE — Progress Notes (Signed)
Patient was given 2nd dose of SHINGRIX, tolerated well.

## 2020-06-02 ENCOUNTER — Telehealth: Payer: Self-pay | Admitting: Cardiology

## 2020-06-02 DIAGNOSIS — R42 Dizziness and giddiness: Secondary | ICD-10-CM

## 2020-06-02 DIAGNOSIS — I2510 Atherosclerotic heart disease of native coronary artery without angina pectoris: Secondary | ICD-10-CM

## 2020-06-02 DIAGNOSIS — R55 Syncope and collapse: Secondary | ICD-10-CM

## 2020-06-02 DIAGNOSIS — H539 Unspecified visual disturbance: Secondary | ICD-10-CM

## 2020-06-02 DIAGNOSIS — R29818 Other symptoms and signs involving the nervous system: Secondary | ICD-10-CM

## 2020-06-02 NOTE — Telephone Encounter (Signed)
Can we schedule him for an echo to ensure his heart pumping function and valves look okay. We should also refer to Neuro for further work-up as well. Ensure he is hydrated and watch his blood pressure to ensure not too low (SBP<100) or too high (SBP>160).

## 2020-06-02 NOTE — Telephone Encounter (Signed)
Spoke with the pt and informed him of Dr. Devin Going recommendations for him to have an echo and refer to Neuro for further assessment of complaints.  Also informed the pt that per Dr. Shari Prows, she wants him to make sure he is staying plenty hydrated and he should watch his BP, with parameters provided to the pt.  Informed the pt that I will put the echo and referral in the system, and send a message to our [facility] schedulers to call him back and arrange this appt.  Pt verbalized understanding and agrees with this plan.

## 2020-06-02 NOTE — Telephone Encounter (Signed)
Spoke with pt and earlier today had a syncopal episode was out for a minute or so and this has never happened before. Per pt prior to passing out had visual disturbances (aura and vision was fuzzy) EMS was called and B/P was 130/90 after coming to  Per pt usually notes visual disturbances 1 time a year  that do not last long then go away, but passed out with this episode Pt previously had monitor which was normal .Will forward to Dr Shari Prows for review and recommendations ./cy

## 2020-06-02 NOTE — Telephone Encounter (Signed)
Pt c/o Syncope: STAT if syncope occurred within 30 minutes and pt complains of lightheadedness High Priority if episode of passing out, completely, today or in last 24 hours   1. Did you pass out today? Yes, around 10:00 AM today   2. When is the last time you passed out? Around 10:00 AM today   3. Has this occurred multiple times?  Not recently. Patient states he only has these episodes about once a year.  4. Did you have any symptoms prior to passing out?  no  BP: 130/90

## 2020-06-02 NOTE — Telephone Encounter (Signed)
needs echo and referral to Neuro per Shari Prows Received: Today Loma Sender, LPN I have scheduled for 06/30/20 for echocardiogrm. :) I also have him on the waiting list if cancellation. :)

## 2020-06-03 ENCOUNTER — Telehealth: Payer: Self-pay | Admitting: *Deleted

## 2020-06-03 ENCOUNTER — Other Ambulatory Visit: Payer: BC Managed Care – PPO | Admitting: *Deleted

## 2020-06-03 ENCOUNTER — Other Ambulatory Visit: Payer: Self-pay

## 2020-06-03 ENCOUNTER — Telehealth: Payer: Self-pay | Admitting: Cardiology

## 2020-06-03 ENCOUNTER — Ambulatory Visit (HOSPITAL_COMMUNITY): Payer: BC Managed Care – PPO | Attending: Cardiology

## 2020-06-03 DIAGNOSIS — R42 Dizziness and giddiness: Secondary | ICD-10-CM

## 2020-06-03 DIAGNOSIS — Z79899 Other long term (current) drug therapy: Secondary | ICD-10-CM

## 2020-06-03 DIAGNOSIS — E785 Hyperlipidemia, unspecified: Secondary | ICD-10-CM

## 2020-06-03 DIAGNOSIS — I2510 Atherosclerotic heart disease of native coronary artery without angina pectoris: Secondary | ICD-10-CM | POA: Diagnosis not present

## 2020-06-03 DIAGNOSIS — R55 Syncope and collapse: Secondary | ICD-10-CM | POA: Insufficient documentation

## 2020-06-03 LAB — LIPID PANEL
Chol/HDL Ratio: 3.6 ratio (ref 0.0–5.0)
Cholesterol, Total: 187 mg/dL (ref 100–199)
HDL: 52 mg/dL (ref >39)
LDL Chol Calc (NIH): 117 mg/dL — ABNORMAL HIGH (ref 0–99)
Triglycerides: 102 mg/dL (ref 0–149)
VLDL Cholesterol Cal: 18 mg/dL (ref 5–40)

## 2020-06-03 LAB — ECHOCARDIOGRAM COMPLETE
Area-P 1/2: 3.23 cm2
S' Lateral: 2.35 cm

## 2020-06-03 NOTE — Telephone Encounter (Signed)
Pt walked in today concerned of syncopal event and sooner evaluation. See telephone note from yesterday. Was able to have echo completed while here this morning,  d/t pt cancellation. Called and got pt w/ [facility] next Wednesday. Pt and wife very grateful for the help with this.

## 2020-06-03 NOTE — Telephone Encounter (Signed)
Pt will see Neuro for consult on 4/20.  He will be seeing Dr. Frances Furbish for consultation. Pt made aware of appt date and time by [facility] scheduling.

## 2020-06-03 NOTE — Telephone Encounter (Signed)
Called and spoke to the patient and his wife about the syncopal event.  The patient was sitting in a restaurant about to eat when he felt sudden onset of "feeling like he was going into a hole" with dizziness and profound fatigue. He then lost consciousness for 20-25seconds before returning back to baseline mental status. No preceding chest pain, palpitations, SOB. [place] had [place] 11 mile bike ride the day before. Has had several similar episodes in the past but has not lost consciousness with them. Previously saw neuro where head imaging and EEG was negative. Has undergone cardiac monitoring which was negative for significant arrhythmia, TTE which showed normal LVEF, normal RV, no valve disease; and calcium score which was 166. Suspect given recurrent episodes, this is related to a vagal event. However, will ensure no evidence of PE and obtain CTA. He currently feels tired but no chest pain, SOB, DOE, orthopnea or PND. If needed, we can also proceed with coronary CTA/myoview however given that the patient is very active without exercise limitations, I do not suspect he has significant CAD. He also has neuro follow-up next week. I counseled him about monitoring his blood pressure and keeping a log, ensure he has adequate fluid intake, compression socks, not skipping meals, and to not drive at this time. He understands and will keep me posted.   Laurance Flatten, MD

## 2020-06-04 ENCOUNTER — Ambulatory Visit (INDEPENDENT_AMBULATORY_CARE_PROVIDER_SITE_OTHER)
Admission: RE | Admit: 2020-06-04 | Discharge: 2020-06-04 | Disposition: A | Discharge status: Home / Self Care | Payer: BC Managed Care – PPO | Source: Ambulatory Visit | Attending: Cardiology | Admitting: Cardiology

## 2020-06-04 DIAGNOSIS — R55 Syncope and collapse: Secondary | ICD-10-CM

## 2020-06-04 MED ORDER — IOHEXOL 350 MG/ML SOLN
80.0000 mL | Freq: Once | INTRAVENOUS | Status: AC | PRN
  Administered 2020-06-04: 80 mL via INTRAVENOUS

## 2020-06-04 NOTE — Telephone Encounter (Signed)
-----   Message from [name], MD sent at 06/03/2020  6:32 PM EDT ----- Can we order a CTA PE protocol to just ensure he does not have a PE. I do not think this is the case but he is very concerned about his syncopal episode and this would be something that we do not want to miss.   Thank you so much!!

## 2020-06-04 NOTE — Telephone Encounter (Signed)
Scheduled for cta chest today 2:15 pm.  Pt aware to hydrate and nothing else but water by mouth.

## 2020-06-07 ENCOUNTER — Telehealth: Payer: Self-pay

## 2020-06-07 DIAGNOSIS — E785 Hyperlipidemia, unspecified: Secondary | ICD-10-CM

## 2020-06-07 MED ORDER — ROSUVASTATIN CALCIUM 20 MG PO TABS: 40.0000 mg | ORAL_TABLET | Freq: Every day | ORAL | 3 refills | Status: DC

## 2020-06-07 NOTE — Telephone Encounter (Signed)
-----   Message from [name], MD sent at 06/05/2020  7:59 AM EDT ----- His cholesterol is still above goal at 117. Can we increase his crestor to 40mg  daily and then repeat lipids in 6-8 weeks.

## 2020-06-07 NOTE — Telephone Encounter (Signed)
Call placed to Pt.  Advised to increase Crestor to 40 mg daily.    Lipid panel scheduled for August 10, 2020.  Pt indicates understanding and agreement.

## 2020-06-09 ENCOUNTER — Encounter: Payer: Self-pay | Admitting: Neurology

## 2020-06-09 ENCOUNTER — Ambulatory Visit: Payer: BC Managed Care – PPO | Admitting: Neurology

## 2020-06-09 VITALS — BP 114/78 | HR 82 | Ht 66.0 in | Wt 200.0 lb

## 2020-06-09 DIAGNOSIS — R55 Syncope and collapse: Secondary | ICD-10-CM

## 2020-06-09 DIAGNOSIS — H539 Unspecified visual disturbance: Secondary | ICD-10-CM

## 2020-06-09 DIAGNOSIS — R42 Dizziness and giddiness: Secondary | ICD-10-CM

## 2020-06-09 DIAGNOSIS — R29818 Other symptoms and signs involving the nervous system: Secondary | ICD-10-CM

## 2020-06-09 NOTE — Patient Instructions (Addendum)
It was good to see you again today.  I am sorry you had a recent episode of syncope.  Your neurological exam is normal thankfully.  As discussed, we will proceed with a brain MRI with and without contrast as well as an EEG, we have had these test done before in this office but we can certainly repeat testing to make sure there is no change.  So long as your test results are benign, you can follow-up as needed.  You can consider a second opinion with a academic neurology team, talk to your primary care physician about this. Unfortunately, I do not have a good explanation for your passing out spell.  Please talk to your cardiologist about the possibility of investigation with a loop recorder which can monitor heart rate variability and screen for irregular heartbeat as well as low heart rate for a longer period of time.

## 2020-06-09 NOTE — Progress Notes (Signed)
Subjective:    Patient ID: Trellis PaganiniKevin Pfannenstiel is a 60 y.o. male.  HPI     Interim history:   Mr. Duwayne HeckBrekka is a 60 year old right-handed gentleman with an underlying medical history of hyperlipidemia, vitamin D deficiency, hearing loss, allergic rhinitis, obesity, and sleep apnea, who presents for reevaluation of his presyncopal symptoms, and recent syncope.  The patient is unaccompanied today.  I last evaluated him for similar symptoms a year ago in March.  He had prior work-up through this office several years ago including EEG and MRI.  He has had intermittent headaches but not frequent enough to warrant migraine prevention medication.  He was encouraged to seek reevaluation by his cardiologist recently.  He had cardiac work-up Including heart monitor and CT angiogram of the chest which was negative for any PE. TTE on 06/03/2020 showed benign findings, EF of 55-60%, no significant left ventricular wall motion abnormalities, no significant valvular disease.  Today, 06/09/2020 (all dictated new, as well as above notes, some dictation done in note pad or Word, outside of chart, may appear as copied):   He reports that he had a passing out spell about a week ago.  He was on vacation, he was sitting at the table.  He had had something to drink before, no prodromal illness.  He feels his symptoms come on briefly, most of the time he does not have any actual loss of consciousness at this time he actually passed out and was out for about a minute per family.  He did not have any twitching or convulsion, no tongue bite, no bowel or bladder incontinence, had some headache afterwards.  Symptoms are infrequent.  Most of the time they happen in the first part of the day and while he is seated.  He denies any sudden onset of one-sided weakness or numbness or tingling or droopy face or slurring of speech.  He does not have recurrent headaches, he sleeps with his CPAP.  He tries to hydrate well with water, does not drink any  alcohol.  He is wondering if he can have additional cardiac work-up.  He has discussed EP evaluation.  He has heard loop recorder.  He had blood work through his primary care on 03/30/2020 and I reviewed the results.  Vitamin D was on the lower end of normal at 32.7, estrogen level elevated at 242.6, hepatitis C nonreactive, PSA 0.9, A1c 5.5, cholesterol 245, LDL 170, BUN 19, creatinine 1.02, TSH 1.64.  He did have an increase in his cholesterol medicine recently.  The patient's allergies, current medications, family history, past medical history, past social history, past surgical history and problem list were reviewed and updated as appropriate.   Previously:   05/19/19: (He) reports intermittent dizziness or lightheadedness lasting anywhere from 30 seconds to 2 minutes.  The symptoms have recurred since 2002.  They are not progressive, in fact they are infrequent, happens every 6 to 12 months at this point.  He does have a drained feeling afterwards and feels tired, has a throbbing fairly generalized headache that is not debilitating but enough to take Advil or Tylenol at the time, some nausea at times, no vomiting, no photophobia, not one-sided, no other neurological accompaniments, no history of loss of consciousness or fall with these, no involuntary movements or loss of time.     He has been prediabetic for some years, wonders if sometimes the blood sugar fluctuates and causes him to feel like this.  He certainly has not had any new  neurological symptoms.  He is here to round out his evaluation from 6 years ago.  He has not had any new symptoms since then.  He does not have any visual aura in keeping with migraine aura.  His sister does have migraine headaches.  He hydrates well with water, he is working on weight loss, he goes to [employer] for this.  He denies any orthostatic lightheadedness.   I have previously evaluated him for intermittent dizzy spells and headaches.  He was seen several years ago  for this and reported episodic lightheadedness and dizziness as well as nausea and headache.  He was advised to proceed with an EEG and brain MRI. He had an EEG on 11/19/2012 and I reviewed the results: CONCLUSION: This is a  normal awake and asleep EEG.  There is no electrodiagnostic evidence of epileptiform discharge.    He had a brain MRI with and without contrast on 11/20/2012 and I reviewed the results: IMPRESSION:  Equivocal MRI brain (with and without) demonstrating minimal periventricular gliosis, with a single punctate right frontal juxtacortical focus of gliosis. Otherwise unremarkable study.     For his sleep apnea he is on CPAP therapy and reports being compliant with treatment, he has a sleep specialist.    Previously:    11/11/12: (He) has a several year history of intermittent lightheadedness and dizziness associated with nausea followed by a headache. His first episode was in 2002 or 2003, and consisted of a sensation coming on in his head and it feels, as if he is about to pass out. He becomes pale, does not feel like talking, but there is no slurring of speech. No change in vision, as in, no diplopia, no constriction in his VF, no blurry vision. He has associated nausea. This lasts for 1-4 minutes, and is followed by a generalized headache, which a tightness-like sensation, no photo or sonophobia, no N/V at the time. He HA lasts all day. He feels very exhausted afterwards.  Frequency was very sporadic, every other year and he had a very extensive, mostly cardiac w/u at the time. In the last 7 weeks he has had 2 episodes, which is unusual and disturbing to him. He does not smoke or drink, no additional stressors, no changes in meds, no lifestyle changes.  His 64 yo son has epilepsy since infancy. He himself has never had a twitching spell or convulsion or staring spell.  He has a FHx of migraine only in one sister; he is one of 10 children. Of note, he has R ear hearing loss since  infancy; he started having a tuft of grey hair since age 66 and was suspected to have Waardenburg Syndrome.  He does not remember if he had an MRI brain or EEG in the past. He has had mild memory loss. He moved to [place] in 6/12. 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 He has a FHx of AD in his father and both paternal grandparents. Father passed away at age 44 with a sudden MI. Mother is 25 and in good health. No FHx of stroke. Never had TIA or stroke symptoms, denying sudden onset of one sided weakness, numbness, tingling, slurring of speech or droopy face, new hearing loss, tinnitus, diplopia or visual field cut or monocular loss of vision.    His Past Medical History Is Significant For: Past Medical History:  Diagnosis Date  . Deafness in right ear   . Family history of pancreatic cancer   .  Family history of prostate cancer   . Family history of skin cancer   . Sleep apnea     His Past Surgical History Is Significant For: Past Surgical History:  Procedure Laterality Date  . NASAL SEPTUM SURGERY      His Family History Is Significant For: Family History  Problem Relation Age of Onset  . Heart attack Father   . Pancreatic cancer Maternal Grandmother        dx 33s, d 83s  . Skin cancer Sister   . Prostate cancer Brother        dx 46  . Prostate cancer Brother        dx 32, metastatic, d 25    His Social History Is Significant For: Social History   Socioeconomic History  . Marital status: Married    Spouse name: [name]  . Number of children: 4  . Years of education: Law  . Highest education level: Not on file  Occupational History  . Occupation: [employer]    Comment: [employer]  Tobacco Use  . Smoking status: Never Smoker  . Smokeless tobacco: Never Used  Substance and Sexual Activity  . Alcohol use: No    Comment: quit: 2011  . Drug use: No  . Sexual activity: Not on file  Other Topics Concern  . Not on file  Social History Narrative   Right handed   Patient  lives at home with his family.   Caffeine Use: 2 cups daily   Social Determinants of Health   Financial Resource Strain: Not on file  Food Insecurity: Not on file  Transportation Needs: Not on file  Physical Activity: Not on file  Stress: Not on file  Social Connections: Not on file    His Allergies Are:  Allergies  Allergen Reactions  . Bee Venom   :   His Current Medications Are:  Outpatient Encounter Medications as of 06/09/2020  Medication Sig  . aspirin EC 81 MG tablet Take 1 tablet (81 mg total) by mouth daily. Swallow whole.  . rosuvastatin (CRESTOR) 20 MG tablet Take 2 tablets (40 mg total) by mouth daily.  . [DISCONTINUED] Misc Natural Products (NF FORMULAS TESTOSTERONE PO) Take by mouth every 14 (fourteen) days.   No facility-administered encounter medications on file as of 06/09/2020.  :  Review of Systems:  Out of a complete 14 point review of systems, all are reviewed and negative with the exception of these symptoms as listed below: Review of Systems  Neurological:       Rm 2, alone. Internal referral from [name], MD for dizziness, syncope, aura's, visual disturbances. Had a fainting episode last Wed. Family was with him. He was out for about a min. Was confused when he woke up. This was first syncopal episode he's ever had. Before event, he had aura (feels like everything is dropping/BP drops?). He has had this aura occur for years but has never fainted. He normally can talk through it but feels nauseated.  Aura occurs cnce every 18 months. He has had no episodes since last Wed.    Objective:  Neurological Exam  Physical Exam Physical Examination:   Vitals:   06/09/20 1310  BP: 114/78  Pulse: 82  SpO2: 97%    General Examination: The patient is a very pleasant 60 y.o. male in no acute distress. He appears well-developed and well-nourished and well groomed.   HEENT: Normocephalic, atraumatic, pupils are equal, round and reactive to light,  extraocular tracking  well-preserved, mild conjunctival redness noted.  Hearing loss on the right, which is stable.  Normal hearing on the left.  Face is symmetric with normal facial animation and normal facial sensation. Speech is clear with no dysarthria noted. There is no hypophonia. There is no lip, neck/head, jaw or voice tremor. Neck is supple with full range of passive and active motion. There are no carotid bruits on auscultation. Oropharynx exam reveals: Small mouth opening, mild mouth dryness, tongue protrudes centrally and palate elevates symmetrically.    Chest: Clear to auscultation without wheezing, rhonchi or crackles noted.  Heart: S1+S2+0, regular and normal without murmurs, rubs or gallops noted.   Abdomen: Soft, non-tender and non-distended.  Extremities: There is no pitting edema in the distal lower extremities bilaterally.  Skin: Warm and dry without trophic changes noted.  Musculoskeletal: exam reveals no obvious joint deformities, tenderness or joint swelling or erythema.   Neurologically:  Mental status: The patient is awake, alert and oriented in all 4 spheres. His immediate and remote memory, attention, language skills and fund of knowledge are appropriate. There is no evidence of aphasia, agnosia, apraxia or anomia. Speech is clear with normal prosody and enunciation. Thought process is linear. Mood is normal and affect is normal.  Cranial nerves II - XII are as described above under HEENT exam.  Motor exam: Normal bulk, strength and tone is noted. There is no drift, tremor or rebound. Romberg is negative. Reflexes are 2+ throughout. Babinski: Toes are flexor bilaterally. Fine motor skills and coordination: intact with normal finger taps, normal hand movements, normal rapid alternating patting, normal foot taps and normal foot agility.  Cerebellar testing: No dysmetria or intention tremor on finger to nose testing. Heel to shin is unremarkable bilaterally. There is  no truncal or gait ataxia.  Sensory exam: intact to light touch, vibration, and temperature sense in the upper and lower extremities.  Gait, station and balance: He stands easily.  No orthostatic lightheadedness, no vertiginous symptoms.  No veering to one side is noted. No leaning to one side is noted. Posture is age-appropriate and stance is narrow based. Gait shows normal stride length and normal pace. No problems turning are noted. Tandem walk is unremarkable.   Assessment and Plan:   In summary, Massai Hankerson is a very pleasant 60 year old male with an underlying medical history of hyperlipidemia, vitamin D deficiency, hearing loss, allergic rhinitis, obesity, and sleep apnea, on PAP therapy, who presents for re-evaluation of his intermittent presyncopal symptoms, and a recent syncopal spell about a week ago.  He had evaluation for this in the past, and I had seen him some 7 years ago.  His exam is nonfocal and he is reassured.  Previous brain MRI and EEG were benign.  He does not have any significant headaches.  He does not have any visual symptoms like visual auras.  He does not have orthostatic lightheadedness and description of the event is not in keeping with a seizure-like event.  Nevertheless, I suggest we proceed with repeat brain MRI and EEG at this time.  He is encouraged to talk to his cardiologist about the possibility of doing a loop recorder which can stay in place and record heart rate variability and rhythm for longer period of time.  Differential diagnosis does include vasovagal syncope but symptoms primarily happen when he is seated and earlier in the day, after resting.  Symptomatic bradycardia could also be within the possibilities.   He is largely reassured today.  He is  reminded to stay well-hydrated, well rested and stay compliant with his CPAP.  He is advised that we can call him with his brain MRI and EEG results and so long as these are benign we can have him follow-up in this  clinic as needed.  He is encouraged to talk to his primary care physician about getting another opinion with another neurology practice, preferably a academic neurology department for evaluation of these spells.  Thankfully, they have been rather infrequent.  He also reports that evaluation through electrophysiology has been discussed through cardiology.  This is certainly also an option.  He is advised to touch base with his cardiologist and we will call him to schedule his MRI and also with the results as far as the MRI and EEG are concerned.  I answered all his questions today and he was in agreement. I spent 42 minutes in total face-to-face time and in reviewing records during pre-charting, more than 50% of which was spent in counseling and coordination of care, reviewing test results, reviewing medications and treatment regimen and/or in discussing or reviewing the diagnosis of syncope, the prognosis and treatment options. Pertinent laboratory and imaging test results that were available during this visit with the patient were reviewed by me and considered in my medical decision making (see chart for details).

## 2020-06-10 NOTE — Telephone Encounter (Signed)
04.21.22 10:06am-LMOM for pt to call and schedule appt with EP for syncope and to discuss possible loop recorder implantation per Dr. Shari Prows ae

## 2020-06-10 NOTE — Telephone Encounter (Signed)
Referral to EP placed in the system, per Dr. Shari Prows.  EP referral for syncope and to discuss possible loop recorder implantation.  Referral is in and pt is aware our EP [facility], will call him back to arrange.

## 2020-06-11 NOTE — Telephone Encounter (Signed)
Pt will see Dr. Elberta Fortis in EP for consideration of loop recorder implantation, on 07/06/20 at 0900.  Pt made aware of appt date and time by EP [facility].

## 2020-06-11 NOTE — Telephone Encounter (Signed)
Pt scheduled to see Dr. Elberta Fortis in EP for consideration of ILR, on 07/06/20 at 0900.  Pt made aware of appt date and time by EP [facility].

## 2020-06-14 ENCOUNTER — Telehealth: Payer: Self-pay | Admitting: Neurology

## 2020-06-14 NOTE — Telephone Encounter (Signed)
no to the covid questions MR Brain w/wo contrast Dr. Alinda Sierras Berkley Harvey: [phone] (exp. 06/14/20 to 12/10/20). Patient is scheduled at [facility] for 06/29/20.

## 2020-06-28 ENCOUNTER — Other Ambulatory Visit: Payer: Self-pay

## 2020-06-28 ENCOUNTER — Ambulatory Visit: Payer: BC Managed Care – PPO

## 2020-06-28 DIAGNOSIS — R42 Dizziness and giddiness: Secondary | ICD-10-CM

## 2020-06-28 DIAGNOSIS — R55 Syncope and collapse: Secondary | ICD-10-CM | POA: Diagnosis not present

## 2020-06-29 ENCOUNTER — Other Ambulatory Visit: Payer: Self-pay

## 2020-06-29 ENCOUNTER — Ambulatory Visit: Payer: BC Managed Care – PPO

## 2020-06-29 DIAGNOSIS — R55 Syncope and collapse: Secondary | ICD-10-CM | POA: Diagnosis not present

## 2020-06-29 DIAGNOSIS — R42 Dizziness and giddiness: Secondary | ICD-10-CM | POA: Diagnosis not present

## 2020-06-29 MED ORDER — GADOBENATE DIMEGLUMINE 529 MG/ML IV SOLN
20.0000 mL | Freq: Once | INTRAVENOUS | Status: AC | PRN
  Administered 2020-06-29: 20 mL via INTRAVENOUS

## 2020-06-30 ENCOUNTER — Other Ambulatory Visit (HOSPITAL_COMMUNITY): Payer: BC Managed Care – PPO

## 2020-07-01 ENCOUNTER — Telehealth: Payer: Self-pay

## 2020-07-01 NOTE — Telephone Encounter (Signed)
-----   Message from Huston Foley, MD sent at 07/01/2020  4:37 PM EDT ----- Please call patient and advise him that his brain MRI with and without contrast was reported as normal.  We will keep him posted as to his EEG results once they are available.

## 2020-07-01 NOTE — Progress Notes (Signed)
Please call patient and advise him that his brain MRI with and without contrast was reported as normal.  We will keep him posted as to his EEG results once they are available.

## 2020-07-01 NOTE — Telephone Encounter (Signed)
I called pt and advised of MRI report. Pt verbalized understanding and I advised we would CB with EEG results.

## 2020-07-06 ENCOUNTER — Ambulatory Visit: Payer: BC Managed Care – PPO | Admitting: Cardiology

## 2020-07-06 ENCOUNTER — Encounter: Payer: Self-pay | Admitting: Cardiology

## 2020-07-06 ENCOUNTER — Other Ambulatory Visit: Payer: Self-pay

## 2020-07-06 VITALS — BP 110/68 | HR 72 | Ht 66.0 in | Wt 203.0 lb

## 2020-07-06 DIAGNOSIS — R55 Syncope and collapse: Secondary | ICD-10-CM

## 2020-07-06 NOTE — Progress Notes (Signed)
Electrophysiology Office Note   Date:  07/06/2020   ID:  Brandon Madden, DOB 1960-11-20, MRN [id number]  PCP:  Brandon Madden, Brandon G, MD  Cardiologist: Brandon Madden Primary Electrophysiologist:  Brandon Prell Jorja Loa, MD    Chief Complaint: Syncope   History of Present Illness: Brandon Madden is a 60 y.o. male who is being seen today for the evaluation of syncope at the request of Brandon Sprague, MD. Presenting today for electrophysiology evaluation.  He has a history of hypertension and recent COVID infection.  He had an episode of syncope.  He was sitting in a restaurant when he suddenly felt like he was going into a hole.  He had dizziness and profound fatigue.  He lost consciousness for 20 to 25 seconds before returning back to baseline.  He had no chest pain, palpitations, or shortness of breath.  The day before, he was [place] the [place] and completed an 11 mile bike ride.  He had several similar episodes but had not lost consciousness.  He was seen by neurology with a normal work-up.  Cardiac monitoring showed no significant arrhythmia and an echo was performed which was normal.  Today, he denies symptoms of palpitations, chest pain, shortness of breath, orthopnea, PND, lower extremity edema, claudication, dizziness, presyncope, syncope, bleeding, or neurologic sequela. The patient is tolerating medications without difficulties.  He has had multiple episodes of near syncope in the past, but has not had syncope.  These occur months apart.  In between episodes, he feels well without complaint.   Past Medical History:  Diagnosis Date  . Deafness in right ear   . Family history of pancreatic cancer   . Family history of prostate cancer   . Family history of skin cancer   . Sleep apnea    Past Surgical History:  Procedure Laterality Date  . NASAL SEPTUM SURGERY       Current Outpatient Medications  Medication Sig Dispense Refill  . aspirin EC 81 MG tablet Take 1 tablet (81 mg total) by  mouth daily. Swallow whole. 90 tablet 3  . rosuvastatin (CRESTOR) 20 MG tablet Take 2 tablets (40 mg total) by mouth daily. 180 tablet 3   No current facility-administered medications for this visit.    Allergies:   Bee venom   Social History:  The patient  reports that he has never smoked. He has never used smokeless tobacco. He reports that he does not drink alcohol and does not use drugs.   Family History:  The patient's family history includes Heart attack in his father; Pancreatic cancer in his maternal grandmother; Prostate cancer in his brother and brother; Skin cancer in his sister.    ROS:  Please see the history of present illness.   Otherwise, review of systems is positive for none.   All other systems are reviewed and negative.    PHYSICAL EXAM: VS:  BP 110/68   Pulse 72   Ht 5\' 6"  (1.676 m)   Wt 203 lb (92.1 kg)   SpO2 98%   BMI 32.77 kg/m  , BMI Body mass index is 32.77 kg/m. GEN: Well nourished, well developed, in no acute distress  HEENT: normal  Neck: no JVD, carotid bruits, or masses Cardiac: RRR; no murmurs, rubs, or gallops,no edema  Respiratory:  clear to auscultation bilaterally, normal work of breathing GI: soft, nontender, nondistended, + BS MS: no deformity or atrophy  Skin: warm and dry Neuro:  Strength and sensation are intact Psych: euthymic mood, full affect  EKG:  EKG is not ordered today. Personal review of the ekg ordered 03/30/20 shows sinus rhythm, rate 72  Recent Labs: 03/30/2020: ALT 52; BUN 19; Creatinine, Ser 1.02; Potassium 4.5; Sodium 137; TSH 1.64    Lipid Panel     Component Value Date/Time   CHOL 187 06/03/2020 0812   TRIG 102 06/03/2020 0812   HDL 52 06/03/2020 0812   CHOLHDL 3.6 06/03/2020 0812   CHOLHDL 5 03/30/2020 1011   VLDL 24.2 03/30/2020 1011   LDLCALC 117 (H) 06/03/2020 0812     Wt Readings from Last 3 Encounters:  07/06/20 203 lb (92.1 kg)  06/09/20 200 lb (90.7 kg)  05/17/20 199 lb 9.6 oz (90.5 kg)       Other studies Reviewed: Additional studies/ records that were reviewed today include: TTE 06/03/20  Review of the above records today demonstrates:  1. Left ventricular ejection fraction, by estimation, is 55 to 60%. The  left ventricle has normal function. The left ventricle has no regional  wall motion abnormalities. Left ventricular diastolic parameters are  consistent with Grade I diastolic  dysfunction (impaired relaxation).  2. Right ventricular systolic function is normal. The right ventricular  size is normal.  3. The mitral valve is normal in structure. Trivial mitral valve  regurgitation.  4. The aortic valve is normal in structure. There is mild thickening of  the aortic valve. Aortic valve regurgitation is trivial. No aortic  stenosis is present.   Cardiac monitor 04/29/2020 personally reviewed  Patch wear time was 13 days and 9 hours  Predominant rhythm was NSR with average HR 84 (ranged from 48-172bpm)  Rare SVE, rare PVCs <1%  No Afib, significant pauses or sustained arrhythmias  Triggered events mainly correlated with PACs.  Overall, normal cardiac monitor    ASSESSMENT AND PLAN:  1.  Syncope: Episode sounded unprovoked.  He wore a cardiac monitor that showed no major abnormalities.  He has had a normal echo and a normal neurologic work-up.  He did not have any episodes of syncope wearing the monitor.  Due to that, we Brandon Madden plan for Brandon Madden monitor implant for further monitoring.  Risk and benefits of been discussed.  Risk include bleeding and infection.  The patient understands his risks and is agreed to the procedure.  Case discussed with referring cardiologist  Current medicines are reviewed at length with the patient today.   The patient does not have concerns regarding his medicines.  The following changes were made today:  none  Labs/ tests ordered today include:  No orders of the defined types were placed in this encounter.    Disposition:   FU  with Brandon Madden pending link monitor results  Signed, Brandon Madden Jorja Loa, MD  07/06/2020 9:42 AM     [facility] [address] [place] [address] [phone] (office) [phone] (fax)  SURGEON:  Loman Brooklyn, MD     PREPROCEDURE DIAGNOSIS:  Syncope    POSTPROCEDURE DIAGNOSIS:  Syncope     PROCEDURES:   1. Implantable loop recorder implantation    INTRODUCTION:  Jawaan Adachi is a 60 y.o. male with a history of syncope who presents today for implantable loop implantation.  The patient has had syncope without a cause identified.   he has worn telemetry previously during which he did not have arrhythmias.  There is significant concern for possible arrhythmia as the cause for the syncope. The patient therefore presents today for implantable loop implantation.     DESCRIPTION  OF PROCEDURE:  Informed written consent was obtained, and the patient was brought to the electrophysiology lab in a fasting state.  The patient required no sedation for the procedure today.  Mapping over the patient's chest was performed by the EP lab staff to identify the area where electrograms were most prominent for ILR recording.  This area was found to be the left parasternal region over the 3rd-4th intercostal space. The patients left chest was therefore prepped and draped in the usual sterile fashion by the EP lab staff. The skin overlying the left parasternal region was infiltrated with lidocaine for local analgesia.  A 0.5-cm incision was made over the left parasternal region over the 3rd intercostal space.  A subcutaneous ILR pocket was fashioned using a combination of sharp and blunt dissection.  A Medtronic Reveal Brandon Madden model [place] [id number] Madden implantable loop recorder was then placed into the pocket  R waves were very prominent and measured 0.16mV.  Steri- Strips and a sterile dressing were then applied.  There were no early apparent complications.     CONCLUSIONS:   1.  Successful implantation of a Medtronic Reveal Brandon Madden implantable loop recorder for syncope  2. No early apparent complications.   Pharrah Rottman Jorja Loa, MD 07/06/2020 9:42 AM

## 2020-07-06 NOTE — Patient Instructions (Signed)
Medication Instructions:  Your physician recommends that you continue on your current medications as directed. Please refer to the Current Medication list given to you today.  *If you need a refill on your cardiac medications before your next appointment, please call your pharmacy*   Lab Work: None ordered   Testing/Procedures: None ordered   Follow-Up: At [facility], you and your health needs are our priority.  As part of our continuing mission to provide you with exceptional heart care, we have created designated Provider Care Teams.  These Care Teams include your primary Cardiologist (physician) and Advanced Practice Providers (APPs -  Physician Assistants and Nurse Practitioners) who all work together to provide you with the care you need, when you need it.  Remote monitoring is used to monitor your Pacemaker or ICD from home. This monitoring reduces the number of office visits required to check your device to one time per year. It allows [place] to keep an eye on the functioning of your device to ensure it is working properly. You are scheduled for a device check from home on 10/05/2020. You may send your transmission at any time that day. If you have a wireless device, the transmission will be sent automatically. After your physician reviews your transmission, you will receive a postcard with your next transmission date.  Your next appointment:    as needed  The format for your next appointment:   In Person  Provider:   Loman Brooklyn, MD   Thank you for choosing [facility]!!   Dory Horn, RN [phone]    Other Instructions   Implantable Loop Recorder Placement  An implantable loop recorder is a small electronic device that is placed under the skin of your chest. The device records the electrical activity of your heart over a long period of time. Your health care provider can download these recordings to monitor your heart. You may need an implantable loop  recorder if you have periods of abnormal heart activity (arrhythmias) or unexplained fainting (syncope). The recorder can be left in place for 1 year or longer. Tell a health care provider about:  Any allergies you have.  All medicines you are taking, including vitamins, herbs, eye drops, creams, and over-the-counter medicines.  Any problems you or family members have had with anesthetic medicines.  Any blood disorders you have.  Any surgeries you have had.  Any medical conditions you have.  Whether you are pregnant or may be pregnant. What are the risks? Generally, this is a safe procedure. However, problems may occur, including:  Infection.  Bleeding.  Allergic reactions to anesthetic medicines.  Damage to nerves or blood vessels.  Failure of the device to work. This could require another surgery to replace it. What happens before the procedure?  You may have a physical exam, blood tests, and imaging tests of your heart, such as a chest X-ray.  Follow instructions from your health care provider about eating or drinking restrictions.  Ask your health care provider about: ? Changing or stopping your regular medicines. This is especially important if you are taking diabetes medicines or blood thinners. ? Taking medicines such as aspirin and ibuprofen. These medicines can thin your blood. Do not take these medicines unless your health care provider tells you to take them. ? Taking over-the-counter medicines, vitamins, herbs, and supplements.  Ask your health care provider how your surgical site will be marked or identified.  Ask your health care provider what steps will be taken to help  prevent infection. These may include: ? Removing hair at the surgery site. ? Washing skin with a germ-killing soap.  Plan to have someone take you home from the hospital or clinic.  Plan to have a responsible adult care for you for at least 24 hours after you leave the hospital or clinic.  This is important.  Do not use any products that contain nicotine or tobacco, such as cigarettes and e-cigarettes. If you need help quitting, ask your health care provider.   What happens during the procedure?  An IV will be inserted into one of your veins.  You may be given one or more of the following: ? A medicine to help you relax (sedative). ? A medicine to numb the area (local anesthetic).  A small incision will be made on the left side of your upper chest.  A pocket will be created under your skin.  The device will be placed in the pocket.  The incision will be closed with stitches (sutures) or adhesive strips.  A bandage (dressing) will be placed over the incision. The procedure may vary among health care providers and hospitals. What happens after the procedure?  Your blood pressure, heart rate, breathing rate, and blood oxygen level will be monitored until you leave the hospital or clinic.  You may be able to go home on the day of your surgery. Before you go home: ? Your health care provider will program your recorder. ? You will learn how to trigger your device with a handheld activator. ? You will learn how to send recordings to your health care provider. ? You will get an ID card for your device, and you will be told when to use it. Summary  An implantable loop recorder is a small electronic device that is placed under the skin of your chest to monitor your heart over a long period of time.  The recorder can be left in place for 1 year or longer. This information is not intended to replace advice given to you by your health care provider. Make sure you discuss any questions you have with your health care provider. Document Revised: 10/28/2019 Document Reviewed: 03/24/2017 Elsevier Patient Education  2021 [employer].

## 2020-07-08 NOTE — Procedures (Signed)
   [facility]  EEG (ELECTROENCEPHALOGRAM) REPORT   STUDY DATE: 07/08/20 PATIENT NAME: Brandon Madden DOB: [birth year] MRN: [id number]  ORDERING CLINICIAN: Huston Foley, MD PhD   TECHNOLOGIST: Ardis Hughs  TECHNIQUE: Electroencephalogram was recorded utilizing standard 10-20 system of lead placement and reformatted into average and bipolar montages.  RECORDING TIME: 26 minutes  ACTIVATION: hyperventilation and photic stimulation  CLINICAL INFORMATION: 60 year old male with abnormal spells.  FINDINGS: Posterior dominant background rhythms, which attenuate with eye opening, ranging 9-10 hertz and 20-30 microvolts. No focal, lateralizing, epileptiform activity or seizures are seen. Patient recorded in the awake and drowsy state. EKG channel shows regular rhythm of 75-85 beats per minute.   IMPRESSION:   Normal EEG in the awake and drowsy states.    INTERPRETING PHYSICIAN:  Suanne Marker, MD Certified in Neurology, Neurophysiology and Neuroimaging  [facility] [address] [place], [place] [address] [phone]

## 2020-07-12 ENCOUNTER — Telehealth: Payer: Self-pay

## 2020-07-12 NOTE — Progress Notes (Signed)
Please call patient and advise him that his recent EEG was reported as normal in the awake and drowsy states.  As discussed, he can follow-up with his primary care physician at this point.  I saw that he recently had a visit with Dr. Elberta Fortis in cardiology as well.

## 2020-07-12 NOTE — Telephone Encounter (Signed)
-----   Message from Huston Foley, MD sent at 07/12/2020  8:48 AM EDT ----- Please call patient and advise him that his recent EEG was reported as normal in the awake and drowsy states.  As discussed, he can follow-up with his primary care physician at this point.  I saw that he recently had a visit with Dr. Elberta Fortis in cardiology as well.

## 2020-07-12 NOTE — Telephone Encounter (Signed)
I called the pt and we reviewed the results of the EEG. Pt verbalized understanding and will f/u with PCP and Cardiologist as scheduled/reccomended.

## 2020-07-26 ENCOUNTER — Telehealth: Payer: Self-pay

## 2020-07-26 NOTE — Telephone Encounter (Signed)
ILR alert received for One new AF episode that was 3 hours and 4 minutes, ? OAC. There were five symptom episode that all showed SR. Attempted to contact patient to assess. No answer, LMTCB.

## 2020-08-03 NOTE — Telephone Encounter (Signed)
3rd attempt to contact patient. No answer. LMTCB.    Certified letter sent.  

## 2020-08-09 ENCOUNTER — Ambulatory Visit (INDEPENDENT_AMBULATORY_CARE_PROVIDER_SITE_OTHER): Payer: BC Managed Care – PPO

## 2020-08-09 DIAGNOSIS — R55 Syncope and collapse: Secondary | ICD-10-CM | POA: Diagnosis not present

## 2020-08-10 ENCOUNTER — Other Ambulatory Visit: Payer: Self-pay

## 2020-08-10 ENCOUNTER — Other Ambulatory Visit: Payer: BC Managed Care – PPO | Admitting: *Deleted

## 2020-08-10 DIAGNOSIS — E785 Hyperlipidemia, unspecified: Secondary | ICD-10-CM

## 2020-08-10 LAB — CUP PACEART REMOTE DEVICE CHECK
Date Time Interrogation Session: 20220620182618
Implantable Pulse Generator Implant Date: 20220517

## 2020-08-10 LAB — LIPID PANEL
Chol/HDL Ratio: 4.7 ratio (ref 0.0–5.0)
Cholesterol, Total: 237 mg/dL — ABNORMAL HIGH (ref 100–199)
HDL: 50 mg/dL (ref >39)
LDL Chol Calc (NIH): 172 mg/dL — ABNORMAL HIGH (ref 0–99)
Triglycerides: 87 mg/dL (ref 0–149)
VLDL Cholesterol Cal: 15 mg/dL (ref 5–40)

## 2020-08-11 DIAGNOSIS — E785 Hyperlipidemia, unspecified: Secondary | ICD-10-CM

## 2020-08-11 DIAGNOSIS — I2510 Atherosclerotic heart disease of native coronary artery without angina pectoris: Secondary | ICD-10-CM

## 2020-08-11 DIAGNOSIS — Z79899 Other long term (current) drug therapy: Secondary | ICD-10-CM

## 2020-08-12 NOTE — Telephone Encounter (Signed)
Order for repeat lipids placed in the system and scheduled for the pt to have this done at our office in 6 weeks on 09/23/20.  He is aware to come fasting and to let us know if this date is of conflict, via [employer].

## 2020-08-26 NOTE — Progress Notes (Signed)
Carelink Summary Report / Loop Recorder 

## 2020-09-13 ENCOUNTER — Ambulatory Visit (INDEPENDENT_AMBULATORY_CARE_PROVIDER_SITE_OTHER): Payer: BC Managed Care – PPO

## 2020-09-13 DIAGNOSIS — R55 Syncope and collapse: Secondary | ICD-10-CM | POA: Diagnosis not present

## 2020-09-14 ENCOUNTER — Telehealth: Payer: Self-pay

## 2020-09-14 LAB — CUP PACEART REMOTE DEVICE CHECK
Date Time Interrogation Session: 20220723182242
Implantable Pulse Generator Implant Date: 20220517

## 2020-09-14 NOTE — Telephone Encounter (Addendum)
Carelink alert received for AT/AF episode 09/14/20, 1 hour in duration. No OAC. Successful telephone call to patient to assess for symptoms. Patient states he was sleeping during episode which occurred at 0123. Previous episode noted 07/23/20 for 3 hour duration however unable to reach patient to assess for symptoms. Discussed with Dr. Elberta Fortis. Watchful waiting for now. Patient updated.

## 2020-09-23 ENCOUNTER — Other Ambulatory Visit: Payer: BC Managed Care – PPO

## 2020-09-30 ENCOUNTER — Other Ambulatory Visit: Payer: BC Managed Care – PPO | Admitting: *Deleted

## 2020-09-30 ENCOUNTER — Other Ambulatory Visit: Payer: Self-pay

## 2020-09-30 DIAGNOSIS — I2510 Atherosclerotic heart disease of native coronary artery without angina pectoris: Secondary | ICD-10-CM | POA: Diagnosis not present

## 2020-09-30 DIAGNOSIS — E785 Hyperlipidemia, unspecified: Secondary | ICD-10-CM

## 2020-09-30 DIAGNOSIS — Z79899 Other long term (current) drug therapy: Secondary | ICD-10-CM

## 2020-09-30 LAB — LIPID PANEL
Chol/HDL Ratio: 3.8 ratio (ref 0.0–5.0)
Cholesterol, Total: 171 mg/dL (ref 100–199)
HDL: 45 mg/dL (ref >39)
LDL Chol Calc (NIH): 110 mg/dL — ABNORMAL HIGH (ref 0–99)
Triglycerides: 83 mg/dL (ref 0–149)
VLDL Cholesterol Cal: 16 mg/dL (ref 5–40)

## 2020-10-05 NOTE — Progress Notes (Signed)
Carelink Summary Report / Loop Recorder 

## 2020-10-15 ENCOUNTER — Encounter: Payer: Self-pay | Admitting: *Deleted

## 2020-10-18 ENCOUNTER — Ambulatory Visit (INDEPENDENT_AMBULATORY_CARE_PROVIDER_SITE_OTHER): Payer: BC Managed Care – PPO

## 2020-10-18 DIAGNOSIS — R55 Syncope and collapse: Secondary | ICD-10-CM | POA: Diagnosis not present

## 2020-10-18 LAB — CUP PACEART REMOTE DEVICE CHECK
Date Time Interrogation Session: 20220825182347
Implantable Pulse Generator Implant Date: 20220517

## 2020-10-28 NOTE — Progress Notes (Signed)
Carelink Summary Report / Loop Recorder 

## 2020-11-04 ENCOUNTER — Telehealth: Payer: Self-pay

## 2020-11-04 NOTE — Telephone Encounter (Signed)
"  ILR alert report received. Battery status OK. Normal device function. No new tachy, brady, or pause episodes. One symptom episode that was SR.  One new AF episode that was 3 hours and 14 minutes, AF burden is 0.5%, no OAC, previous reports state "watchful waiting".  Sent to triage due to AF.   Monthly summary reports and ROV/PRN"  Successful telephone encounter to assess for s/s of AF with high V rates that occurred 11/03/20 at 22:57. Patient states he was aware of palpitations but denies dizziness, shortness of breath, or chest pain/pressure. Confirmed not OAC. Compliant with ASA 81mg . Will forward to Dr. for review and recommendations. Burden increased from 0.3% to 0.5%.

## 2020-11-22 ENCOUNTER — Ambulatory Visit (INDEPENDENT_AMBULATORY_CARE_PROVIDER_SITE_OTHER): Payer: BC Managed Care – PPO

## 2020-11-22 DIAGNOSIS — R55 Syncope and collapse: Secondary | ICD-10-CM

## 2020-11-23 LAB — CUP PACEART REMOTE DEVICE CHECK
Date Time Interrogation Session: 20220927182059
Implantable Pulse Generator Implant Date: 20220517

## 2020-11-26 ENCOUNTER — Telehealth: Payer: Self-pay

## 2020-11-26 NOTE — Telephone Encounter (Signed)
Successful telephone encounter to patient to discuss s/s of AF. Patient states he is having palpitations daily and is more tired than usual. He is concerned and would like to discuss with Dr. Elberta Fortis. Informed patient that Dr. Elberta Fortis has been made aware of incidental findings on loop and RN is awaiting recommendations. Patient is appreciative of call and monitoring. He continues to take his 81 mg ASA.

## 2020-11-26 NOTE — Telephone Encounter (Signed)
MDT ILR alert 4 Sx events and 1 AF event (burden 17.4%). 1-sx event correlated to AF event 10/05 @ 22:00 lasting 6 hr with V response 110's bpm. 3 sx events SR with ectopy. Routing to triage for long AF event.  Transmission reviewed. This is the second remote that indicated AF new onset. ILR implanted 07/06/20 for syncope. Hip[employer] compliant VM message left requesting call back to [phone] to assess for symptoms. Will route to Dr. Elberta Fortis for review and recommendations.

## 2020-11-26 NOTE — Telephone Encounter (Signed)
Pt left message on voicemail for nurse to return his call. His phone number is [phone].

## 2020-11-26 NOTE — Telephone Encounter (Signed)
Telephone encounter to patient to discuss Dr. Elberta Fortis recommendation for office appointment or referral to AF clinic. Patient states he has lots of questions and does not feel AF clinic can adequately answer. Provided patient reassurance that AF clinic is a specialty clinic. Patient declines referral. He will be contacted by scheduler next week for appointment with Dr. Elberta Fortis for new onset AF.

## 2020-11-29 NOTE — Progress Notes (Signed)
Carelink Summary Report / Loop Recorder 

## 2020-12-13 ENCOUNTER — Encounter: Payer: Self-pay | Admitting: Cardiology

## 2020-12-13 ENCOUNTER — Ambulatory Visit: Payer: BC Managed Care – PPO | Admitting: Cardiology

## 2020-12-13 ENCOUNTER — Other Ambulatory Visit: Payer: Self-pay

## 2020-12-13 VITALS — BP 124/70 | HR 91 | Ht 66.0 in | Wt 201.8 lb

## 2020-12-13 DIAGNOSIS — I480 Paroxysmal atrial fibrillation: Secondary | ICD-10-CM | POA: Diagnosis not present

## 2020-12-13 DIAGNOSIS — R55 Syncope and collapse: Secondary | ICD-10-CM | POA: Diagnosis not present

## 2020-12-13 MED ORDER — DILTIAZEM HCL 30 MG PO TABS
30.0000 mg | ORAL_TABLET | Freq: Four times a day (QID) | ORAL | 3 refills | Status: DC | PRN
Start: 2020-12-13 — End: 2023-06-11

## 2020-12-13 NOTE — Progress Notes (Signed)
Electrophysiology Office Note   Date:  12/13/2020   ID:  Brandon Madden, DOB 1960-08-24, MRN [id number]  PCP:  [place], Betty G, MD  Cardiologist: Shari Prows Primary Electrophysiologist:  Johnathon Olden Jorja Loa, MD    Chief Complaint: Syncope   History of Present Illness: Brandon Madden is a 60 y.o. male who is being seen today for the evaluation of syncope at the request of [place], Timoteo Expose, MD. Presenting today for electrophysiology evaluation.  He has a history significant for hypertension and COVID-19 infection.  He also had an episode of syncope.  He was sitting in a restaurant, and suddenly, he was going to go into a hole.  He had dizziness and profound fatigue.  He lost consciousness for 20 to 25 seconds before returning back to baseline.  He had no chest pain or palpitations.  The day before, he completed an 11 mile bike ride.  He had several similar episodes but did not lost consciousness.  He was seen by neurology with a normal work-up.  Cardiac monitoring showed no significant arrhythmia.  He had a Linq monitor implanted that showed a 22-hour episode of atrial fibrillation.  Today, denies symptoms of palpitations, chest pain, shortness of breath, orthopnea, PND, lower extremity edema, claudication, dizziness, presyncope, syncope, bleeding, or neurologic sequela. The patient is tolerating medications without difficulties.  He had palpitations during his episode of atrial fibrillation.  He did go to sleep, and felt better in the morning.  He worked a full day the next day.  In between those times, he is doing well.  He does state that he is trying to exercise and eat healthy, but has also had quite a few stresses at work.   Past Medical History:  Diagnosis Date   Deafness in right ear    Family history of pancreatic cancer    Family history of prostate cancer    Family history of skin cancer    Sleep apnea    Past Surgical History:  Procedure Laterality Date   NASAL SEPTUM SURGERY        Current Outpatient Medications  Medication Sig Dispense Refill   aspirin EC 81 MG tablet Take 1 tablet (81 mg total) by mouth daily. Swallow whole. 90 tablet 3   diltiazem (CARDIZEM) 30 MG tablet Take 1 tablet (30 mg total) by mouth 4 (four) times daily as needed. 30 tablet 3   rosuvastatin (CRESTOR) 20 MG tablet Take 2 tablets (40 mg total) by mouth daily. 180 tablet 3   No current facility-administered medications for this visit.    Allergies:   Bee venom   Social History:  The patient  reports that he has never smoked. He has never used smokeless tobacco. He reports that he does not drink alcohol and does not use drugs.   Family History:  The patient's family history includes Heart attack in his father; Pancreatic cancer in his maternal grandmother; Prostate cancer in his brother and brother; Skin cancer in his sister.   ROS:  Please see the history of present illness.   Otherwise, review of systems is positive for none.   All other systems are reviewed and negative.   PHYSICAL EXAM: VS:  BP 124/70   Pulse 91   Ht 5\' 6"  (1.676 m)   Wt 201 lb 12.8 oz (91.5 kg)   SpO2 97%   BMI 32.57 kg/m  , BMI Body mass index is 32.57 kg/m. GEN: Well nourished, well developed, in no acute distress  HEENT: normal  Neck: no JVD, carotid bruits, or masses Cardiac: RRR; no murmurs, rubs, or gallops,no edema  Respiratory:  clear to auscultation bilaterally, normal work of breathing GI: soft, nontender, nondistended, + BS MS: no deformity or atrophy  Skin: warm and dry, device site well healed Neuro:  Strength and sensation are intact Psych: euthymic mood, full affect  EKG:  EKG is ordered today. Personal review of the ekg ordered shows sinus rhythm  Personal review of the device interrogation today. Results in [facility]   Recent Labs: 03/30/2020: ALT 52; BUN 19; Creatinine, Ser 1.02; Potassium 4.5; Sodium 137; TSH 1.64    Lipid Panel     Component Value Date/Time   CHOL 171  09/30/2020 0809   TRIG 83 09/30/2020 0809   HDL 45 09/30/2020 0809   CHOLHDL 3.8 09/30/2020 0809   CHOLHDL 5 03/30/2020 1011   VLDL 24.2 03/30/2020 1011   LDLCALC 110 (H) 09/30/2020 0809     Wt Readings from Last 3 Encounters:  12/13/20 201 lb 12.8 oz (91.5 kg)  07/06/20 203 lb (92.1 kg)  06/09/20 200 lb (90.7 kg)      Other studies Reviewed: Additional studies/ records that were reviewed today include: TTE 06/03/20  Review of the above records today demonstrates:   1. Left ventricular ejection fraction, by estimation, is 55 to 60%. The  left ventricle has normal function. The left ventricle has no regional  wall motion abnormalities. Left ventricular diastolic parameters are  consistent with Grade I diastolic  dysfunction (impaired relaxation).   2. Right ventricular systolic function is normal. The right ventricular  size is normal.   3. The mitral valve is normal in structure. Trivial mitral valve  regurgitation.   4. The aortic valve is normal in structure. There is mild thickening of  the aortic valve. Aortic valve regurgitation is trivial. No aortic  stenosis is present.   Cardiac monitor 04/29/2020 personally reviewed Patch wear time was 13 days and 9 hours Predominant rhythm was NSR with average HR 84 (ranged from 48-172bpm) Rare SVE, rare PVCs <1% No Afib, significant pauses or sustained arrhythmias Triggered events mainly correlated with PACs. Overall, normal cardiac monitor    ASSESSMENT AND PLAN:  1.  Syncope: Status post Linq monitor implant.  No further episodes of syncope.  We Brandon Madden continue to monitor.  2.  Paroxysmal atrial fibrillation: CHA2DS2-VASc of 1.  No indication for anticoagulation at this time.  We Brandon Madden provide him with diltiazem 30 mg to take on an as-needed basis for episodes of rapid heart rates.  If he does continue to have episodes of atrial fibrillation and feels poorly,  3.  Coronary artery calcification: Currently on Crestor 40 mg  daily.  Plan per primary cardiology.  Current medicines are reviewed at length with the patient today.   The patient does not have concerns regarding his medicines.  The following changes were made today: Start diltiazem as above  Labs/ tests ordered today include:  Orders Placed This Encounter  Procedures   EKG 12-Lead      Disposition:   FU with Brandon Madden 6 months  Signed, Brandon Madden Jorja Loa, MD  12/13/2020 3:19 PM     [facility] [address] [place] [address] [phone] (office) [phone] (fax)

## 2020-12-13 NOTE — Patient Instructions (Addendum)
Medication Instructions:  Your physician has recommended you make the following change in your medication:  TAKE Diltiazem 30 mg every 6 hours as needed for elevated heart rates  *If you need a refill on your cardiac medications before your next appointment, please call your pharmacy*   Lab Work: None ordered   Testing/Procedures: None ordered   Follow-Up: At [facility], you and your health needs are our priority.  As part of our continuing mission to provide you with exceptional heart care, we have created designated Provider Care Teams.  These Care Teams include your primary Cardiologist (physician) and Advanced Practice Providers (APPs -  Physician Assistants and Nurse Practitioners) who all work together to provide you with the care you need, when you need it.  Remote monitoring is used to monitor your Pacemaker or ICD from home. This monitoring reduces the number of office visits required to check your device to one time per year. It allows [place] to keep an eye on the functioning of your device to ensure it is working properly. You are scheduled for a device check from home on 12/27/2020. You may send your transmission at any time that day. If you have a wireless device, the transmission will be sent automatically. After your physician reviews your transmission, you will receive a postcard with your next transmission date.  Your next appointment:   6 month(s)  The format for your next appointment:   In Person  Provider:   Francis Dowse, PA or Otilio Saber, [place]   Thank you for choosing [facility]!!   Dory Horn, RN [phone]    Other Instructions

## 2020-12-21 LAB — CUP PACEART REMOTE DEVICE CHECK
Date Time Interrogation Session: 20221030182433
Implantable Pulse Generator Implant Date: 20220517

## 2020-12-27 ENCOUNTER — Ambulatory Visit (INDEPENDENT_AMBULATORY_CARE_PROVIDER_SITE_OTHER): Payer: BC Managed Care – PPO

## 2020-12-27 DIAGNOSIS — R55 Syncope and collapse: Secondary | ICD-10-CM

## 2020-12-30 NOTE — Progress Notes (Signed)
Carelink Summary Report / Loop Recorder 

## 2021-01-09 ENCOUNTER — Other Ambulatory Visit: Payer: Self-pay | Admitting: Cardiology

## 2021-01-11 MED ORDER — ROSUVASTATIN CALCIUM 20 MG PO TABS: 40.0000 mg | ORAL_TABLET | Freq: Every day | ORAL | 1 refills | Status: DC

## 2021-01-12 ENCOUNTER — Telehealth: Payer: Self-pay | Admitting: Cardiology

## 2021-01-12 NOTE — Telephone Encounter (Signed)
Called pt's pharmacy and the pharmacist stated that they do have the Rx that was sent in on 01/11/21 and they were getting it ready for pt to pick up. I also called the pt and left a VM stating that the pharmacy is getting his medication ready for him to pick up and if he has any other problems, questions or concerns, to give our office a call back.

## 2021-01-12 NOTE — Telephone Encounter (Signed)
*  STAT* If patient is at the pharmacy, call can be transferred to refill team.   1. Which medications need to be refilled? (please list name of each medication and dose if known) Rosuvastatin  2. Which pharmacy/location (including street and city if local pharmacy) is medication to be sent to? [facility] [place]  3. Do they need a 30 day or 90 day supply? 90 days and refills - Patient is completely out

## 2021-01-26 LAB — CUP PACEART REMOTE DEVICE CHECK
Date Time Interrogation Session: 20221202182313
Implantable Pulse Generator Implant Date: 20220517

## 2021-01-31 ENCOUNTER — Ambulatory Visit (INDEPENDENT_AMBULATORY_CARE_PROVIDER_SITE_OTHER): Payer: BC Managed Care – PPO

## 2021-01-31 DIAGNOSIS — R55 Syncope and collapse: Secondary | ICD-10-CM | POA: Diagnosis not present

## 2021-02-07 NOTE — Progress Notes (Signed)
Carelink Summary Report / Loop Recorder 

## 2021-03-07 ENCOUNTER — Ambulatory Visit (INDEPENDENT_AMBULATORY_CARE_PROVIDER_SITE_OTHER): Payer: BC Managed Care – PPO

## 2021-03-07 DIAGNOSIS — R55 Syncope and collapse: Secondary | ICD-10-CM

## 2021-03-07 LAB — CUP PACEART REMOTE DEVICE CHECK
Date Time Interrogation Session: 20230115230532
Implantable Pulse Generator Implant Date: 20220517

## 2021-03-16 NOTE — Progress Notes (Signed)
Carelink Summary Report / Loop Recorder 

## 2021-04-11 ENCOUNTER — Ambulatory Visit (INDEPENDENT_AMBULATORY_CARE_PROVIDER_SITE_OTHER): Payer: BC Managed Care – PPO

## 2021-04-11 DIAGNOSIS — R55 Syncope and collapse: Secondary | ICD-10-CM

## 2021-04-11 LAB — CUP PACEART REMOTE DEVICE CHECK
Date Time Interrogation Session: 20230219230821
Implantable Pulse Generator Implant Date: 20220517

## 2021-04-14 NOTE — Progress Notes (Signed)
Carelink Summary Report / Loop Recorder 

## 2021-05-04 NOTE — Progress Notes (Signed)
HPI: Mr. Ethyn Wenderoth is a 61 y.o.male here today for his routine physical examination and follow up.  Last CPE: 03/30/20. No new problems since his last visit.  Sleeping from 5-6 hours,wakes up earlier.  Regular exercise: Not consistently. Following a healthful diet: Good in general, home made meals. Some vegetables. He drinks "green juices" to get more greens and kombucha .Frustrated about wt gain. She follows with wt loss clinic, [facility].He is on testosterone replacement, he is taking it 2 times per week. He has hormones check regularly, states that he has had elevated estrogens and taking OTC supplement to help. He has an appt in a few days for follow up.  Chronic medical problems: Vit D def, HLD,paroxysmal atrial fib,hearing loss, and OSA among some.  Immunization History  Administered Date(s) Administered   Influenza,inj,Quad PF,6+ Mos 12/15/2018   PFIZER(Purple Top)SARS-COV-2 Vaccination 05/27/2019, 06/24/2019   Tdap 12/18/2008, 03/30/2020   Zoster Recombinat (Shingrix) 03/30/2020, 05/25/2020    Health Maintenance  Topic Date Due   INFLUENZA VACCINE  05/20/2021 (Originally 09/20/2020)   COVID-19 Vaccine (3 - Pfizer risk series) 05/20/2021 (Originally 07/22/2019)   HIV Screening  05/05/2022 (Originally 10/08/1975)   COLONOSCOPY (Pts 45-67yrs Insurance coverage will need to be confirmed)  02/20/2022   TETANUS/TDAP  03/30/2030   Hepatitis C Screening  Completed   Zoster Vaccines- Shingrix  Completed   HPV VACCINES  Aged Out   Last prostate ca screening: 0.90 03/30/20 Nocturia x 1. Negative for dysuria or gross hematuria.  -Negative for high alcohol intake or tobacco use.  -Concerns and/or follow up today:  He had some labs done through employer on 04/15/21. Paroxysmal atrial fib: He is on Diltiazem 30 mg bid prn, he takes medication about 2 times per months. He has been evaluated for syncope, 05/2020, has seen cardiologist and neurologist.  +Palpitations, skipping bets, waking  him up sometimes but no more than usual. He has an implanted heart monitor. Follows with cardiologist annually.  Vit D supplementation,taking 2 different multivitamin supplements, trying to help with energy. Fatigue, if he sleeps more hours he feels rested.  OSA: He needs a new CPAP, has had the same for 15 years, when he had a sleep study.  HLD: He is on Crestor 20 mg daily. Tolerating medication well. 04/15/2021 TC 173, HDL 49, LDL 106, and TG 98. LFT's mildly abnormal, ALT 46 and GGT 83, rest in normal range.  TSH and free T4 normal, T3 uptake slightly low at 23 (normal 24). CBC: Elevated H/H at 18.0/51.8, rest in normal range.  Glucose 116.No hx of diabetes. Negative for polydipsia,polyuria, or polyphagia.  Uric acid 5.6  Review of Systems  Constitutional:  Positive for fatigue. Negative for activity change, appetite change and fever.  HENT:  Negative for mouth sores, nosebleeds, sore throat, trouble swallowing and voice change.   Eyes:  Negative for redness and visual disturbance.  Respiratory:  Negative for cough, shortness of breath and wheezing.   Cardiovascular:  Positive for palpitations. Negative for chest pain and leg swelling.  Gastrointestinal:  Negative for abdominal pain, blood in stool, nausea and vomiting.  Endocrine: Negative for cold intolerance and heat intolerance.  Genitourinary:  Negative for decreased urine volume, dysuria, genital sores, hematuria and testicular pain.  Musculoskeletal:  Negative for gait problem and myalgias.  Skin:  Negative for color change and rash.  Allergic/Immunologic: Positive for environmental allergies.  Neurological:  Negative for syncope, weakness and headaches.  Hematological:  Negative for adenopathy. Does not bruise/bleed easily.  Psychiatric/Behavioral:  Positive for sleep disturbance. Negative for confusion. The patient is nervous/anxious.    Current Outpatient Medications on File Prior to Visit  Medication Sig Dispense  Refill   aspirin EC 81 MG tablet Take 1 tablet (81 mg total) by mouth daily. Swallow whole. 90 tablet 3   diltiazem (CARDIZEM) 30 MG tablet Take 1 tablet (30 mg total) by mouth 4 (four) times daily as needed. 30 tablet 3   rosuvastatin (CRESTOR) 20 MG tablet Take 2 tablets (40 mg total) by mouth daily. 180 tablet 1   No current facility-administered medications on file prior to visit.   Past Medical History:  Diagnosis Date   Deafness in right ear    Family history of pancreatic cancer    Family history of prostate cancer    Family history of skin cancer    Sleep apnea    Past Surgical History:  Procedure Laterality Date   NASAL SEPTUM SURGERY     Allergies  Allergen Reactions   Bee Venom     Family History  Problem Relation Age of Onset   Heart attack Father    Pancreatic cancer Maternal Grandmother        dx 76s, d 69s   Skin cancer Sister    Prostate cancer Brother        dx 7   Prostate cancer Brother        dx 71, metastatic, d 24    Social History   Socioeconomic History   Marital status: Married    Spouse name: [name]   Number of children: 4   Years of education: Law   Highest education level: Not on file  Occupational History   Occupation: [employer]    Comment: [employer]  Tobacco Use   Smoking status: Never   Smokeless tobacco: Never  Substance and Sexual Activity   Alcohol use: No    Comment: quit: 2011   Drug use: No   Sexual activity: Not on file  Other Topics Concern   Not on file  Social History Narrative   Right handed   Patient lives at home with his family.   Caffeine Use: 2 cups daily   Social Determinants of Health   Financial Resource Strain: Not on file  Food Insecurity: Not on file  Transportation Needs: Not on file  Physical Activity: Not on file  Stress: Not on file  Social Connections: Not on file   Vitals:   05/06/21 0805  BP: 110/70  Pulse: 96  Resp: 16  Temp: 98.4 F (36.9 C)  SpO2: 98%   Body mass index is 33.27  kg/m.  Wt Readings from Last 3 Encounters:  05/06/21 206 lb 2 oz (93.5 kg)  12/13/20 201 lb 12.8 oz (91.5 kg)  07/06/20 203 lb (92.1 kg)   Physical Exam Vitals and nursing note reviewed.  Constitutional:      General: He is not in acute distress.    Appearance: He is well-developed.  HENT:     Head: Normocephalic and atraumatic.     Right Ear: Tympanic membrane, ear canal and external ear normal.     Left Ear: Tympanic membrane, ear canal and external ear normal.     Mouth/Throat:     Mouth: Mucous membranes are moist.     Pharynx: Oropharynx is clear.  Eyes:     Conjunctiva/sclera: Conjunctivae normal.     Pupils: Pupils are equal, round, and reactive to light.  Neck:     Thyroid: No  thyromegaly.     Trachea: No tracheal deviation.  Cardiovascular:     Rate and Rhythm: Normal rate and regular rhythm.     Pulses:          Dorsalis pedis pulses are 2+ on the right side and 2+ on the left side.     Heart sounds: No murmur heard. Pulmonary:     Effort: Pulmonary effort is normal. No respiratory distress.     Breath sounds: Normal breath sounds.  Abdominal:     Palpations: Abdomen is soft. There is no hepatomegaly or mass.     Tenderness: There is no abdominal tenderness.  Genitourinary:    Comments: No concerns. Musculoskeletal:        General: No tenderness.     Cervical back: Normal range of motion.     Comments: No major deformities appreciated and no signs of synovitis.  Lymphadenopathy:     Cervical: No cervical adenopathy.     Upper Body:     Right upper body: No supraclavicular adenopathy.     Left upper body: No supraclavicular adenopathy.  Skin:    General: Skin is warm.     Findings: No erythema.  Neurological:     Mental Status: He is alert and oriented to person, place, and time.     Cranial Nerves: No cranial nerve deficit.     Sensory: No sensory deficit.     Coordination: Coordination normal.     Gait: Gait normal.     Deep Tendon Reflexes:      Reflex Scores:      Bicep reflexes are 2+ on the right side and 2+ on the left side.      Patellar reflexes are 2+ on the right side and 2+ on the left side. Psychiatric:        Mood and Affect: Mood is anxious.   ASSESSMENT AND PLAN:  Mr.Corrigan was seen today for annual exam and follow-up.  Diagnoses and all orders for this visit: Orders Placed This Encounter  Procedures   US Abdomen Limited RUQ (LIVER/GB)   VITAMIN D 25 Hydroxy (Vit-D Deficiency, Fractures)   Hemoglobin A1c   PSA(Must document that pt has been informed of limitations of PSA testing.)   Ambulatory referral to Pulmonology   Lab Results  Component Value Date   HGBA1C 5.9 05/06/2021   Lab Results  Component Value Date   PSA 0.73 05/06/2021        Routine general medical examination at a health care facility We discussed the importance of regular physical activity and healthy diet for prevention of chronic illness and/or complications. Preventive guidelines reviewed. Vaccination up to date. Next CPE in a year.  Hyperlipidemia, unspecified hyperlipidemia type Otherwise stable. Continue Crestor 20 mg daily and low fat diet.  Prostate cancer screening -     PSA(Must document that pt has been informed of limitations of PSA testing.)  Screening for endocrine, metabolic and immunity disorder -     Hemoglobin A1c  Abnormal LFTs We discussed possible etiologies, ? Fatty liver. Continue low fat diet. Wt loss will help. No changes in statin dose for now. Limit alcohol intake. RUQ [place] will be arranged.  Vitamin D deficiency, unspecified Recommend continuing same dose of vit D for now.  Further recommendations according to 25 OH vit D results.  OSA on CPAP Last sleep study 15 years ago. Appt with sleep specialist will be arranged.  PAF (paroxysmal atrial fibrillation) (HCC) Stable. He is not on chronic anticoagulation,  CHAD(2)DS-VASc score 0. Following with cardiologist.  Obesity (BMI 30.0-34.9) He  understands the benefits of wt loss as well as adverse effects of obesity. Consistency with healthy diet and physical activity encouraged.  Following with wt loss clinic, treated with testosterone replacement, we discussed some side effects. Weight Watchers is a good option as well as daily brisk walking for 15-30 min as tolerated.  Return in 1 year (on 05/07/2022) for cpe and f/u.  Anuj Summons G. [place], MD  [facility] [place] office.

## 2021-05-06 ENCOUNTER — Encounter: Payer: Self-pay | Admitting: Family Medicine

## 2021-05-06 ENCOUNTER — Ambulatory Visit (INDEPENDENT_AMBULATORY_CARE_PROVIDER_SITE_OTHER): Payer: BC Managed Care – PPO | Admitting: Family Medicine

## 2021-05-06 VITALS — BP 110/70 | HR 96 | Temp 98.4°F | Resp 16 | Ht 66.0 in | Wt 206.1 lb

## 2021-05-06 DIAGNOSIS — Z9989 Dependence on other enabling machines and devices: Secondary | ICD-10-CM

## 2021-05-06 DIAGNOSIS — Z0000 Encounter for general adult medical examination without abnormal findings: Secondary | ICD-10-CM

## 2021-05-06 DIAGNOSIS — Z13228 Encounter for screening for other metabolic disorders: Secondary | ICD-10-CM

## 2021-05-06 DIAGNOSIS — G4733 Obstructive sleep apnea (adult) (pediatric): Secondary | ICD-10-CM | POA: Diagnosis not present

## 2021-05-06 DIAGNOSIS — E785 Hyperlipidemia, unspecified: Secondary | ICD-10-CM

## 2021-05-06 DIAGNOSIS — E669 Obesity, unspecified: Secondary | ICD-10-CM

## 2021-05-06 DIAGNOSIS — Z130 Encounter for screening for diseases of the blood and blood-forming organs and certain disorders involving the immune mechanism: Secondary | ICD-10-CM

## 2021-05-06 DIAGNOSIS — I480 Paroxysmal atrial fibrillation: Secondary | ICD-10-CM

## 2021-05-06 DIAGNOSIS — Z125 Encounter for screening for malignant neoplasm of prostate: Secondary | ICD-10-CM | POA: Diagnosis not present

## 2021-05-06 DIAGNOSIS — Z1329 Encounter for screening for other suspected endocrine disorder: Secondary | ICD-10-CM

## 2021-05-06 DIAGNOSIS — E66811 Obesity, class 1: Secondary | ICD-10-CM

## 2021-05-06 DIAGNOSIS — E559 Vitamin D deficiency, unspecified: Secondary | ICD-10-CM

## 2021-05-06 DIAGNOSIS — R7989 Other specified abnormal findings of blood chemistry: Secondary | ICD-10-CM | POA: Diagnosis not present

## 2021-05-06 HISTORY — DX: Paroxysmal atrial fibrillation: I48.0

## 2021-05-06 LAB — PSA: PSA: 0.73 ng/mL (ref 0.10–4.00)

## 2021-05-06 LAB — VITAMIN D 25 HYDROXY (VIT D DEFICIENCY, FRACTURES): VITD: 26.15 ng/mL — ABNORMAL LOW (ref 30.00–100.00)

## 2021-05-06 LAB — HEMOGLOBIN A1C: Hgb A1c MFr Bld: 5.9 % (ref 4.6–6.5)

## 2021-05-06 NOTE — Patient Instructions (Addendum)
A few things to remember from today's visit: ? ?Routine general medical examination at a health care facility ? ?Hyperlipidemia, unspecified hyperlipidemia type ? ?Prostate cancer screening - Plan: PSA(Must document that pt has been informed of limitations of PSA testing.) ? ?Screening for endocrine, metabolic and immunity disorder - Plan: Hemoglobin A1c ? ?Abnormal LFTs - Plan: US Abdomen Limited RUQ (LIVER/GB) ? ?Vitamin D deficiency, unspecified - Plan: VITAMIN D 25 Hydroxy (Vit-D Deficiency, Fractures) ? ?OSA on CPAP - Plan: Ambulatory referral to Pulmonology ? ?Do not use My Chart to request refills or for acute issues that need immediate attention. ?Please be sure medication list is accurate. ?If a new problem present, please set up appointment sooner than planned today. ? ?Health Maintenance, Male ?Adopting a healthy lifestyle and getting preventive care are important in promoting health and wellness. Ask your health care provider about: ?The right schedule for you to have regular tests and exams. ?Things you can do on your own to prevent diseases and keep yourself healthy. ?What should I know about diet, weight, and exercise? ?Eat a healthy diet ? ?Eat a diet that includes plenty of vegetables, fruits, low-fat dairy products, and lean protein. ?Do not eat a lot of foods that are high in solid fats, added sugars, or sodium. ?Maintain a healthy weight ?Body mass index (BMI) is a measurement that can be used to identify possible weight problems. It estimates body fat based on height and weight. Your health care provider can help determine your BMI and help you achieve or maintain a healthy weight. ?Get regular exercise ?Get regular exercise. This is one of the most important things you can do for your health. Most adults should: ?Exercise for at least 150 minutes each week. The exercise should increase your heart rate and make you sweat (moderate-intensity exercise). ?Do strengthening exercises at least twice a  week. This is in addition to the moderate-intensity exercise. ?Spend less time sitting. Even light physical activity can be beneficial. ?Watch cholesterol and blood lipids ?Have your blood tested for lipids and cholesterol at 60 years of age, then have this test every 5 years. ?You may need to have your cholesterol levels checked more often if: ?Your lipid or cholesterol levels are high. ?You are older than 60 years of age. ?You are at high risk for heart disease. ?What should I know about cancer screening? ?Many types of cancers can be detected early and may often be prevented. Depending on your health history and family history, you may need to have cancer screening at various ages. This may include screening for: ?Colorectal cancer. ?Prostate cancer. ?Skin cancer. ?Lung cancer. ?What should I know about heart disease, diabetes, and high blood pressure? ?Blood pressure and heart disease ?High blood pressure causes heart disease and increases the risk of stroke. This is more likely to develop in people who have high blood pressure readings or are overweight. ?Talk with your health care provider about your target blood pressure readings. ?Have your blood pressure checked: ?Every 3-5 years if you are 83-70 years of age. ?Every year if you are 56 years old or older. ?If you are between the ages of 18 and 91 and are a current or former smoker, ask your health care provider if you should have a one-time screening for abdominal aortic aneurysm (AAA). ?Diabetes ?Have regular diabetes screenings. This checks your fasting blood sugar level. Have the screening done: ?Once every three years after age 52 if you are at a normal weight and have a  low risk for diabetes. ?More often and at a younger age if you are overweight or have a high risk for diabetes. ?What should I know about preventing infection? ?Hepatitis B ?If you have a higher risk for hepatitis B, you should be screened for this virus. Talk with your health care  provider to find out if you are at risk for hepatitis B infection. ?Hepatitis C ?Blood testing is recommended for: ?Everyone born from [birth year] through 1964. ?Anyone with known risk factors for hepatitis C. ?Sexually transmitted infections (STIs) ?You should be screened each year for STIs, including gonorrhea and chlamydia, if: ?You are sexually active and are younger than 60 years of age. ?You are older than 60 years of age and your health care provider tells you that you are at risk for this type of infection. ?Your sexual activity has changed since you were last screened, and you are at increased risk for chlamydia or gonorrhea. Ask your health care provider if you are at risk. ?Ask your health care provider about whether you are at high risk for HIV. Your health care provider may recommend a prescription medicine to help prevent HIV infection. If you choose to take medicine to prevent HIV, you should first get tested for HIV. You should then be tested every 3 months for as long as you are taking the medicine. ?Follow these instructions at home: ?Alcohol use ?Do not drink alcohol if your health care provider tells you not to drink. ?If you drink alcohol: ?Limit how much you have to 0-2 drinks a day. ?Know how much alcohol is in your drink. In the [place] one drink equals one 12 oz bottle of beer (355 mL), one 5 oz glass of wine (148 mL), or one 1? oz glass of hard liquor (44 mL). ?Lifestyle ?Do not use any products that contain nicotine or tobacco. These products include cigarettes, chewing tobacco, and vaping devices, such as e-cigarettes. If you need help quitting, ask your health care provider. ?Do not use street drugs. ?Do not share needles. ?Ask your health care provider for help if you need support or information about quitting drugs. ?General instructions ?Schedule regular health, dental, and eye exams. ?Stay current with your vaccines. ?Tell your health care provider if: ?You often feel depressed. ?You have  ever been abused or do not feel safe at home. ?Summary ?Adopting a healthy lifestyle and getting preventive care are important in promoting health and wellness. ?Follow your health care provider's instructions about healthy diet, exercising, and getting tested or screened for diseases. ?Follow your health care provider's instructions on monitoring your cholesterol and blood pressure. ?This information is not intended to replace advice given to you by your health care provider. Make sure you discuss any questions you have with your health care provider. ?Document Revised: 06/28/2020 Document Reviewed: 06/28/2020 ?Elsevier Patient Education ? 2022 Elsevier Inc[employer] ? ?

## 2021-05-16 ENCOUNTER — Ambulatory Visit
Admission: RE | Admit: 2021-05-16 | Discharge: 2021-05-16 | Disposition: A | Discharge status: Home / Self Care | Payer: BC Managed Care – PPO | Source: Ambulatory Visit | Attending: Family Medicine | Admitting: Family Medicine

## 2021-05-16 ENCOUNTER — Other Ambulatory Visit: Payer: Self-pay

## 2021-05-16 ENCOUNTER — Ambulatory Visit (INDEPENDENT_AMBULATORY_CARE_PROVIDER_SITE_OTHER): Payer: BC Managed Care – PPO

## 2021-05-16 DIAGNOSIS — R7989 Other specified abnormal findings of blood chemistry: Secondary | ICD-10-CM

## 2021-05-16 DIAGNOSIS — R55 Syncope and collapse: Secondary | ICD-10-CM

## 2021-05-17 LAB — CUP PACEART REMOTE DEVICE CHECK
Date Time Interrogation Session: 20230324230738
Implantable Pulse Generator Implant Date: 20220517

## 2021-05-20 ENCOUNTER — Other Ambulatory Visit: Payer: Self-pay | Admitting: Family Medicine

## 2021-05-20 ENCOUNTER — Encounter: Payer: Self-pay | Admitting: Family Medicine

## 2021-05-20 DIAGNOSIS — K760 Fatty (change of) liver, not elsewhere classified: Secondary | ICD-10-CM

## 2021-05-20 DIAGNOSIS — R7989 Other specified abnormal findings of blood chemistry: Secondary | ICD-10-CM

## 2021-05-25 NOTE — Progress Notes (Signed)
Carelink Summary Report / Loop Recorder 

## 2021-06-16 ENCOUNTER — Ambulatory Visit (INDEPENDENT_AMBULATORY_CARE_PROVIDER_SITE_OTHER): Payer: BC Managed Care – PPO

## 2021-06-16 DIAGNOSIS — R55 Syncope and collapse: Secondary | ICD-10-CM | POA: Diagnosis not present

## 2021-06-16 LAB — CUP PACEART REMOTE DEVICE CHECK
Date Time Interrogation Session: 20230426231425
Implantable Pulse Generator Implant Date: 20220517

## 2021-06-18 NOTE — Progress Notes (Deleted)
?Cardiology Office Note:   ? ?Date:  06/18/2021  ? ?ID:  Brandon Madden, DOB 05-Jun-1960, MRN [id number] ? ?PCP:  [place], Betty G, MD ?  ?[facility]  ?Cardiologist:  None  ?Advanced Practice Provider:  No care team member to display ?Electrophysiologist:  Will Meredith Leeds, MD  ? ?Referring MD: Martinique, Betty G, MD  ? ? ?History of Present Illness:   ? ?Brandon Madden is a 61 y.o. male with a hx of congenital deafness, HLD and recent COVID infection who presents to clinic for follow-up.  ? ?Patient seen on 04/05/18 for non-exertional chest pain and palpitations. 2 week zio monitor without significant arrhythmias. Coronary calcium score 166 which was 80% for age and sex matched control.  ? ?Was seen by Dr. Curt Bears on 11/2020 following an episode of syncope. Had LOC for 20-25seconds. Underwent linq implant which showed 22H episode of Afib. CHADs-vasc 1. No indication for [facility]. Was started on dilt 30mg  to start as needed. ? ?Today, *** ? ?Past Medical History:  ?Diagnosis Date  ? Deafness in right ear   ? Family history of pancreatic cancer   ? Family history of prostate cancer   ? Family history of skin cancer   ? Sleep apnea   ? ? ?Past Surgical History:  ?Procedure Laterality Date  ? NASAL SEPTUM SURGERY    ? ? ?Current Medications: ?No outpatient medications have been marked as taking for the 06/22/21 encounter (Appointment) with Freada Bergeron, MD.  ?  ? ?Allergies:   Bee venom  ? ?Social History  ? ?Socioeconomic History  ? Marital status: Married  ?  Spouse name: Verdis Frederickson  ? Number of children: 4  ? Years of education: Law  ? Highest education level: Not on file  ?Occupational History  ? Occupation: [employer]  ?  Comment: [employer]  ?Tobacco Use  ? Smoking status: Never  ? Smokeless tobacco: Never  ?Substance and Sexual Activity  ? Alcohol use: No  ?  Comment: quit: 2011  ? Drug use: No  ? Sexual activity: Not on file  ?Other Topics Concern  ? Not on file  ?Social History Narrative  ? Right handed   ? Patient lives at home with his family.  ? Caffeine Use: 2 cups daily  ? ?Social Determinants of Health  ? ?Financial Resource Strain: Not on file  ?Food Insecurity: Not on file  ?Transportation Needs: Not on file  ?Physical Activity: Not on file  ?Stress: Not on file  ?Social Connections: Not on file  ?  ? ?Family History: ?The patient's family history includes Heart attack in his father; Pancreatic cancer in his maternal grandmother; Prostate cancer in his brother and brother; Skin cancer in his sister. ? ?ROS:   ?Please see the history of present illness.    ?Review of Systems  ?Constitutional:  Negative for chills and fever.  ?HENT:  Negative for hearing loss and sore throat.   ?Eyes:  Negative for blurred vision and redness.  ?Respiratory:  Negative for shortness of breath.   ?Cardiovascular:  Negative for chest pain, palpitations, orthopnea, claudication, leg swelling and PND.  ?Gastrointestinal:  Negative for nausea and vomiting.  ?Genitourinary:  Negative for dysuria and flank pain.  ?Musculoskeletal:  Negative for falls.  ?Neurological:  Negative for dizziness and loss of consciousness.  ?Endo/Heme/Allergies:  Negative for polydipsia.  ?Psychiatric/Behavioral:  Negative for substance abuse.   ? ?EKGs/Labs/Other Studies Reviewed:   ? ?The following studies were reviewed today: ?Cardiac monitor  04/27/20: ?Patch wear time was 13 days and 9 hours ?Predominant rhythm was NSR with average HR 84 (ranged from 48-172bpm) ?Rare SVE, rare PVCs <1% ?No Afib, significant pauses or sustained arrhythmias ?Triggered events mainly correlated with PACs. ?Overall, normal cardiac monitor ? ?Cardiac CT scan 04/30/20: ?FINDINGS: ?Non-cardiac: See separate report from [facility]. ?  ?Ascending Aorta: Normal caliber. No calcifications. ?  ?Pericardium: Normal ?  ?Coronary arteries: Normal coronary origins. ?  ?IMPRESSION: ?Coronary calcium score of 166. This was 80th percentile for age and ?sex matched  control. ? ? ?Recent Labs: ?No results found for requested labs within last 8760 hours.  ?Recent Lipid Panel ?   ?Component Value Date/Time  ? CHOL 171 09/30/2020 0809  ? TRIG 83 09/30/2020 0809  ? HDL 45 09/30/2020 0809  ? CHOLHDL 3.8 09/30/2020 0809  ? CHOLHDL 5 03/30/2020 1011  ? VLDL 24.2 03/30/2020 1011  ? [place] 110 (H) 09/30/2020 0809  ? ? ? ?Physical Exam:   ? ?VS:  There were no vitals taken for this visit.   ? ?Wt Readings from Last 3 Encounters:  ?05/06/21 206 lb 2 oz (93.5 kg)  ?12/13/20 201 lb 12.8 oz (91.5 kg)  ?07/06/20 203 lb (92.1 kg)  ?  ? ?GEN:  Well nourished, well developed in no acute distress ?HEENT: Normal ?NECK: No JVD; No carotid bruits ?CARDIAC: RRR, no murmurs, rubs, gallops ?RESPIRATORY:  Clear to auscultation without rales, wheezing or rhonchi  ?ABDOMEN: Soft, non-tender, non-distended ?MUSCULOSKELETAL:  No edema; No deformity  ?SKIN: Warm and dry ?NEUROLOGIC:  Alert and oriented x 3 ?PSYCHIATRIC:  Normal affect  ? ?ASSESSMENT:   ? ?No diagnosis found. ? ?PLAN:   ? ?In order of problems listed above: ? ?#Afib: ?Diagnosed on Linq recorder with 22 hour episode. CHADs-vasc 1 with no indication for [facility] at this time. Followed by Dr. Curt Bears ?-Continue dilt 30mg  as needed ?-Not on [facility] due to CHADs-vasc 1 ?  ?#Chest Tightness: ?Improved. Does not sound cardiac related as symptoms occur at rest and never with exertion. Able to exercise 1 hour without issues. Likely related to stress. Calcium score 166. ?-Continue to monitor ?-Management of coronary calcium as below ?  ?#Coronary calcification: ?#HLD: ?Ca score 166 which is 80% for age-gender matched control ?-Continue crestor 20mg  daily ?-Goal LDL<70 ?-Lifestyle modifications as below ? ?Exercise recommendations: ?Goal of exercising for at least 30 minutes a day, at least 5 times per week.  Please exercise to a moderate exertion.  This means that while exercising it is difficult to speak in full sentences, however you are not so short of breath  that you feel you must stop, and not so comfortable that you can carry on a full conversation.  Exertion level should be approximately a 5/10, if 10 is the most exertion you can perform. ? ?Diet recommendations: ?Recommend a heart healthy diet such as the Mediterranean diet.  This diet consists of plant based foods, healthy fats, lean meats, olive oil.  It suggests limiting the intake of simple carbohydrates such as white breads, pastries, and pastas.  It also limits the amount of red meat, wine, and dairy products such as cheese that one should consume on a daily basis. ? ? ? ?Medication Adjustments/Labs and Tests Ordered: ?Current medicines are reviewed at length with the patient today.  Concerns regarding medicines are outlined above.  ?No orders of the defined types were placed in this encounter. ? ?No orders of the defined types were placed in this encounter. ? ? ?  There are no Patient Instructions on file for this visit. ?  ? ?Signed, ?Freada Bergeron, MD  ?06/18/2021 3:21 PM    ?[place] ?

## 2021-06-20 ENCOUNTER — Encounter: Payer: Self-pay | Admitting: Pulmonary Disease

## 2021-06-20 ENCOUNTER — Ambulatory Visit (INDEPENDENT_AMBULATORY_CARE_PROVIDER_SITE_OTHER): Payer: BC Managed Care – PPO | Admitting: Pulmonary Disease

## 2021-06-20 VITALS — BP 116/76 | HR 73 | Temp 98.0°F | Ht 66.0 in | Wt 206.8 lb

## 2021-06-20 DIAGNOSIS — G4733 Obstructive sleep apnea (adult) (pediatric): Secondary | ICD-10-CM | POA: Diagnosis not present

## 2021-06-20 DIAGNOSIS — Z9989 Dependence on other enabling machines and devices: Secondary | ICD-10-CM

## 2021-06-20 NOTE — Progress Notes (Signed)
? ?      ?Brandon Madden    [id number]    January 27, 1961 ? ?Primary Care Physician:Jordan, Timoteo Expose, MD ? ?Referring Physician: [place], Betty G, MD ?[phone] ?[place],  [place] [address] ? ?Chief complaint:   ?Patient being evaluated for obstructive sleep apnea ? ?HPI: ? ?Diagnosed with obstructive sleep apnea over 10 years ago ?Current machine is about 60 years old ? ?Feels the machine works okay but not as optimally as it did previously ? ?Feels he may be on a pressure of 10 ?Machine does not have downloadable data ? ?Recent diagnosis of atrial fibrillation within the last 6 months to 12 months ? ?He does use his current machine nightly ? ?Usual bedtime is between 10 and 11 PM ?10 to 20 minutes to fall asleep ?5-10 awakenings ?Final wake up time between 4 AM and 6 AM ? ?Whenever he sleeps until about 6 AM, feels more rested ? ?Weight has been relatively stable ? ?He does have a history of hypercholesterolemia and sleep apnea ?He does have a sibling with obstructive sleep apnea ? ?Never smoker ? ?No sleepy driving ?Has occasional memory issues ? ?Outpatient Encounter Medications as of 06/20/2021  ?Medication Sig  ? aspirin EC 81 MG tablet Take 1 tablet (81 mg total) by mouth daily. Swallow whole.  ? diltiazem (CARDIZEM) 30 MG tablet Take 1 tablet (30 mg total) by mouth 4 (four) times daily as needed.  ? rosuvastatin (CRESTOR) 20 MG tablet Take 2 tablets (40 mg total) by mouth daily.  ? ?No facility-administered encounter medications on file as of 06/20/2021.  ? ? ?Allergies as of 06/20/2021 - Review Complete 06/20/2021  ?Allergen Reaction Noted  ? Bee venom Anaphylaxis 11/11/2012  ? ? ?Past Medical History:  ?Diagnosis Date  ? Deafness in right ear   ? Family history of pancreatic cancer   ? Family history of prostate cancer   ? Family history of skin cancer   ? Sleep apnea   ? ? ?Past Surgical History:  ?Procedure Laterality Date  ? NASAL SEPTUM SURGERY    ? ? ?Family History  ?Problem Relation Age of Onset  ?  Heart attack Father   ? Pancreatic cancer Maternal Grandmother   ?     dx 13s, d 72s  ? Skin cancer Sister   ? Prostate cancer Brother   ?     dx 55  ? Prostate cancer Brother   ?     dx 43, metastatic, d 59  ? ? ?Social History  ? ?Socioeconomic History  ? Marital status: Married  ?  Spouse name: [name]  ? Number of children: 4  ? Years of education: Law  ? Highest education level: Not on file  ?Occupational History  ? Occupation: [employer]  ?  Comment: [employer]  ?Tobacco Use  ? Smoking status: Never  ? Smokeless tobacco: Never  ?Substance and Sexual Activity  ? Alcohol use: No  ?  Comment: quit: 2011  ? Drug use: No  ? Sexual activity: Not on file  ?Other Topics Concern  ? Not on file  ?Social History Narrative  ? Right handed  ? Patient lives at home with his family.  ? Caffeine Use: 2 cups daily  ? ?Social Determinants of Health  ? ?Financial Resource Strain: Not on file  ?Food Insecurity: Not on file  ?Transportation Needs: Not on file  ?Physical Activity: Not on file  ?Stress: Not on file  ?Social Connections: Not on file  ?  Intimate Partner Violence: Not on file  ? ? ?Review of Systems  ?Constitutional:  Positive for fatigue.  ?Respiratory:  Positive for apnea. Negative for cough.   ?Psychiatric/Behavioral:  Positive for sleep disturbance.   ? ?Vitals:  ? 06/20/21 1643  ?BP: 116/76  ?Pulse: 73  ?Temp: 98 ?F (36.7 ?C)  ?SpO2: 97%  ? ? ? ?Physical Exam ?Constitutional:   ?   Appearance: He is obese.  ?HENT:  ?   Head: Normocephalic.  ?   Nose: No congestion.  ?   Mouth/Throat:  ?   Mouth: Mucous membranes are moist.  ?   Comments: Mallampati 4, crowded oropharynx ?Eyes:  ?   Pupils: Pupils are equal, round, and reactive to light.  ?Cardiovascular:  ?   Rate and Rhythm: Normal rate and regular rhythm.  ?   Heart sounds: No murmur heard. ?  No friction rub.  ?Pulmonary:  ?   Effort: No respiratory distress.  ?   Breath sounds: No stridor. No wheezing or rhonchi.  ?Musculoskeletal:  ?   Cervical back: No rigidity or  tenderness.  ?Neurological:  ?   Mental Status: He is alert.  ?Psychiatric:     ?   Mood and Affect: Mood normal.  ? ? ?  06/20/2021  ?  4:00 PM  ?Results of the Epworth flowsheet  ?Sitting and reading 2  ?Watching TV 1  ?Sitting, inactive in a public place (e.g. a theatre or a meeting) 0  ?As a passenger in a car for an hour without a break 1  ?Lying down to rest in the afternoon when circumstances permit 1  ?Sitting and talking to someone 0  ?Sitting quietly after a lunch without alcohol 0  ?In a car, while stopped for a few minutes in traffic 0  ?Total score 5  ? ? ?Data Reviewed: ?Previous sleep study is not available for review ? ?Assessment:  ?Moderate probability of significant obstructive sleep apnea ? ?Class I obesity ? ?Pathophysiology of sleep disordered breathing reviewed ?Treatment options reviewed ? ?Recent diagnosis of paroxysmal atrial fibrillation-rate controlled ? ?Plan/Recommendations: ?We will schedule the patient for a home sleep study ? ?Update with results as soon as reviewed ? ?Encouraged to call with significant concerns ? ?For now we will continue using his current machine ? ? ? ?Virl Diamond MD ?[facility] ?06/20/2021, 5:22 PM ? ?CC: [place], Betty G, MD ? ? ? ?

## 2021-06-20 NOTE — Patient Instructions (Signed)
We will schedule you for home sleep study ? ?Continue using your current machine ? ?Weight loss efforts as tolerated ? ?I will see you back in about 3 months ? ?Living With Sleep Apnea ?Sleep apnea is a condition in which breathing pauses or becomes shallow during sleep. Sleep apnea is most commonly caused by a collapsed or blocked airway. People with sleep apnea usually snore loudly. They may have times when they gasp and stop breathing for 10 seconds or more during sleep. This may happen many times during the night. ?The breaks in breathing also interrupt the deep sleep that you need to feel rested. Even if you do not completely wake up from the gaps in breathing, your sleep may not be restful and you feel tired during the day. You may also have a headache in the morning and low energy during the day, and you may feel anxious or depressed. ?How can sleep apnea affect me? ?Sleep apnea increases your chances of extreme tiredness during the day (daytime fatigue). It can also increase your risk for health conditions, such as: ?Heart attack. ?Stroke. ?Obesity. ?Type 2 diabetes. ?Heart failure. ?Irregular heartbeat. ?High blood pressure. ?If you have daytime fatigue as a result of sleep apnea, you may be more likely to: ?Perform poorly at school or work. ?Fall asleep while driving. ?Have difficulty with attention. ?Develop depression or anxiety. ?Have sexual dysfunction. ?What actions can I take to manage sleep apnea? ?Sleep apnea treatment ? ?If you were given a device to open your airway while you sleep, use it only as told by your health care provider. You may be given: ?An oral appliance. This is a custom-made mouthpiece that shifts your lower jaw forward. ?A continuous positive airway pressure (CPAP) device. This device blows air through a mask when you breathe out (exhale). ?A nasal expiratory positive airway pressure (EPAP) device. This device has valves that you put into each nostril. ?A bi-level positive airway  pressure (BIPAP) device. This device blows air through a mask when you breathe in (inhale) and breathe out (exhale). ?You may need surgery if other treatments do not work for you. ?Sleep habits ?Go to sleep and wake up at the same time every day. This helps set your internal clock (circadian rhythm) for sleeping. ?If you stay up later than usual, such as on weekends, try to get up in the morning within 2 hours of your normal wake time. ?Try to get at least 7-9 hours of sleep each night. ?Stop using a computer, tablet, and mobile phone a few hours before bedtime. ?Do not take long naps during the day. If you nap, limit it to 30 minutes. ?Have a relaxing bedtime routine. Reading or listening to music may relax you and help you sleep. ?Use your bedroom only for sleep. ?Keep your television and computer out of your bedroom. ?Keep your bedroom cool, dark, and quiet. ?Use a supportive mattress and pillows. ?Follow your health care provider's instructions for other changes to sleep habits. ?Nutrition ?Do not eat heavy meals in the evening. ?Do not have caffeine in the later part of the day. The effects of caffeine can last for more than 5 hours. ?Follow your health care provider's or dietitian's instructions for any diet changes. ?Lifestyle ? ?  ? ?Do not drink alcohol before bedtime. Alcohol can cause you to fall asleep at first, but then it can cause you to wake up in the middle of the night and have trouble getting back to sleep. ?Do not use  any products that contain nicotine or tobacco. These products include cigarettes, chewing tobacco, and vaping devices, such as e-cigarettes. If you need help quitting, ask your health care provider. ?Medicines ?Take over-the-counter and prescription medicines only as told by your health care provider. ?Do not use over-the-counter sleep medicine. You can become dependent on this medicine, and it can make sleep apnea worse. ?Do not use medicines, such as sedatives and narcotics,  unless told by your health care provider. ?Activity ?Exercise on most days, but avoid exercising in the evening. Exercising near bedtime can interfere with sleeping. ?If possible, spend time outside every day. Natural light helps regulate your circadian rhythm. ?General information ?Lose weight if you need to, and maintain a healthy weight. ?Keep all follow-up visits. This is important. ?If you are having surgery, make sure to tell your health care provider that you have sleep apnea. You may need to bring your device with you. ?Where to find more information ?Learn more about sleep apnea and daytime fatigue from: ?American Sleep Association: sleepassociation.org ?National Sleep Foundation: sleepfoundation.org ?National Heart, Lung, and Blood Institute: BuffaloDryCleaner.gl ?Summary ?Sleep apnea is a condition in which breathing pauses or becomes shallow during sleep. ?Sleep apnea can cause daytime fatigue and other serious health conditions. ?You may need to wear a device while sleeping to help keep your airway open. ?If you are having surgery, make sure to tell your health care provider that you have sleep apnea. You may need to bring your device with you. ?Making changes to sleep habits, diet, lifestyle, and activity can help you manage sleep apnea. ?This information is not intended to replace advice given to you by your health care provider. Make sure you discuss any questions you have with your health care provider. ?Document Revised: 09/15/2020 Document Reviewed: 01/16/2020 ?Elsevier Patient Education ? 2023 Elsevier Inc. ? ?

## 2021-06-22 ENCOUNTER — Encounter: Payer: Self-pay | Admitting: Cardiology

## 2021-06-22 ENCOUNTER — Ambulatory Visit: Payer: BC Managed Care – PPO | Admitting: Cardiology

## 2021-06-22 VITALS — BP 122/80 | HR 82 | Ht 66.0 in | Wt 208.8 lb

## 2021-06-22 DIAGNOSIS — I2510 Atherosclerotic heart disease of native coronary artery without angina pectoris: Secondary | ICD-10-CM | POA: Diagnosis not present

## 2021-06-22 DIAGNOSIS — R002 Palpitations: Secondary | ICD-10-CM

## 2021-06-22 DIAGNOSIS — E785 Hyperlipidemia, unspecified: Secondary | ICD-10-CM

## 2021-06-22 DIAGNOSIS — R55 Syncope and collapse: Secondary | ICD-10-CM | POA: Diagnosis not present

## 2021-06-22 DIAGNOSIS — I480 Paroxysmal atrial fibrillation: Secondary | ICD-10-CM | POA: Diagnosis not present

## 2021-06-22 DIAGNOSIS — R072 Precordial pain: Secondary | ICD-10-CM | POA: Diagnosis not present

## 2021-06-22 MED ORDER — METOPROLOL TARTRATE 100 MG PO TABS: 100.0000 mg | ORAL_TABLET | Freq: Once | ORAL | 0 refills | Status: DC

## 2021-06-22 NOTE — Progress Notes (Signed)
?Cardiology Office Note:   ? ?Date:  06/22/2021  ? ?ID:  Brandon Madden, DOB 05-12-1960, MRN [id number] ? ?PCP:  [place], Betty G, MD ?  ?[facility]  ?Cardiologist:  None  ?Advanced Practice Provider:  No care team member to display ?Electrophysiologist:  Will Jorja LoaMartin Camnitz, MD  ? ?Referring MD: [place], Betty G, MD  ? ? ?History of Present Illness:   ? ?Brandon Madden is a 61 y.o. male with a hx of congenital deafness, HLD and recent COVID infection who presents to clinic for follow-up.  ? ?Patient seen on 04/05/18 for non-exertional chest pain and palpitations. 2 week zio monitor without significant arrhythmias. Coronary calcium score 166 which was 80% for age and sex matched control.  ? ?Called clinic on 06/03/20 for episode of syncope. Specifically, the patient was sitting in a restaurant about to eat when he felt sudden onset of "feeling like he was going into a hole" with dizziness and profound fatigue. He then lost consciousness for 20-25seconds before returning back to baseline mental status. No preceding chest pain, palpitations, SOB. [place] and had completed an 11 mile bike ride the day before. Has had several similar episodes in the past but has not lost consciousness with them. Previously saw neuro where head imaging and EEG was negative. His TTE showed normal EF 55-60%, normal RV, no significant valve disease. CTA negative for PE/dissection. Symptoms sounded possibly vagal, however, given palpitations and syncope, we referred to EP. ? ?Was seen by Dr. Elberta Fortisamnitz on 11/2020 and underwent linq implant which showed 22H episode of Afib. CHADs-vasc 1. [facility]. Was started on dilt 30mg  to start as needed. ? ?Today, the patient states that his energy levels have been okay, improved from one year ago.  ? ?Occasionally he notices episodes of atrial fibrillation, but they are not as frequent lately. In the last month he has noticed an accelerated heart rate  about 3-4 times, usually at night before he goes to sleep. He does take diltiazem as needed for this. He is not sure if the diltiazem is helping significantly, but he notes that he typically is able to fall asleep so it must be helping.  ? ?Every once in a while (maybe once every couple weeks) he feels chest discomfort typically lasting from 30 minutes to 1 hour. He describes this as "a knot" in his left chest. He can feel where it is localized, and he tries to rub the area without any relief. There are no relieving factors to his knowledge. ? ?At this time he is taking testosterone pills for hormone replacement therapy. Recently he was started on 10 mg Clomid, currently he takes this once a week.  Also takes supplements including vitamins, and DHEA once a week. ? ?He continues to work on adjusting his diet. ? ?He denies any shortness of breath, or peripheral edema. No lightheadedness, headaches, syncope, orthopnea, or PND. ? ? ?Past Medical History:  ?Diagnosis Date  ? Deafness in right ear   ? Family history of pancreatic cancer   ? Family history of prostate cancer   ? Family history of skin cancer   ? Sleep apnea   ? ? ?Past Surgical History:  ?Procedure Laterality Date  ? NASAL SEPTUM SURGERY    ? ? ?Current Medications: ?Current Meds  ?Medication Sig  ? aspirin EC 81 MG tablet Take 1 tablet (81 mg total) by mouth daily. Swallow whole.  ? diltiazem (CARDIZEM) 30 MG  tablet Take 1 tablet (30 mg total) by mouth 4 (four) times daily as needed.  ? metoprolol tartrate (LOPRESSOR) 100 MG tablet Take 1 tablet (100 mg total) by mouth once for 1 dose. Take 90-120 minutes prior to scan.  ? rosuvastatin (CRESTOR) 20 MG tablet Take 2 tablets (40 mg total) by mouth daily.  ?  ? ?Allergies:   Bee venom  ? ?Social History  ? ?Socioeconomic History  ? Marital status: Married  ?  Spouse name: [name]  ? Number of children: 4  ? Years of education: Law  ? Highest education level: Not on file  ?Occupational History  ? Occupation:  [employer]  ?  Comment: [employer]  ?Tobacco Use  ? Smoking status: Never  ? Smokeless tobacco: Never  ?Substance and Sexual Activity  ? Alcohol use: No  ?  Comment: quit: 2011  ? Drug use: No  ? Sexual activity: Not on file  ?Other Topics Concern  ? Not on file  ?Social History Narrative  ? Right handed  ? Patient lives at home with his family.  ? Caffeine Use: 2 cups daily  ? ?Social Determinants of Health  ? ?Financial Resource Strain: Not on file  ?Food Insecurity: Not on file  ?Transportation Needs: Not on file  ?Physical Activity: Not on file  ?Stress: Not on file  ?Social Connections: Not on file  ?  ? ?Family History: ?The patient's family history includes Heart attack in his father; Pancreatic cancer in his maternal grandmother; Prostate cancer in his brother and brother; Skin cancer in his sister. ? ?ROS:   ?Please see the history of present illness.    ?Review of Systems  ?Constitutional:  Negative for chills and fever.  ?HENT:  Negative for hearing loss and sore throat.   ?Eyes:  Negative for blurred vision and redness.  ?Respiratory:  Negative for shortness of breath.   ?Cardiovascular:  Positive for chest pain and palpitations. Negative for orthopnea, claudication, leg swelling and PND.  ?Gastrointestinal:  Negative for nausea and vomiting.  ?Genitourinary:  Negative for dysuria and flank pain.  ?Musculoskeletal:  Negative for falls.  ?Neurological:  Negative for dizziness and loss of consciousness.  ?Endo/Heme/Allergies:  Negative for polydipsia.  ?Psychiatric/Behavioral:  Negative for substance abuse.   ? ?EKGs/Labs/Other Studies Reviewed:   ? ?The following studies were reviewed today: ? ?CTA Chest 06/04/2020: ?FINDINGS: ?Cardiovascular: Heart size normal. No pericardial effusion. ?Satisfactory opacification of pulmonary arteries noted, and there is ?no evidence of pulmonary emboli. Scattered coronary calcifications. ?Fair contrast opacification of the thoracic aorta with no evidence ?of dissection,  aneurysm, or stenosis. There is classic 3-vessel ?brachiocephalic arch anatomy without proximal stenosis. Visualized ?proximal abdominal aorta unremarkable. ?  ?Mediastinum/Nodes: No mass or adenopathy. Calcified left hilar lymph ?node. ?  ?Lungs/Pleura: No pleural effusion. No pneumothorax. 4 mm calcified ?granuloma, anterior left upper lobe. Lungs are otherwise clear. ?  ?Upper Abdomen: Scattered diverticula noted near the splenic flexure. ?No acute findings. ?  ?Musculoskeletal: No chest wall abnormality. No acute or significant ?osseous findings. ?  ?Review of the MIP images confirms the above findings. ?  ?IMPRESSION: ?1. Negative for acute PE or thoracic aortic dissection. ?2. Coronary calcifications. The severity of coronary artery disease ?and any potential stenosis cannot be assessed on this non-gated CT ?examination. ? ?Echo 06/03/2020: ? 1. Left ventricular ejection fraction, by estimation, is 55 to 60%. The  ?left ventricle has normal function. The left ventricle has no regional  ?wall motion abnormalities. Left ventricular diastolic  parameters are  ?consistent with Grade I diastolic  ?dysfunction (impaired relaxation).  ? 2. Right ventricular systolic function is normal. The right ventricular  ?size is normal.  ? 3. The mitral valve is normal in structure. Trivial mitral valve  ?regurgitation.  ? 4. The aortic valve is normal in structure. There is mild thickening of  ?the aortic valve. Aortic valve regurgitation is trivial. No aortic  ?stenosis is present.  ? ?Comparison(s): No prior Echocardiogram.  ? ?Cardiac CT scan 04/30/20: ?FINDINGS: ?Non-cardiac: See separate report from [facility]. ?  ?Ascending Aorta: Normal caliber. No calcifications. ?  ?Pericardium: Normal ?  ?Coronary arteries: Normal coronary origins. ?  ?IMPRESSION: ?Coronary calcium score of 166. This was 80th percentile for age and ?sex matched control. ? ?Cardiac monitor 04/27/20: ?Patch wear time was 13 days and 9  hours ?Predominant rhythm was NSR with average HR 84 (ranged from 48-172bpm) ?Rare SVE, rare PVCs <1% ?No Afib, significant pauses or sustained arrhythmias ?Triggered events mainly correlated with PACs. ?Overall, normal card

## 2021-06-22 NOTE — Patient Instructions (Signed)
Medication Instructions:  ? ?Your physician recommends that you continue on your current medications as directed. Please refer to the Current Medication list given to you today. ? ?*If you need a refill on your cardiac medications before your next appointment, please call your pharmacy* ? ? ?Testing/Procedures: ? ? ? ?Your cardiac CT will be scheduled at one of the below locations:  ? ?[facility] ?[address] ?[place], [place] [address] ?[phone] ? ?If scheduled at [facility], please arrive at the [facility] and Children's Entrance (Entrance C2) of [facility] 30 minutes prior to test start time. ?You can use the FREE valet parking offered at entrance C (encouraged to control the heart rate for the test)  ?Proceed to the [facility] Radiology Department (first floor) to check-in and test prep. ? ?All radiology patients and guests should use entrance C2 at [facility], accessed from [facility], even though the hospital's physical address listed is [address] ? ? ? ? ?Please follow these instructions carefully (unless otherwise directed): ? ?Hold all erectile dysfunction medications at least 3 days (72 hrs) prior to test. ? ?On the Night Before the Test: ?Be sure to Drink plenty of water. ?Do not consume any caffeinated/decaffeinated beverages or chocolate 12 hours prior to your test. ?Do not take any antihistamines 12 hours prior to your test. ? ?On the Day of the Test: ?Drink plenty of water until 1 hour prior to the test. ?Do not eat any food 4 hours prior to the test. ?You may take your regular medications prior to the test.  ?Take metoprolol 100 mg by mouth (Lopressor) two hours prior to test. ? ?     ?After the Test: ?Drink plenty of water. ?After receiving IV contrast, you may experience a mild flushed feeling. This is normal. ?On occasion, you may experience a mild rash up to 24 hours after the test. This is not dangerous. If this occurs, you  can take Benadryl 25 mg and increase your fluid intake. ?If you experience trouble breathing, this can be serious. If it is severe call 911 IMMEDIATELY. If it is mild, please call our office. ? ?We will call to schedule your test 2-4 weeks out understanding that some insurance companies will need an authorization prior to the service being performed.  ? ?For non-scheduling related questions, please contact the cardiac imaging nurse navigator should you have any questions/concerns: ?Marchia Bond, Cardiac Imaging Nurse Navigator ?Gordy Clement, Cardiac Imaging Nurse Navigator ?[facility] ?Direct Office Dial: [phone]  ? ?For scheduling needs, including cancellations and rescheduling, please call [place], [phone]. ? ? ? ?Follow-Up: ?At [facility], you and your health needs are our priority.  As part of our continuing mission to provide you with exceptional heart care, we have created designated Provider Care Teams.  These Care Teams include your primary Cardiologist (physician) and Advanced Practice Providers (APPs -  Physician Assistants and Nurse Practitioners) who all work together to provide you with the care you need, when you need it. ? ?We recommend signing up for the patient portal called "MyChart".  Sign up information is provided on this After Visit Summary.  MyChart is used to connect with patients for Virtual Visits (Telemedicine).  Patients are able to view lab/test results, encounter notes, upcoming appointments, etc.  Non-urgent messages can be sent to your provider as well.   ?To learn more about what you can do with MyChart, go to [email or web address].   ? ?  Your next appointment:   ?6 month(s) ? ?The format for your next appointment:   ?In Person ? ?Provider:   ?Robbie Lis, PA-C, Nicholes Rough, PA-C, Melina Copa, PA-C, Ambrose Pancoast, NP, Cecilie Kicks, NP, Ermalinda Barrios, PA-C, Christen Bame, NP, or Richardson Dopp, PA-C     { ? ?Important Information About  [place] ? ? ? ? ? ? ?

## 2021-06-24 ENCOUNTER — Telehealth: Payer: Self-pay | Admitting: *Deleted

## 2021-06-24 NOTE — Telephone Encounter (Signed)
-----   Message from Lorrin Jackson sent at 06/24/2021  1:26 PM EDT ----- ?Regarding: ct heart ?Scheduled 07/11/21 at 11:00  ? ?

## 2021-07-01 NOTE — Progress Notes (Signed)
Carelink Summary Report / Loop Recorder 

## 2021-07-08 ENCOUNTER — Telehealth (HOSPITAL_COMMUNITY): Payer: Self-pay | Admitting: *Deleted

## 2021-07-08 NOTE — Telephone Encounter (Signed)
Attempted to call patient regarding upcoming cardiac CT appointment. °Left message on voicemail with name and callback number ° °Tirzah Fross RN Navigator Cardiac Imaging °[facility] °[phone] Office °[phone] Cell ° °

## 2021-07-11 ENCOUNTER — Ambulatory Visit (HOSPITAL_COMMUNITY)
Admission: RE | Admit: 2021-07-11 | Discharge: 2021-07-11 | Disposition: A | Discharge status: Home / Self Care | Payer: BC Managed Care – PPO | Source: Ambulatory Visit | Attending: Cardiovascular Disease | Admitting: Cardiovascular Disease

## 2021-07-11 ENCOUNTER — Ambulatory Visit (HOSPITAL_COMMUNITY)
Admission: RE | Admit: 2021-07-11 | Discharge: 2021-07-11 | Disposition: A | Discharge status: Home / Self Care | Payer: BC Managed Care – PPO | Source: Ambulatory Visit | Attending: Cardiology | Admitting: Cardiology

## 2021-07-11 ENCOUNTER — Ambulatory Visit (HOSPITAL_BASED_OUTPATIENT_CLINIC_OR_DEPARTMENT_OTHER)
Admission: RE | Admit: 2021-07-11 | Discharge: 2021-07-11 | Disposition: A | Discharge status: Home / Self Care | Payer: BC Managed Care – PPO | Source: Ambulatory Visit | Attending: Cardiovascular Disease | Admitting: Cardiovascular Disease

## 2021-07-11 ENCOUNTER — Other Ambulatory Visit: Payer: Self-pay | Admitting: Cardiovascular Disease

## 2021-07-11 DIAGNOSIS — R072 Precordial pain: Secondary | ICD-10-CM | POA: Diagnosis present

## 2021-07-11 DIAGNOSIS — I2510 Atherosclerotic heart disease of native coronary artery without angina pectoris: Secondary | ICD-10-CM

## 2021-07-11 DIAGNOSIS — R931 Abnormal findings on diagnostic imaging of heart and coronary circulation: Secondary | ICD-10-CM

## 2021-07-11 MED ORDER — NITROGLYCERIN 0.4 MG SL SUBL
0.8000 mg | SUBLINGUAL_TABLET | Freq: Once | SUBLINGUAL | Status: AC
  Administered 2021-07-11: 0.8 mg via SUBLINGUAL

## 2021-07-11 MED ORDER — NITROGLYCERIN 0.4 MG SL SUBL
SUBLINGUAL_TABLET | SUBLINGUAL | Status: AC
  Filled 2021-07-11: qty 2

## 2021-07-11 MED ORDER — IOHEXOL 350 MG/ML SOLN
100.0000 mL | Freq: Once | INTRAVENOUS | Status: AC | PRN
  Administered 2021-07-11: 100 mL via INTRAVENOUS

## 2021-07-11 NOTE — Progress Notes (Signed)
CT FFR ordered.  Gerri Spore T. Flora Lipps, MD, [facility]  [facility]  [address] [place], [place] [address] [phone]  1:59 PM

## 2021-07-12 ENCOUNTER — Other Ambulatory Visit: Payer: Self-pay | Admitting: *Deleted

## 2021-07-12 MED ORDER — ROSUVASTATIN CALCIUM 20 MG PO TABS: 40.0000 mg | ORAL_TABLET | Freq: Every day | ORAL | 3 refills | Status: DC

## 2021-07-13 ENCOUNTER — Ambulatory Visit: Payer: BC Managed Care – PPO | Admitting: Physician Assistant

## 2021-07-13 ENCOUNTER — Encounter: Payer: Self-pay | Admitting: Physician Assistant

## 2021-07-13 VITALS — BP 124/82 | HR 80 | Ht 66.0 in | Wt 205.0 lb

## 2021-07-13 DIAGNOSIS — I25119 Atherosclerotic heart disease of native coronary artery with unspecified angina pectoris: Secondary | ICD-10-CM | POA: Diagnosis not present

## 2021-07-13 DIAGNOSIS — E785 Hyperlipidemia, unspecified: Secondary | ICD-10-CM | POA: Diagnosis not present

## 2021-07-13 DIAGNOSIS — I2510 Atherosclerotic heart disease of native coronary artery without angina pectoris: Secondary | ICD-10-CM | POA: Insufficient documentation

## 2021-07-13 DIAGNOSIS — I480 Paroxysmal atrial fibrillation: Secondary | ICD-10-CM | POA: Diagnosis not present

## 2021-07-13 DIAGNOSIS — R55 Syncope and collapse: Secondary | ICD-10-CM

## 2021-07-13 HISTORY — DX: Atherosclerotic heart disease of native coronary artery without angina pectoris: I25.10

## 2021-07-13 MED ORDER — NITROGLYCERIN 0.4 MG SL SUBL: 0.4000 mg | SUBLINGUAL_TABLET | SUBLINGUAL | 3 refills | Status: AC | PRN

## 2021-07-13 MED ORDER — METOPROLOL SUCCINATE ER 25 MG PO TB24: 25.0000 mg | ORAL_TABLET | Freq: Every day | ORAL | 3 refills | Status: DC

## 2021-07-13 NOTE — Assessment & Plan Note (Signed)
Goal LDL < 70.  Continue Rosuvastatin 40 mg once daily.

## 2021-07-13 NOTE — Assessment & Plan Note (Signed)
Maintaining normal sinus rhythm.  He is not on anticoagulation due to low stroke risk.

## 2021-07-13 NOTE — Progress Notes (Signed)
Cardiology Office Note:    Date:  07/13/2021   ID:  Brandon Madden, DOB 18-Nov-1960, MRN [id number]  PCP:  Martinique, Betty G, MD  [facility] Cardiologist:  Freada Bergeron, MD Electrophysiologist:  Constance Haw, MD    Referring MD: Martinique, Betty G, MD   Chief Complaint:  F/u on abnormal CCTA; arrange cardiac cath    Patient Profile: Coronary artery disease Hyperlipidemia  Congenital deafness Syncope  S/p ILR Paroxysmal atrial fibrillation  CHA2DS2-VASc Score = 1 [CHF History: 0, HTN History: 0, Diabetes History: 0, Stroke History: 0, Vascular Disease History: 1, Age Score: 0, Gender Score: 0] >> No anticoagulation  Low testosterone on replacement  OSA  Prior CV Studies: Coronary CTA 07/11/2021 IMPRESSION: 1. Coronary calcium score of 260. This was 84th percentile for age-, sex, and race-matched controls. 2. Normal coronary origin with right dominance. 3.  Severe mid LAD stenosis (70-99%). 4.  Mild mixed density plaque (25-49%) in the LCX.  5.  Minimal plaque in the RCA (<25%).  FINDINGS: 1. Left Main: 0.99; low likelihood of hemodynamic significance.  2. Prox LAD: 0.96; low likelihood of hemodynamic significance. 3. Mid LAD: 0.60; high likelihood of hemodynamic significance. 4. LCX: 0.92; low likelihood of hemodynamic significance. 5. RCA: 0.93; low likelihood of hemodynamic significance.   IMPRESSION:  1.  Mid LAD is positive by CT FFR (0.60).  Echocardiogram 06/03/2020 EF 55-60, no RWMA, GR 1 DD, normal RVSF, trivial MR, trivial AI  History of Present Illness:   Brandon Madden is a 61 y.o. male with the above problem list.  He was evaluated by Dr. Johney Frame 06/22/2021 for chest pain.  His symptoms were somewhat atypical for ischemia.  However, given his strong family history of CAD and prior history of syncope, coronary CTA was arranged.  This demonstrated possible severe mid LAD stenosis.  FFR demonstrated hemodynamically significant stenosis in the  mid LAD (0.60).  He returns to arrange cardiac catheterization.  He is here alone.  He has noted substernal pressure constantly for several weeks now.  He notes it more with stress.  It has not really gotten any worse.  He has been more concerned about the results of his CT and has been thinking about his discomfort more.  He has also noted his BP is higher than usual for him.  He has not had significant shortness of breath, edema.  He uses CPAP at night.  He has not had recurrent syncope.     Past Medical History:  Diagnosis Date   Deafness in right ear    Family history of pancreatic cancer    Family history of prostate cancer    Family history of skin cancer    Sleep apnea    Current Medications: Current Meds  Medication Sig   aspirin EC 81 MG tablet Take 1 tablet (81 mg total) by mouth daily. Swallow whole.   diltiazem (CARDIZEM) 30 MG tablet Take 1 tablet (30 mg total) by mouth 4 (four) times daily as needed.   metoprolol succinate (TOPROL XL) 25 MG 24 hr tablet Take 1 tablet (25 mg total) by mouth daily.   Multiple Vitamins-Minerals (MULTI FOR HIM) TABS Take 2 tablets by mouth daily.   nitroGLYCERIN (NITROSTAT) 0.4 MG SL tablet Place 1 tablet (0.4 mg total) under the tongue every 5 (five) minutes as needed for chest pain.   rosuvastatin (CRESTOR) 20 MG tablet Take 2 tablets (40 mg total) by mouth daily.    Allergies:   Bee venom  Social History   Tobacco Use   Smoking status: Never   Smokeless tobacco: Never  Substance Use Topics   Alcohol use: No    Comment: quit: 2011   Drug use: No    Family Hx: The patient's family history includes Heart attack in his father; Pancreatic cancer in his maternal grandmother; Prostate cancer in his brother and brother; Skin cancer in his sister.  Review of Systems  Gastrointestinal:  Negative for hematochezia.  Genitourinary:  Negative for hematuria.    EKGs/Labs/Other Test Reviewed:    EKG:  EKG is   ordered today.  The ekg ordered  today demonstrates NSR, HR 80, normal axis, no ST-TW changes, QTc 410 ms  Recent Labs: No results found for requested labs within last 8760 hours.   Recent Lipid Panel Recent Labs    09/30/20 0809  CHOL 171  TRIG 83  HDL 45  LDLCALC 110*     Risk Assessment/Calculations:         Physical Exam:    VS:  BP 124/82   Pulse 80   Ht 5\' 6"  (1.676 m)   Wt 205 lb (93 kg)   SpO2 96%   BMI 33.09 kg/m     Wt Readings from Last 3 Encounters:  07/13/21 205 lb (93 kg)  06/22/21 208 lb 12.8 oz (94.7 kg)  06/20/21 206 lb 12.8 oz (93.8 kg)    Constitutional:      Appearance: Healthy appearance. Not in distress.  Neck:     Vascular: No JVR. JVD normal.  Pulmonary:     Effort: Pulmonary effort is normal.     Breath sounds: No wheezing. No rales.  Cardiovascular:     Normal rate. Regular rhythm. Normal S1. Normal S2.      Murmurs: There is no murmur.  Pulses:    Intact distal pulses.  Edema:    Peripheral edema absent.  Abdominal:     Palpations: Abdomen is soft. There is no hepatomegaly.  Skin:    General: Skin is warm and dry.  Neurological:     General: No focal deficit present.     Mental Status: Alert and oriented to person, place and time.     Cranial Nerves: Cranial nerves are intact.         ASSESSMENT & PLAN:   CAD (coronary artery disease) He has had fairly constant chest discomfort for a couple of weeks now.  His symptoms are stable.  CCTA demonstrates mid LAD lesion that is likely hemodynamically significant (FFR 0.6).  His EKG today is normal.  He is eager to proceed with cardiac catheterization for definitive management.  We reviewed his CCTA and FFR images.  We discussed the cardiac catheterization procedure in detail today and reviewed risks and benefits.  His HR is in the 80s and I think his BP can tolerate low dose beta-blocker for now.  I will also give him a Rx for prn NTG.  He knows if his symptoms should escalate or change, he should call 911 and go to the  ED.  BMET, CBC today Cardiac catheterization with Dr. Tamala Julian May 26 Toprol XL 25 mg once daily NTG prn chest pain  Continue ASA 81 mg once daily, Rosuvastatin 40 mg once daily  F/u 2 weeks post cath  Hyperlipidemia LDL goal <70 Goal LDL < 70.  Continue Rosuvastatin 40 mg once daily.   PAF (paroxysmal atrial fibrillation) (HCC) Maintaining normal sinus rhythm.  He is not on anticoagulation due to low  stroke risk.   Syncope and collapse No recurrent syncope.  He is s/p ILR.         Shared Decision Making/Informed Consent The risks [stroke (1 in 1000), death (1 in 1000), kidney failure [usually temporary] (1 in 500), bleeding (1 in 200), allergic reaction [possibly serious] (1 in 200)], benefits (diagnostic support and management of coronary artery disease) and alternatives of a cardiac catheterization were discussed in detail with Mr. Haizlip and he is willing to proceed.   Dispo:  Return for Post Procedure Follow Up.   Medication Adjustments/Labs and Tests Ordered: Current medicines are reviewed at length with the patient today.  Concerns regarding medicines are outlined above.  Tests Ordered: Orders Placed This Encounter  Procedures   Basic metabolic panel   CBC   EKG 12-Lead   Medication Changes: Meds ordered this encounter  Medications   nitroGLYCERIN (NITROSTAT) 0.4 MG SL tablet    Sig: Place 1 tablet (0.4 mg total) under the tongue every 5 (five) minutes as needed for chest pain.    Dispense:  90 tablet    Refill:  3   metoprolol succinate (TOPROL XL) 25 MG 24 hr tablet    Sig: Take 1 tablet (25 mg total) by mouth daily.    Dispense:  90 tablet    Refill:  3   Signed, Richardson Dopp, PA-C  07/13/2021 3:39 PM    [facility] [place], [place], [place]  [address] Phone: [phone]; Fax: [phone]

## 2021-07-13 NOTE — H&P (View-Only) (Signed)
Cardiology Office Note:    Date:  07/13/2021   ID:  Brandon Madden, DOB 07/30/1960, MRN [id number]  PCP:  Jordan, Betty G, MD  [facility] Prov[facility] Cardiologist:  Heather E Pemberton, MD Electrophysiologist:  Will Martin Camnitz, MD    Referring MD: Jordan, Betty G, MD   Chief Complaint:  F/u on abnormal CCTA; arrange cardiac cath    Patient Profile: Coronary artery disease Hyperlipidemia  Congenital deafness Syncope  S/p ILR Paroxysmal atrial fibrillation  CHA2DS2-VASc Score = 1 [CHF History: 0, HTN History: 0, Diabetes History: 0, Stroke History: 0, Vascular Disease History: 1, Age Score: 0, Gender Score: 0] >> No anticoagulation  Low testosterone on replacement  OSA  Prior CV Studies: Coronary CTA 07/11/2021 IMPRESSION: 1. Coronary calcium score of 260. This was 84th percentile for age-, sex, and race-matched controls. 2. Normal coronary origin with right dominance. 3.  Severe mid LAD stenosis (70-99%). 4.  Mild mixed density plaque (25-49%) in the LCX.  5.  Minimal plaque in the RCA (<25%).  FINDINGS: 1. Left Main: 0.99; low likelihood of hemodynamic significance.  2. Prox LAD: 0.96; low likelihood of hemodynamic significance. 3. Mid LAD: 0.60; high likelihood of hemodynamic significance. 4. LCX: 0.92; low likelihood of hemodynamic significance. 5. RCA: 0.93; low likelihood of hemodynamic significance.   IMPRESSION:  1.  Mid LAD is positive by CT FFR (0.60).  Echocardiogram 06/03/2020 EF 55-60, no RWMA, GR 1 DD, normal RVSF, trivial MR, trivial AI  History of Present Illness:   Brandon Madden is a 61 y.o. male with the above problem list.  He was evaluated by Dr. Pemberton 06/22/2021 for chest pain.  His symptoms were somewhat atypical for ischemia.  However, given his strong family history of CAD and prior history of syncope, coronary CTA was arranged.  This demonstrated possible severe mid LAD stenosis.  FFR demonstrated hemodynamically significant stenosis in the  mid LAD (0.60).  He returns to arrange cardiac catheterization.  He is here alone.  He has noted substernal pressure constantly for several weeks now.  He notes it more with stress.  It has not really gotten any worse.  He has been more concerned about the results of his CT and has been thinking about his discomfort more.  He has also noted his BP is higher than usual for him.  He has not had significant shortness of breath, edema.  He uses CPAP at night.  He has not had recurrent syncope.     Past Medical History:  Diagnosis Date   Deafness in right ear    Family history of pancreatic cancer    Family history of prostate cancer    Family history of skin cancer    Sleep apnea    Current Medications: Current Meds  Medication Sig   aspirin EC 81 MG tablet Take 1 tablet (81 mg total) by mouth daily. Swallow whole.   diltiazem (CARDIZEM) 30 MG tablet Take 1 tablet (30 mg total) by mouth 4 (four) times daily as needed.   metoprolol succinate (TOPROL XL) 25 MG 24 hr tablet Take 1 tablet (25 mg total) by mouth daily.   Multiple Vitamins-Minerals (MULTI FOR HIM) TABS Take 2 tablets by mouth daily.   nitroGLYCERIN (NITROSTAT) 0.4 MG SL tablet Place 1 tablet (0.4 mg total) under the tongue every 5 (five) minutes as needed for chest pain.   rosuvastatin (CRESTOR) 20 MG tablet Take 2 tablets (40 mg total) by mouth daily.    Allergies:   Bee venom     Social History   Tobacco Use   Smoking status: Never   Smokeless tobacco: Never  Substance Use Topics   Alcohol use: No    Comment: quit: 2011   Drug use: No    Family Hx: The patient's family history includes Heart attack in his father; Pancreatic cancer in his maternal grandmother; Prostate cancer in his brother and brother; Skin cancer in his sister.  Review of Systems  Gastrointestinal:  Negative for hematochezia.  Genitourinary:  Negative for hematuria.    EKGs/Labs/Other Test Reviewed:    EKG:  EKG is   ordered today.  The ekg ordered  today demonstrates NSR, HR 80, normal axis, no ST-TW changes, QTc 410 ms  Recent Labs: No results found for requested labs within last 8760 hours.   Recent Lipid Panel Recent Labs    09/30/20 0809  CHOL 171  TRIG 83  HDL 45  LDLCALC 110*     Risk Assessment/Calculations:         Physical Exam:    VS:  BP 124/82   Pulse 80   Ht 5' 6" (1.676 m)   Wt 205 lb (93 kg)   SpO2 96%   BMI 33.09 kg/m     Wt Readings from Last 3 Encounters:  07/13/21 205 lb (93 kg)  06/22/21 208 lb 12.8 oz (94.7 kg)  06/20/21 206 lb 12.8 oz (93.8 kg)    Constitutional:      Appearance: Healthy appearance. Not in distress.  Neck:     Vascular: No JVR. JVD normal.  Pulmonary:     Effort: Pulmonary effort is normal.     Breath sounds: No wheezing. No rales.  Cardiovascular:     Normal rate. Regular rhythm. Normal S1. Normal S2.      Murmurs: There is no murmur.  Pulses:    Intact distal pulses.  Edema:    Peripheral edema absent.  Abdominal:     Palpations: Abdomen is soft. There is no hepatomegaly.  Skin:    General: Skin is warm and dry.  Neurological:     General: No focal deficit present.     Mental Status: Alert and oriented to person, place and time.     Cranial Nerves: Cranial nerves are intact.         ASSESSMENT & PLAN:   CAD (coronary artery disease) He has had fairly constant chest discomfort for a couple of weeks now.  His symptoms are stable.  CCTA demonstrates mid LAD lesion that is likely hemodynamically significant (FFR 0.6).  His EKG today is normal.  He is eager to proceed with cardiac catheterization for definitive management.  We reviewed his CCTA and FFR images.  We discussed the cardiac catheterization procedure in detail today and reviewed risks and benefits.  His HR is in the 80s and I think his BP can tolerate low dose beta-blocker for now.  I will also give him a Rx for prn NTG.  He knows if his symptoms should escalate or change, he should call 911 and go to the  ED.  BMET, CBC today Cardiac catheterization with Dr. Smith May 26 Toprol XL 25 mg once daily NTG prn chest pain  Continue ASA 81 mg once daily, Rosuvastatin 40 mg once daily  F/u 2 weeks post cath  Hyperlipidemia LDL goal <70 Goal LDL < 70.  Continue Rosuvastatin 40 mg once daily.   PAF (paroxysmal atrial fibrillation) (HCC) Maintaining normal sinus rhythm.  He is not on anticoagulation due to low   stroke risk.   Syncope and collapse No recurrent syncope.  He is s/p ILR.         Shared Decision Making/Informed Consent The risks [stroke (1 in 1000), death (1 in 1000), kidney failure [usually temporary] (1 in 500), bleeding (1 in 200), allergic reaction [possibly serious] (1 in 200)], benefits (diagnostic support and management of coronary artery disease) and alternatives of a cardiac catheterization were discussed in detail with Mr. Yam and he is willing to proceed.   Dispo:  Return for Post Procedure Follow Up.   Medication Adjustments/Labs and Tests Ordered: Current medicines are reviewed at length with the patient today.  Concerns regarding medicines are outlined above.  Tests Ordered: Orders Placed This Encounter  Procedures   Basic metabolic panel   CBC   EKG 12-Lead   Medication Changes: Meds ordered this encounter  Medications   nitroGLYCERIN (NITROSTAT) 0.4 MG SL tablet    Sig: Place 1 tablet (0.4 mg total) under the tongue every 5 (five) minutes as needed for chest pain.    Dispense:  90 tablet    Refill:  3   metoprolol succinate (TOPROL XL) 25 MG 24 hr tablet    Sig: Take 1 tablet (25 mg total) by mouth daily.    Dispense:  90 tablet    Refill:  3   Signed, Justun Anaya, PA-C  07/13/2021 3:39 PM    [facility] [address], [place], [place]  [address] Phone: [phone]; Fax: [phone]  

## 2021-07-13 NOTE — Assessment & Plan Note (Signed)
No recurrent syncope.  He is s/p ILR.

## 2021-07-13 NOTE — Patient Instructions (Signed)
Medication Instructions:  Your physician has recommended you make the following change in your medication:   START Toprol Xl 25 mg taking 1 daily.Marland Kitchen START TONIGHT  START Nitroglycerin 0.4 s/l tablets... The proper use and anticipated side effects of nitroglycerine has been carefully explained.  If a single episode of chest pain is not relieved by one tablet, the patient will try another within 5 minutes; and if this doesn't relieve the pain, the patient is instructed to call 911 for transportation to an emergency department.   *If you need a refill on your cardiac medications before your next appointment, please call your pharmacy*   Lab Work: TODAY:  BMET & CBC  If you have labs (blood work) drawn today and your tests are completely normal, you will receive your results only by: MyChart Message (if you have MyChart) OR A paper copy in the mail If you have any lab test that is abnormal or we need to change your treatment, we will call you to review the results.   Testing/Procedures: Your physician has requested that you have a cardiac catheterization. Cardiac catheterization is used to diagnose and/or treat various heart conditions. Doctors may recommend this procedure for a number of different reasons. The most common reason is to evaluate chest pain. Chest pain can be a symptom of coronary artery disease (CAD), and cardiac catheterization can show whether plaque is narrowing or blocking your heart's arteries. This procedure is also used to evaluate the valves, as well as measure the blood flow and oxygen levels in different parts of your heart. For further information please visit [email or web address] Please follow instruction sheet, BELOW:   [facility] CARDIOVASCULAR DIVISION [facility] OFFICE [address] [place], [address] [place] [address] Dept: [phone] Loc: [phone]  Brandon Madden  07/13/2021  You are scheduled for a Cardiac  Catheterization on Friday, May 26 with Dr. Verdis Prime.  1. Please arrive at the Main Entrance A at [facility]: [address] [place], [place] [address] at 11:00 AM (This time is two hours before your procedure to ensure your preparation). Free valet parking service is available.   Special note: Every effort is made to have your procedure done on time. Please understand that emergencies sometimes delay scheduled procedures.  2. Diet: Do not eat solid foods after midnight.  You may have clear liquids until 5 AM upon the day of the procedure.  3. Labs: WILL BE DONE TODAY  4. Medication instructions in preparation for your procedure:   Contrast Allergy: No   On the morning of your procedure, take Aspirin and any morning medicines NOT listed above.  You may use sips of water.  5. Plan to go home the same day, you will only stay overnight if medically necessary. 6. You MUST have a responsible adult to drive you home. 7. An adult MUST be with you the first 24 hours after you arrive home. 8. Bring a current list of your medications, and the last time and date medication taken. 9. Bring ID and current insurance cards. 10.Please wear clothes that are easy to get on and off and wear slip-on shoes.  Thank you for allowing [place] to care for you!   --  Invasive Cardiovascular services     Follow-Up: At [facility], you and your health needs are our priority.  As part of our continuing mission to provide you with exceptional heart care, we have created designated Provider Care Teams.  These Care Teams include your primary Cardiologist (physician) and Advanced Practice Providers (APPs -  Physician Assistants and Nurse Practitioners) who all work together to provide you with the care you need, when you need it.  We recommend signing up for the patient portal called "MyChart".  Sign up information is provided on this After Visit Summary.  MyChart is used to connect with patients  for Virtual Visits (Telemedicine).  Patients are able to view lab/test results, encounter notes, upcoming appointments, etc.  Non-urgent messages can be sent to your provider as well.   To learn more about what you can do with MyChart, go to [email or web address].    Your next appointment:   2 week(s)  The format for your next appointment:   In Person  Provider:   Tereso Newcomer, PA-C         Other Instructions   Important Information About Sugar

## 2021-07-13 NOTE — Assessment & Plan Note (Addendum)
He has had fairly constant chest discomfort for a couple of weeks now.  His symptoms are stable.  CCTA demonstrates mid LAD lesion that is likely hemodynamically significant (FFR 0.6).  His EKG today is normal.  He is eager to proceed with cardiac catheterization for definitive management.  We reviewed his CCTA and FFR images.  We discussed the cardiac catheterization procedure in detail today and reviewed risks and benefits.  His HR is in the 80s and I think his BP can tolerate low dose beta-blocker for now.  I will also give him a Rx for prn NTG.  He knows if his symptoms should escalate or change, he should call 911 and go to the ED.   BMET, CBC today  Cardiac catheterization with Dr. Katrinka Blazing May 26  Toprol XL 25 mg once daily  NTG prn chest pain   Continue ASA 81 mg once daily, Rosuvastatin 40 mg once daily   F/u 2 weeks post cath

## 2021-07-14 LAB — CBC
Hematocrit: 50.2 % (ref 37.5–51.0)
Hemoglobin: 17.5 g/dL (ref 13.0–17.7)
MCH: 32.3 pg (ref 26.6–33.0)
MCHC: 34.9 g/dL (ref 31.5–35.7)
MCV: 93 fL (ref 79–97)
Platelets: 196 10*3/uL (ref 150–450)
RBC: 5.41 x10E6/uL (ref 4.14–5.80)
RDW: 13 % (ref 11.6–15.4)
WBC: 6.8 10*3/uL (ref 3.4–10.8)

## 2021-07-14 LAB — BASIC METABOLIC PANEL
BUN/Creatinine Ratio: 20 (ref 9–20)
BUN: 19 mg/dL (ref 6–24)
CO2: 23 mmol/L (ref 20–29)
Calcium: 9.2 mg/dL (ref 8.7–10.2)
Chloride: 104 mmol/L (ref 96–106)
Creatinine, Ser: 0.96 mg/dL (ref 0.76–1.27)
Glucose: 94 mg/dL (ref 70–99)
Potassium: 4.4 mmol/L (ref 3.5–5.2)
Sodium: 140 mmol/L (ref 134–144)
eGFR: 91 mL/min/{1.73_m2} (ref >59)

## 2021-07-14 NOTE — H&P (Signed)
1. Coronary calcium score of 260. This was 84th percentile for age-, sex, and race-matched controls. 2. Normal coronary origin with right dominance. 3.  Severe mid LAD stenosis (70-99%). 4.  Mild mixed density plaque (25-49%) in the LCX.  5.  Minimal plaque in the RCA (<25%).  Images reviewed.  Tight mid LAD.  Consider IVUS guidance PCI.  Care with contrast volume.

## 2021-07-15 ENCOUNTER — Ambulatory Visit (HOSPITAL_COMMUNITY)
Admission: RE | Admit: 2021-07-15 | Discharge: 2021-07-15 | Disposition: A | Discharge status: Home / Self Care | Payer: BC Managed Care – PPO | Attending: Interventional Cardiology | Admitting: Interventional Cardiology

## 2021-07-15 ENCOUNTER — Other Ambulatory Visit (HOSPITAL_COMMUNITY): Payer: Self-pay

## 2021-07-15 ENCOUNTER — Encounter (HOSPITAL_COMMUNITY)
Admission: RE | Disposition: A | Discharge status: Home / Self Care | Payer: Self-pay | Source: Home / Self Care | Attending: Interventional Cardiology

## 2021-07-15 DIAGNOSIS — G4733 Obstructive sleep apnea (adult) (pediatric): Secondary | ICD-10-CM | POA: Insufficient documentation

## 2021-07-15 DIAGNOSIS — Z8249 Family history of ischemic heart disease and other diseases of the circulatory system: Secondary | ICD-10-CM | POA: Insufficient documentation

## 2021-07-15 DIAGNOSIS — I25119 Atherosclerotic heart disease of native coronary artery with unspecified angina pectoris: Secondary | ICD-10-CM | POA: Diagnosis not present

## 2021-07-15 DIAGNOSIS — E785 Hyperlipidemia, unspecified: Secondary | ICD-10-CM | POA: Diagnosis not present

## 2021-07-15 DIAGNOSIS — I2510 Atherosclerotic heart disease of native coronary artery without angina pectoris: Secondary | ICD-10-CM | POA: Insufficient documentation

## 2021-07-15 DIAGNOSIS — Z6830 Body mass index (BMI) 30.0-30.9, adult: Secondary | ICD-10-CM | POA: Insufficient documentation

## 2021-07-15 DIAGNOSIS — E669 Obesity, unspecified: Secondary | ICD-10-CM | POA: Insufficient documentation

## 2021-07-15 DIAGNOSIS — Z7982 Long term (current) use of aspirin: Secondary | ICD-10-CM | POA: Diagnosis not present

## 2021-07-15 DIAGNOSIS — H9041 Sensorineural hearing loss, unilateral, right ear, with unrestricted hearing on the contralateral side: Secondary | ICD-10-CM | POA: Insufficient documentation

## 2021-07-15 DIAGNOSIS — Z79899 Other long term (current) drug therapy: Secondary | ICD-10-CM | POA: Diagnosis not present

## 2021-07-15 DIAGNOSIS — I480 Paroxysmal atrial fibrillation: Secondary | ICD-10-CM | POA: Diagnosis not present

## 2021-07-15 DIAGNOSIS — E66811 Obesity, class 1: Secondary | ICD-10-CM | POA: Diagnosis present

## 2021-07-15 DIAGNOSIS — Z955 Presence of coronary angioplasty implant and graft: Secondary | ICD-10-CM

## 2021-07-15 HISTORY — PX: CORONARY STENT INTERVENTION: CATH118234

## 2021-07-15 HISTORY — PX: LEFT HEART CATH AND CORONARY ANGIOGRAPHY: CATH118249

## 2021-07-15 HISTORY — PX: INTRAVASCULAR ULTRASOUND/IVUS: CATH118244

## 2021-07-15 HISTORY — PX: CORONARY ULTRASOUND/IVUS: CATH118244

## 2021-07-15 LAB — POCT ACTIVATED CLOTTING TIME
Activated Clotting Time: 408 seconds
Activated Clotting Time: 468 seconds

## 2021-07-15 SURGERY — LEFT HEART CATH AND CORONARY ANGIOGRAPHY
Anesthesia: LOCAL

## 2021-07-15 MED ORDER — VERAPAMIL HCL 2.5 MG/ML IV SOLN
INTRAVENOUS | Status: AC
  Filled 2021-07-15: qty 2

## 2021-07-15 MED ORDER — HEPARIN SODIUM (PORCINE) 1000 UNIT/ML IJ SOLN
INTRAMUSCULAR | Status: AC
  Filled 2021-07-15: qty 10

## 2021-07-15 MED ORDER — LIDOCAINE HCL (PF) 1 % IJ SOLN
INTRAMUSCULAR | Status: AC
  Filled 2021-07-15: qty 30

## 2021-07-15 MED ORDER — ASPIRIN 81 MG PO CHEW: 81.0000 mg | CHEWABLE_TABLET | ORAL | Status: DC

## 2021-07-15 MED ORDER — HEPARIN (PORCINE) IN NACL 1000-0.9 UT/500ML-% IV SOLN
INTRAVENOUS | Status: DC | PRN
  Administered 2021-07-15 (×2): 500 mL

## 2021-07-15 MED ORDER — MIDAZOLAM HCL 2 MG/2ML IJ SOLN
INTRAMUSCULAR | Status: DC | PRN
  Administered 2021-07-15 (×2): 1 mg via INTRAVENOUS

## 2021-07-15 MED ORDER — VERAPAMIL HCL 2.5 MG/ML IV SOLN
INTRAVENOUS | Status: DC | PRN
  Administered 2021-07-15: 10 mL via INTRA_ARTERIAL

## 2021-07-15 MED ORDER — FENTANYL CITRATE (PF) 100 MCG/2ML IJ SOLN
INTRAMUSCULAR | Status: DC | PRN
  Administered 2021-07-15 (×2): 25 ug via INTRAVENOUS

## 2021-07-15 MED ORDER — LIDOCAINE HCL (PF) 1 % IJ SOLN
INTRAMUSCULAR | Status: DC | PRN
  Administered 2021-07-15: 2 mL via INTRADERMAL

## 2021-07-15 MED ORDER — FENTANYL CITRATE (PF) 100 MCG/2ML IJ SOLN
INTRAMUSCULAR | Status: AC
  Filled 2021-07-15: qty 2

## 2021-07-15 MED ORDER — HEPARIN (PORCINE) IN NACL 1000-0.9 UT/500ML-% IV SOLN
INTRAVENOUS | Status: AC
  Filled 2021-07-15: qty 1000

## 2021-07-15 MED ORDER — HEPARIN SODIUM (PORCINE) 1000 UNIT/ML IJ SOLN
INTRAMUSCULAR | Status: DC | PRN
  Administered 2021-07-15: 2500 [IU] via INTRAVENOUS
  Administered 2021-07-15: 6000 [IU] via INTRAVENOUS
  Administered 2021-07-15: 5000 [IU] via INTRAVENOUS

## 2021-07-15 MED ORDER — SODIUM CHLORIDE 0.9 % WEIGHT BASED INFUSION: 1.0000 mL/kg/h | INTRAVENOUS | Status: DC

## 2021-07-15 MED ORDER — CLOPIDOGREL BISULFATE 300 MG PO TABS
ORAL_TABLET | ORAL | Status: DC | PRN
  Administered 2021-07-15: 600 mg via ORAL

## 2021-07-15 MED ORDER — FAMOTIDINE IN NACL 20-0.9 MG/50ML-% IV SOLN
INTRAVENOUS | Status: DC | PRN
  Administered 2021-07-15: 20 mg via INTRAVENOUS

## 2021-07-15 MED ORDER — FAMOTIDINE IN NACL 20-0.9 MG/50ML-% IV SOLN
INTRAVENOUS | Status: AC
  Filled 2021-07-15: qty 50

## 2021-07-15 MED ORDER — IOHEXOL 350 MG/ML SOLN
INTRAVENOUS | Status: DC | PRN
  Administered 2021-07-15: 145 mL

## 2021-07-15 MED ORDER — NITROGLYCERIN 1 MG/10 ML FOR IR/CATH LAB
INTRA_ARTERIAL | Status: AC
  Filled 2021-07-15: qty 10

## 2021-07-15 MED ORDER — NITROGLYCERIN 1 MG/10 ML FOR IR/CATH LAB
INTRA_ARTERIAL | Status: DC | PRN
  Administered 2021-07-15: 200 ug via INTRACORONARY

## 2021-07-15 MED ORDER — SODIUM CHLORIDE 0.9 % WEIGHT BASED INFUSION
3.0000 mL/kg/h | INTRAVENOUS | Status: AC
  Administered 2021-07-15: 3 mL/kg/h via INTRAVENOUS

## 2021-07-15 MED ORDER — CLOPIDOGREL BISULFATE 300 MG PO TABS
ORAL_TABLET | ORAL | Status: AC
  Filled 2021-07-15: qty 2

## 2021-07-15 MED ORDER — CLOPIDOGREL BISULFATE 75 MG PO TABS
75.0000 mg | ORAL_TABLET | Freq: Every day | ORAL | 1 refills | Status: DC
  Filled 2021-07-15: qty 90, 90d supply, fill #0

## 2021-07-15 MED ORDER — MIDAZOLAM HCL 2 MG/2ML IJ SOLN
INTRAMUSCULAR | Status: AC
Start: 2021-07-15 — End: ?
  Filled 2021-07-15: qty 2

## 2021-07-15 MED ORDER — ASPIRIN 81 MG PO TBEC
81.0000 mg | DELAYED_RELEASE_TABLET | Freq: Every day | ORAL | 3 refills | Status: DC
  Filled 2021-07-15: qty 90, 90d supply, fill #0

## 2021-07-15 SURGICAL SUPPLY — 21 items
BALL SAPPHIRE NC24 3.25X12 (BALLOONS) ×2
BALLN SAPPHIRE 2.5X12 (BALLOONS) ×2
BALLOON SAPPHIRE 2.5X12 (BALLOONS) IMPLANT
BALLOON SAPPHIRE NC24 3.25X12 (BALLOONS) IMPLANT
BAND CMPR LRG ZPHR (HEMOSTASIS) ×1
BAND ZEPHYR COMPRESS 30 LONG (HEMOSTASIS) ×1 IMPLANT
CATH 5FR JL3.5 JR4 ANG PIG MP (CATHETERS) ×1 IMPLANT
CATH OPTICROSS HD (CATHETERS) ×1 IMPLANT
CATH VISTA GUIDE 6FR XBLAD3.5 (CATHETERS) ×1 IMPLANT
GLIDESHEATH SLEND A-KIT 6F 22G (SHEATH) ×1 IMPLANT
GUIDEWIRE INQWIRE 1.5J.035X260 (WIRE) IMPLANT
INQWIRE 1.5J .035X260CM (WIRE) ×2
KIT ENCORE 26 ADVANTAGE (KITS) ×1 IMPLANT
KIT HEART LEFT (KITS) ×2 IMPLANT
PACK CARDIAC CATHETERIZATION (CUSTOM PROCEDURE TRAY) ×2 IMPLANT
SHEATH PROBE COVER 6X72 (BAG) ×1 IMPLANT
SLED PULL BACK IVUS (MISCELLANEOUS) ×1 IMPLANT
STENT ONYX FRONTIER 2.75X18 (Permanent Stent) ×1 IMPLANT
TRANSDUCER W/STOPCOCK (MISCELLANEOUS) ×2 IMPLANT
TUBING CIL FLEX 10 FLL-RA (TUBING) ×2 IMPLANT
WIRE ASAHI PROWATER 180CM (WIRE) ×1 IMPLANT

## 2021-07-15 NOTE — CV Procedure (Signed)
Mid LAD 90% segmental stenosis beyond a region of eccentric calcified proximal 30 to 50% narrowing.  There is distal 30% LAD. Successful IVUS guided stent implantation of a 2.75 x 18 Onyx postdilated to 3.25 mmHg using a 12 mm long [place] balloon.  TIMI grade III flow with 0% residual stenosis. Distal 30% LAD narrowing Right coronary circumflex and left main did not contain significant blockage.   Circumflex is widely patent. Left main is widely patent. RCA is widely patent. Normal LV function with EF 55% and normal EDP  Plan discharge later this evening if no chest pain or bleeding symptoms.

## 2021-07-15 NOTE — Discharge Instructions (Signed)

## 2021-07-15 NOTE — Discharge Summary (Cosign Needed Addendum)
Discharge Summary for Same Day PCI   Patient ID: Brandon Madden MRN: [id number]; DOB: 05-04-1960  Admit date: 07/15/2021 Discharge date: 07/15/2021  Primary Care Provider: [place], Brandon G, MD  Primary Cardiologist: [name], MD  Primary Electrophysiologist:  Regan Lemming, MD   Discharge Diagnoses    Principal Problem:   CAD (coronary artery disease) Active Problems:   Obesity (BMI 30.0-34.9)   Hyperlipidemia LDL goal <70   PAF (paroxysmal atrial fibrillation) (HCC)   Diagnostic Studies/Procedures    Cardiac Catheterization 07/15/2021:  CONCLUSIONS: 95% mid LAD treated with IVUS guided stent implantation reducing stenosis to 0% with TIMI grade III flow using an 18 x 2.75 Onyx postdilated to 3.25 mm in diameter. The proximal to mid LAD contains 30% narrowing Left main is normal Circumflex is normal Right coronary is dominant and normal LV function is normal with LVEDP 15 mmHg.  EF 55%.   RECOMMENDATIONS:   Aspirin and Plavix x6 months.  After 6 months can de-escalate to monotherapy with either aspirin or Plavix. Aggressive risk factor modification. Outpatient status.   Diagnostic Dominance: Right Intervention   _____________   History of Present Illness     Brandon Madden is a 61 y.o. male with HLD, Paroxysmal Afib, and syncope.   He was evaluated by Dr. Shari Prows 06/22/2021 for chest pain.  His symptoms were somewhat atypical for ischemia.  However, given his strong family history of CAD and prior history of syncope, coronary CTA was arranged.  This demonstrated possible severe mid LAD stenosis.  FFR demonstrated hemodynamically significant stenosis in the mid LAD (0.60).  He was seen back in the office with Brandon Madden to discuss cardiac cath.    Hospital Course     The patient underwent cardiac cath as noted above with 95% mLAD treated with PCI/DES x1. Plan for DAPT with ASA/plavix for at least 6 months. The patient was seen by cardiac rehab while in  short stay. There were no observed complications post cath. Radial cath site was re-evaluated prior to discharge and found to be stable without any complications. Instructions/precautions regarding cath site care were given prior to discharge.  Brandon Madden was seen by Dr. Katrinka Blazing and determined stable for discharge home. Follow up with our office has been arranged. Medications are listed below. Pertinent changes include addition of plavix.  _____________  Cath/PCI Registry Performance & Quality Measures: Aspirin prescribed? - Yes ADP Receptor Inhibitor (Plavix/Clopidogrel, Brilinta/Ticagrelor or Effient/Prasugrel) prescribed (includes medically managed patients)? - Yes High Intensity Statin (Lipitor 40-80mg  or Crestor 20-40mg ) prescribed? - Yes For EF <40%, was ACEI/ARB prescribed? - Not Applicable (EF >/= 40%) For EF <40%, Aldosterone Antagonist (Spironolactone or Eplerenone) prescribed? - Not Applicable (EF >/= 40%) Cardiac Rehab Phase II ordered (Included Medically managed Patients)? - Yes  _____________   Discharge Vitals Blood pressure 97/73, pulse 65, temperature 98.5 F (36.9 C), temperature source Oral, resp. rate 18, height 5\' 6"  (1.676 m), weight 90.7 kg, SpO2 95 %.  Filed Weights   07/15/21 1057  Weight: 90.7 kg    Last Labs & Radiologic Studies    CBC Recent Labs    07/13/21 1550  WBC 6.8  HGB 17.5  HCT 50.2  MCV 93  PLT 196   Basic Metabolic Panel Recent Labs    07/15/21 1550  NA 140  K 4.4  CL 104  CO2 23  GLUCOSE 94  BUN 19  CREATININE 0.96  CALCIUM 9.2   Liver Function Tests No results for input(s): AST, ALT,  ALKPHOS, BILITOT, PROT, ALBUMIN in the last 72 hours. No results for input(s): LIPASE, AMYLASE in the last 72 hours. High Sensitivity Troponin:   No results for input(s): TROPONINIHS in the last 720 hours.  BNP Invalid input(s): POCBNP D-Dimer No results for input(s): DDIMER in the last 72 hours. Hemoglobin A1C No results for input(s):  HGBA1C in the last 72 hours. Fasting Lipid Panel No results for input(s): CHOL, HDL, LDLCALC, TRIG, CHOLHDL, LDLDIRECT in the last 72 hours. Thyroid Function Tests No results for input(s): TSH, T4TOTAL, T3FREE, THYROIDAB in the last 72 hours.  Invalid input(s): FREET3 _____________  CARDIAC CATHETERIZATION  Result Date: 07/15/2021 CONCLUSIONS: 95% mid LAD treated with IVUS guided stent implantation reducing stenosis to 0% with TIMI grade III flow using an 18 x 2.75 Onyx postdilated to 3.25 mm in diameter. The proximal to mid LAD contains 30% narrowing Left main is normal Circumflex is normal Right coronary is dominant and normal LV function is normal with LVEDP 15 mmHg.  EF 55%. RECOMMENDATIONS: Aspirin and Plavix x6 months.  After 6 months can de-escalate to monotherapy with either aspirin or Plavix. Aggressive risk factor modification. Outpatient status.   CT CORONARY MORPH W/CTA COR W/SCORE W/CA W/CM &/OR WO/CM  Addendum Date: 07/11/2021   ADDENDUM REPORT: 07/11/2021 13:59 CLINICAL DATA:  Chest pain EXAM: Cardiac/Coronary CTA TECHNIQUE: A non-contrast, gated CT scan was obtained with axial slices of 3 mm through the heart for calcium scoring. Calcium scoring was performed using the Agatston method. A 110 kV prospective, gated, contrast cardiac scan was obtained. Gantry rotation speed was 250 msecs and collimation was 0.6 mm. Two sublingual nitroglycerin tablets (0.8 mg) were given. The 3D data set was reconstructed in 5% intervals of the 35-75% of the R-R cycle. Diastolic phases were analyzed on a dedicated workstation using MPR, MIP, and VRT modes. The patient received 95 cc of contrast. FINDINGS: Image quality: Excellent. Noise artifact is: Limited. Coronary Arteries:  Normal coronary origin.  Right dominance. Left main: The left main is a large caliber vessel with a normal take off from the left coronary cusp that trifurcates into a LAD, LCX, and ramus intermedius. There is no plaque or stenosis.  Left anterior descending artery: The proximal LAD contains mild calcified plaque (25-49%). There is a severe non-calcified plaque (70-99%) in the mid LAD. The distal LAD contains minimal non-calcified plaque (<25%). The first diagonal contains minimal calcified plaque (<25%). The second diagonal is patent. Ramus intermedius: Patent with no evidence of plaque or stenosis. Left circumflex artery: The LCX is non-dominant and patent with no evidence of plaque or stenosis. The LCX gives off 1 obtuse marginal branch with mild mixed density plaque (25-49%). Right coronary artery: The RCA is dominant with normal take off from the right coronary cusp. There is minimal calcified plaque (<25%) in the proximal segment. The mid and distal segments are patent. The RCA terminates as a PDA and right posterolateral branch without evidence of plaque or stenosis. Right Atrium: Right atrial size is within normal limits. Right Ventricle: The right ventricular cavity is within normal limits. Left Atrium: Left atrial size is normal in size with no left atrial appendage filling defect. Left Ventricle: The ventricular cavity size is within normal limits. Pulmonary arteries: Normal in size without proximal filling defect. Pulmonary veins: Normal pulmonary venous drainage. Pericardium: Normal thickness without significant effusion or calcium present. Cardiac valves: The aortic valve is trileaflet without significant calcification. The mitral valve is normal without significant calcification. Aorta: Normal caliber without significant  disease. Extra-cardiac findings: See attached radiology report for non-cardiac structures. IMPRESSION: 1. Coronary calcium score of 260. This was 84th percentile for age-, sex, and race-matched controls. 2. Normal coronary origin with right dominance. 3.  Severe mid LAD stenosis (70-99%). 4.  Mild mixed density plaque (25-49%) in the LCX. 5.  Minimal plaque in the RCA (<25%). RECOMMENDATIONS: 1. Severe stenosis in  the mid LAD. CT FFR will be submitted. Consider symptom-guided anti-ischemic pharmacotherapy as well as risk factor modification per guideline directed care. Invasive coronary angiography recommended with revascularization per published guideline statements. Lennie Odor, MD Electronically Signed   By: Lennie Odor M.D.   On: 07/11/2021 13:59   Result Date: 07/11/2021 EXAM: OVER-READ INTERPRETATION  CT CHEST The following report is a limited chest CT over-read performed by radiologist Dr. Jeronimo Greaves of [facility] on 07/11/2021. This over-read does not include interpretation of cardiac or coronary anatomy or pathology. The cardiac CTA interpretation by the cardiologist is attached. COMPARISON:  06/04/2020 CTA chest FINDINGS: Vascular: Normal aortic caliber. No central pulmonary embolism, on this non-dedicated study. Mediastinum/Nodes: No imaged thoracic adenopathy. Lungs/Pleura: No pleural fluid.  Clear imaged lungs. Upper Abdomen: Mild hepatic steatosis. Normal imaged portions of the spleen, stomach. Musculoskeletal: No acute osseous abnormality. IMPRESSION: No acute findings in the imaged extracardiac chest. Mild hepatic steatosis. Electronically Signed: By: Jeronimo Greaves M.D. On: 07/11/2021 13:08   CT CORONARY FRACTIONAL FLOW RESERVE DATA PREP  Result Date: 07/11/2021 EXAM: CT FFR analysis was performed on the original cardiac CTA dataset. Diagrammatic representation of the CT FFR analysis is provided in a separate PDF document in PACS. This dictation was created using the PDF document and an interactive 3D model of the results. The 3D model is not available in the EMR/PACS. INTERPRETATION: CT FFR provides simultaneous calculation of pressure and flow across the entire coronary tree. For clinical decision making, CT FFR values should be obtained 1-2 cm distal to the lower border of each stenosis measured. Coronary CTA-related artifacts may impair the diagnostic accuracy of the original  cardiac CTA and FFR CT results. *Due to the fact that CT FFR represents a mathematically-derived analysis, it is recommended that the results be interpreted as follows: 1. CT FFR >0.80: Low likelihood of hemodynamic significance. 2. CT FFR 0.76-0.80: Borderline likelihood of hemodynamic significance. 3. CT FFR =< 0.75: High likelihood of hemodynamic significance. *Coronary CT Angiography-derived Fractional Flow Reserve Testing in Patients with Stable Coronary Artery Disease: Recommendations on Interpretation and Reporting. Radiology: Cardiothoracic Imaging. 2019;1(5):e190050 FINDINGS: 1. Left Main: 0.99; low likelihood of hemodynamic significance. 2. Prox LAD: 0.96; low likelihood of hemodynamic significance. 3. Mid LAD: 0.60; high likelihood of hemodynamic significance. 4. LCX: 0.92; low likelihood of hemodynamic significance. 5. RCA: 0.93; low likelihood of hemodynamic significance. IMPRESSION: 1.  Mid LAD is positive by CT FFR (0.60). Lennie Odor, MD Electronically Signed   By: Lennie Odor M.D.   On: 07/11/2021 14:09   CUP PACEART REMOTE DEVICE CHECK  Result Date: 06/16/2021 ILR summary report received. Battery status OK. Normal device function. No new symptom, tachy, brady, or pause episodes. No new AF episodes. AF burden is 0% of the time.  Monthly summary reports and ROV/PRN Hassell Halim, RN, CCDS, [employer]   Disposition   Pt is being discharged home today in good condition.  Follow-up Plans & Appointments     Follow-up Information     Beatrice Lecher, PA-C Follow up on 07/29/2021.   Specialties: Cardiology, Physician Assistant Why:  at 10am for your follow up appt Contact information: [address] [place] [address] [phone]                Discharge Instructions     Amb Referral to Cardiac Rehabilitation   Complete by: As directed    Diagnosis: Coronary Stents   After initial evaluation and assessments completed: Virtual Based Care  may be provided alone or in conjunction with Phase 2 Cardiac Rehab based on patient barriers.: Yes        Discharge Medications   Allergies as of 07/15/2021       Reactions   Bee Venom Anaphylaxis        Medication List     TAKE these medications    aspirin EC 81 MG tablet Take 1 tablet (81 mg total) by mouth daily. What changed: additional instructions   clopidogrel 75 MG tablet Commonly known as: Plavix Take 1 tablet (75 mg total) by mouth daily.   diltiazem 30 MG tablet Commonly known as: Cardizem Take 1 tablet (30 mg total) by mouth 4 (four) times daily as needed.   Lubricant Eye Drops 0.4-0.3 % Soln Generic drug: Polyethyl Glycol-Propyl Glycol Place 1-2 drops into both eyes 3 (three) times daily as needed (dry/irritated eyes.).   metoprolol succinate 25 MG 24 hr tablet Commonly known as: Toprol XL Take 1 tablet (25 mg total) by mouth daily.   Multi For Him Tabs Take 2 tablets by mouth in the morning.   nitroGLYCERIN 0.4 MG SL tablet Commonly known as: NITROSTAT Place 1 tablet (0.4 mg total) under the tongue every 5 (five) minutes as needed for chest pain.   rosuvastatin 20 MG tablet Commonly known as: CRESTOR Take 2 tablets (40 mg total) by mouth daily.         Allergies Allergies  Allergen Reactions   Bee Venom Anaphylaxis    Outstanding Labs/Studies   N/a   Duration of Discharge Encounter   Greater than 30 minutes including physician time.  Signed, Laverda PageLindsay Celita Aron, NP 07/15/2021, 4:18 PM

## 2021-07-15 NOTE — Interval H&P Note (Signed)
Cath Lab Visit (complete for each Cath Lab visit)  Clinical Evaluation Leading to the Procedure:   ACS: No.  Non-ACS:    Anginal Classification: No Symptoms  Anti-ischemic medical therapy: Minimal Therapy (1 class of medications)  Non-Invasive Test Results: High-risk stress test findings: cardiac mortality >3%/year  Prior CABG: No previous CABG      History and Physical Interval Note:  07/15/2021 12:01 PM  Brandon Madden  has presented today for surgery, with the diagnosis of abnormal cardiac ct.  The various methods of treatment have been discussed with the patient and family. After consideration of risks, benefits and other options for treatment, the patient has consented to  Procedure(s): LEFT HEART CATH AND CORONARY ANGIOGRAPHY (N/A) as a surgical intervention.  The patient's history has been reviewed, patient examined, no change in status, stable for surgery.  I have reviewed the patient's chart and labs.  Questions were answered to the patient's satisfaction.     Lyn Records III

## 2021-07-15 NOTE — Progress Notes (Signed)
Seen pt from 1500-1520, gave pt procedure, site care, restrictions, ex guidelines, diet CRPII education, Asprin, Plavix, NTG education. All pt questions were answered prior to leaving.

## 2021-07-19 ENCOUNTER — Encounter (HOSPITAL_COMMUNITY): Payer: Self-pay | Admitting: Interventional Cardiology

## 2021-07-19 ENCOUNTER — Ambulatory Visit (INDEPENDENT_AMBULATORY_CARE_PROVIDER_SITE_OTHER): Payer: BC Managed Care – PPO

## 2021-07-19 DIAGNOSIS — R55 Syncope and collapse: Secondary | ICD-10-CM

## 2021-07-19 NOTE — Progress Notes (Signed)
Pt has been made aware of normal result and verbalized understanding.  jw

## 2021-07-20 LAB — CUP PACEART REMOTE DEVICE CHECK
Date Time Interrogation Session: 20230531084140
Implantable Pulse Generator Implant Date: 20220517

## 2021-07-28 ENCOUNTER — Telehealth (HOSPITAL_COMMUNITY): Payer: Self-pay

## 2021-07-28 NOTE — Telephone Encounter (Signed)
Called patient to see if he is interested in the Cardiac Rehab Program. Patient expressed interest. Explained scheduling process, patient verbalized understanding. Will contact patient for scheduling once f/u has been completed.  

## 2021-07-29 ENCOUNTER — Ambulatory Visit: Payer: BC Managed Care – PPO | Admitting: Physician Assistant

## 2021-07-29 ENCOUNTER — Telehealth (HOSPITAL_COMMUNITY): Payer: Self-pay

## 2021-07-29 NOTE — Telephone Encounter (Signed)
Pt insurance is active and benefits verified through [employer]. Co-pay $40.00, DED $0.00/$0.00 met, out of pocket $1,500.00/$1,500.00 met, co-insurance 0%. No pre-authorization required. Emme P./[employer], 07/29/21 @ 10:09AM, UDT#[id number]   How many CR sessions are covered? (36 sessions for TCR, 72 sessions for ICR)72 Is this a lifetime maximum or an annual maximum? No Has the member used any of these services to date? 0 Is there a time limit (weeks/months) on start of program and/or program completion? No    Went over insurance, patient verbalized understanding.    Will contact patient to see if he is interested in the Cardiac Rehab Program. If interested, patient will need to complete follow up appt. Once completed, patient will be contacted for scheduling upon review by the RN Navigator.

## 2021-08-04 NOTE — Progress Notes (Signed)
Cardiology Office Note:    Date:  08/05/2021   ID:  Brandon Madden, DOB 1960-05-16, MRN [id number]  PCP:  Martinique, Betty G, MD  [place] Prov[facility] Cardiologist:  Freada Bergeron, MD Electrophysiologist:  Constance Haw, MD     Referring MD: Martinique, Betty G, MD   Chief Complaint:  Hospitalization Follow-up (Status post PCI)    Patient Profile: Coronary artery disease S/p DES to the mid LAD in 06/2021 Hyperlipidemia  Congenital deafness Syncope  S/p ILR Paroxysmal atrial fibrillation  CHA2DS2-VASc Score = 1 [CHF History: 0, HTN History: 0, Diabetes History: 0, Stroke History: 0, Vascular Disease History: 1, Age Score: 0, Gender Score: 0] >> No anticoagulation  Low testosterone on replacement  OSA  Prior CV Studies: LEFT HEART CATH AND CORONARY ANGIOGRAPHY 07/15/2021 LAD mid 30, 95 Left main is normal Circumflex is normal Right coronary is dominant and normal LV function is normal with LVEDP 15 mmHg.  EF 55%. PCI:  18 x 2.75 mm Onyx DES to the mid LAD  RECOMMENDATIONS: Aspirin and Plavix x6 months.   After 6 months can de-escalate to monotherapy with either aspirin or Plavix.    Coronary CTA 07/11/2021 IMPRESSION: 1. Coronary calcium score of 260. This was 84th percentile for age-, sex, and race-matched controls. 2. Normal coronary origin with right dominance. 3.  Severe mid LAD stenosis (70-99%). 4.  Mild mixed density plaque (25-49%) in the LCX.  5.  Minimal plaque in the RCA (<25%).   FINDINGS: 1. Left Main: 0.99; low likelihood of hemodynamic significance.  2. Prox LAD: 0.96; low likelihood of hemodynamic significance. 3. Mid LAD: 0.60; high likelihood of hemodynamic significance. 4. LCX: 0.92; low likelihood of hemodynamic significance. 5. RCA: 0.93; low likelihood of hemodynamic significance.   IMPRESSION:  1.  Mid LAD is positive by CT FFR (0.60).   Echocardiogram 06/03/2020 EF 55-60, no RWMA, GR 1 DD, normal RVSF, trivial MR, trivial  AI  History of Present Illness:   Brandon Madden is a 61 y.o. male with the above problem list.  He was recently set up for cardiac catheterization due to symptoms of exertional angina and an abnormal coronary CTA suggesting hemodynamically significant stenosis in the mid LAD.   Cardiac catheterization performed 07/15/2021 demonstrated a 95% mid LAD stenosis which was treated with an Onyx DES.  He was discharged later that day.  Post PCI course was uncomplicated.  He returns for follow-up.  He is here alone.  He has not had chest pain.  He has noted some mild shortness of breath at times but no significant dyspnea with exertion.  He did go to a water park recently and after coming back to the hotel room, he did have a near syncopal spell.  He has not had recurrent syncope.  He did have an episode of tachypalpitations recently.  He activated his loop recorder.  I did check with our device clinic and he was in sinus rhythm at this time.  He is eager to be proactive about preventing further vascular disease.  We discussed the importance of risk factor modification.  He is interested in cardiac rehabilitation.    Past Medical History:  Diagnosis Date   CAD (coronary artery disease) 07/13/2021   CCTA 06/2021: CAC score 260 (84th percentile); mLAD 70-99; mLAD FFR pos (0.60) Status post DES to the mid LAD 06/2021   Deafness in right ear    Family history of pancreatic cancer    Family history of prostate cancer  Family history of skin cancer    Hyperlipidemia LDL goal <70 08/20/2018   PAF (paroxysmal atrial fibrillation) ([place]) 05/06/2021   Sleep apnea    Current Medications: Current Meds  Medication Sig   aspirin EC 81 MG tablet Take 1 tablet (81 mg total) by mouth daily.   clopidogrel (PLAVIX) 75 MG tablet Take 1 tablet (75 mg total) by mouth daily.   diltiazem (CARDIZEM) 30 MG tablet Take 1 tablet (30 mg total) by mouth 4 (four) times daily as needed.   Multiple Vitamins-Minerals (MULTI FOR HIM) TABS  Take 1 tablet by mouth in the morning.   nitroGLYCERIN (NITROSTAT) 0.4 MG SL tablet Place 1 tablet (0.4 mg total) under the tongue every 5 (five) minutes as needed for chest pain.   Polyethyl Glycol-Propyl Glycol (LUBRICANT EYE DROPS) 0.4-0.3 % SOLN Place 1-2 drops into both eyes 3 (three) times daily as needed (dry/irritated eyes.).   rosuvastatin (CRESTOR) 20 MG tablet Take 2 tablets (40 mg total) by mouth daily.   [DISCONTINUED] metoprolol succinate (TOPROL XL) 25 MG 24 hr tablet Take 1 tablet (25 mg total) by mouth daily.    Allergies:   Bee venom   Social History   Tobacco Use   Smoking status: Never   Smokeless tobacco: Never  Substance Use Topics   Alcohol use: No    Comment: quit: 2011   Drug use: No    Family Hx: The patient's family history includes Heart attack in his father; Pancreatic cancer in his maternal grandmother; Prostate cancer in his brother and brother; Skin cancer in his sister.  Review of Systems  Gastrointestinal:  Negative for hematochezia.  Genitourinary:  Negative for hematuria.     EKGs/Labs/Other Test Reviewed:    EKG:  EKG is  ordered today.  The ekg ordered today demonstrates NSR, HR 71, normal axis, no ST-T wave changes, QTc 406  Recent Labs: 07/13/2021: BUN 19; Creatinine, Ser 0.96; Hemoglobin 17.5; Platelets 196; Potassium 4.4; Sodium 140   Recent Lipid Panel Recent Labs    09/30/20 0809  CHOL 171  TRIG 83  HDL 45  LDLCALC 110*     Risk Assessment/Calculations/Metrics:              Physical Exam:    VS:  BP 100/78   Pulse 71   Ht 5' 6"  (1.676 m)   Wt 200 lb 9.6 oz (91 kg)   SpO2 94%   BMI 32.38 kg/m     Wt Readings from Last 3 Encounters:  08/05/21 200 lb 9.6 oz (91 kg)  07/15/21 200 lb (90.7 kg)  07/13/21 205 lb (93 kg)    Constitutional:      Appearance: Healthy appearance. Not in distress.  Neck:     Vascular: Carotid bruit (right\) present. JVD normal.  Pulmonary:     Effort: Pulmonary effort is normal.      Breath sounds: No wheezing. No rales.  Cardiovascular:     Normal rate. Regular rhythm. Normal S1. Normal S2.      Murmurs: There is no murmur.     Comments: Right wrist without hematoma Edema:    Peripheral edema absent.  Abdominal:     Palpations: Abdomen is soft.  Skin:    General: Skin is warm and dry.  Neurological:     General: No focal deficit present.     Mental Status: Alert and oriented to person, place and time.         ASSESSMENT & PLAN:   CAD (  coronary artery disease) Status post DES to the mid LAD.  He is doing well without significant symptoms to suggest angina.  I encouraged him to pursue cardiac rehabilitation.  We also discussed the importance of risk factor modification to include not smoking, adequate diet and exercise, maintaining low cholesterol levels, maintaining good blood pressure.  We discussed the importance of dual antiplatelet therapy for minimum of 6 months post PCI.  Continue aspirin 80 mg daily, clopidogrel 75 mg daily, rosuvastatin 40 mg daily.  He does not have a history of high blood pressure.  We placed him on metoprolol succinate prior to his cath as an antianginal.  He had a near syncopal spell recently.  Of asked him to cut his metoprolol succinate in half for 2 days and then discontinue.  Follow-up in 3 months.  Hyperlipidemia LDL goal <70 Continue rosuvastatin 40 mg daily.  Arrange follow-up CMET, lipids in 2 months.  PAF (paroxysmal atrial fibrillation) (HCC) Maintaining sinus rhythm.  Recent tachycardia event with sinus rhythm noted on loop recorder.  He is not on anticoagulation given low stroke risk.  Continue diltiazem 30 mg as needed.  Bruit of right carotid artery He is concerned about carotid artery disease.  He does have a suggestion of a bruit on the right.  I will obtain carotid ultrasound.       Cardiac Rehabilitation Eligibility Assessment  The patient is ready to start cardiac rehabilitation from a cardiac standpoint.          Dispo:  Return in about 3 months (around 11/05/2021) for Routine follow up in 3 months with Dr.Pemberton.   Medication Adjustments/Labs and Tests Ordered: Current medicines are reviewed at length with the patient today.  Concerns regarding medicines are outlined above.  Tests Ordered: Orders Placed This Encounter  Procedures   Comp Met (CMET)   Lipid Profile   EKG 12-Lead   VAS US CAROTID   Medication Changes: Meds ordered this encounter  Medications   metoprolol succinate (TOPROL XL) 25 MG 24 hr tablet    Sig: Take 0.5 tablets (12.5 mg total) by mouth daily. X 2 days than discontinue on June 19,2023.    Dispense:  90 tablet    Refill:  3   Signed, Richardson Dopp, PA-C  08/05/2021 1:46 PM    [facility] [place], [place], [place]  [address] Phone: [phone]; Fax: [phone]

## 2021-08-04 NOTE — Progress Notes (Signed)
Carelink Summary Report / Loop Recorder 

## 2021-08-05 ENCOUNTER — Encounter: Payer: Self-pay | Admitting: Physician Assistant

## 2021-08-05 ENCOUNTER — Ambulatory Visit: Payer: BC Managed Care – PPO | Admitting: Physician Assistant

## 2021-08-05 VITALS — BP 100/78 | HR 71 | Ht 66.0 in | Wt 200.6 lb

## 2021-08-05 DIAGNOSIS — I2510 Atherosclerotic heart disease of native coronary artery without angina pectoris: Secondary | ICD-10-CM

## 2021-08-05 DIAGNOSIS — E785 Hyperlipidemia, unspecified: Secondary | ICD-10-CM | POA: Diagnosis not present

## 2021-08-05 DIAGNOSIS — I480 Paroxysmal atrial fibrillation: Secondary | ICD-10-CM

## 2021-08-05 DIAGNOSIS — R0989 Other specified symptoms and signs involving the circulatory and respiratory systems: Secondary | ICD-10-CM | POA: Insufficient documentation

## 2021-08-05 MED ORDER — METOPROLOL SUCCINATE ER 25 MG PO TB24: 12.5000 mg | ORAL_TABLET | Freq: Every day | ORAL | 3 refills | Status: DC

## 2021-08-05 NOTE — Assessment & Plan Note (Signed)
He is concerned about carotid artery disease.  He does have a suggestion of a bruit on the right.  I will obtain carotid ultrasound.

## 2021-08-05 NOTE — Assessment & Plan Note (Signed)
Status post DES to the mid LAD.  He is doing well without significant symptoms to suggest angina.  I encouraged him to pursue cardiac rehabilitation.  We also discussed the importance of risk factor modification to include not smoking, adequate diet and exercise, maintaining low cholesterol levels, maintaining good blood pressure.  We discussed the importance of dual antiplatelet therapy for minimum of 6 months post PCI.  Continue aspirin 80 mg daily, clopidogrel 75 mg daily, rosuvastatin 40 mg daily.  He does not have a history of high blood pressure.  We placed him on metoprolol succinate prior to his cath as an antianginal.  He had a near syncopal spell recently.  Of asked him to cut his metoprolol succinate in half for 2 days and then discontinue.  Follow-up in 3 months.

## 2021-08-05 NOTE — Assessment & Plan Note (Signed)
Maintaining sinus rhythm.  Recent tachycardia event with sinus rhythm noted on loop recorder.  He is not on anticoagulation given low stroke risk.  Continue diltiazem 30 mg as needed.

## 2021-08-05 NOTE — Patient Instructions (Signed)
Medication Instructions:   DECREASE Toprol one half tablet ( 12.5 mg) X 2 days than discontinue.  *If you need a refill on your cardiac medications before your next appointment, please call your pharmacy*   Lab Work:  Your physician recommends that you return for a FASTING lipid profile/CMET. Thursday, August 10. You can come in on the day of your appointment anytime between 7:30-4:30 fasting from midnight the night before.    If you have labs (blood work) drawn today and your tests are completely normal, you will receive your results only by: MyChart Message (if you have MyChart) OR A paper copy in the mail If you have any lab test that is abnormal or we need to change your treatment, we will call you to review the results.   Testing/Procedures:  Your physician has requested that you have a carotid duplex. This test is an ultrasound of the carotid arteries in your neck. It looks at blood flow through these arteries that supply the brain with blood. Allow one hour for this exam. There are no restrictions or special instructions.   Follow-Up: At [facility], you and your health needs are our priority.  As part of our continuing mission to provide you with exceptional heart care, we have created designated Provider Care Teams.  These Care Teams include your primary Cardiologist (physician) and Advanced Practice Providers (APPs -  Physician Assistants and Nurse Practitioners) who all work together to provide you with the care you need, when you need it.  We recommend signing up for the patient portal called "MyChart".  Sign up information is provided on this After Visit Summary.  MyChart is used to connect with patients for Virtual Visits (Telemedicine).  Patients are able to view lab/test results, encounter notes, upcoming appointments, etc.  Non-urgent messages can be sent to your provider as well.   To learn more about what you can do with MyChart, go to [email or web address].     Your next appointment:   3 month(s)  The format for your next appointment:   In Person  Provider:   [name], MD     Important Information About Sugar

## 2021-08-05 NOTE — Assessment & Plan Note (Signed)
Continue rosuvastatin 40 mg daily.  Arrange follow-up CMET, lipids in 2 months.

## 2021-08-08 ENCOUNTER — Encounter (HOSPITAL_COMMUNITY): Payer: BC Managed Care – PPO

## 2021-08-08 ENCOUNTER — Other Ambulatory Visit: Payer: Self-pay | Admitting: Physician Assistant

## 2021-08-08 DIAGNOSIS — R0989 Other specified symptoms and signs involving the circulatory and respiratory systems: Secondary | ICD-10-CM

## 2021-08-10 ENCOUNTER — Ambulatory Visit (HOSPITAL_COMMUNITY)
Admission: RE | Admit: 2021-08-10 | Discharge: 2021-08-10 | Disposition: A | Discharge status: Home / Self Care | Payer: BC Managed Care – PPO | Source: Ambulatory Visit | Attending: Cardiovascular Disease | Admitting: Cardiovascular Disease

## 2021-08-10 DIAGNOSIS — R0989 Other specified symptoms and signs involving the circulatory and respiratory systems: Secondary | ICD-10-CM | POA: Diagnosis not present

## 2021-08-11 ENCOUNTER — Encounter: Payer: Self-pay | Admitting: Physician Assistant

## 2021-08-11 DIAGNOSIS — I6529 Occlusion and stenosis of unspecified carotid artery: Secondary | ICD-10-CM | POA: Insufficient documentation

## 2021-08-11 HISTORY — DX: Occlusion and stenosis of unspecified carotid artery: I65.29

## 2021-08-18 ENCOUNTER — Ambulatory Visit (INDEPENDENT_AMBULATORY_CARE_PROVIDER_SITE_OTHER): Payer: BC Managed Care – PPO

## 2021-08-18 DIAGNOSIS — R55 Syncope and collapse: Secondary | ICD-10-CM

## 2021-08-21 LAB — CUP PACEART REMOTE DEVICE CHECK
Date Time Interrogation Session: 20230701230428
Implantable Pulse Generator Implant Date: 20220517

## 2021-08-29 ENCOUNTER — Ambulatory Visit: Payer: BC Managed Care – PPO

## 2021-08-29 DIAGNOSIS — G4733 Obstructive sleep apnea (adult) (pediatric): Secondary | ICD-10-CM | POA: Diagnosis not present

## 2021-08-29 DIAGNOSIS — Z9989 Dependence on other enabling machines and devices: Secondary | ICD-10-CM

## 2021-08-30 ENCOUNTER — Telehealth: Payer: Self-pay | Admitting: Cardiology

## 2021-08-30 NOTE — Telephone Encounter (Signed)
Secure message sent to Dr. Elberta Fortis who is working in the hospital today for recommendations.  ILR alert alert received for pause on 08/29/21 at 1041, 12 sec (HB) with no correlated symptom event. 1 new symptom event on same at 1439 reported "passed out for 10-20 sec.

## 2021-08-30 NOTE — Telephone Encounter (Signed)
Pt sent this message  via Mychart to the scheduling pool:     I also had the aura typ issue about 10'days ago, but I did not pass out.  Yes,  it happened today at around 10:30 in my office at work.  Symptoms resembled incident in April 2022 that preceded  my seeing Dr Shari Prows.  It has not happened again until today.  Similar symptoms- an aura, lightheaded, unstable and then I was out.  I was by myself, seated and came to with my phone on the floor at my feet, which cued me that I had passed out . I now have a low grade headache.  My heart rate was up a little bit, just beforehand.  [name],   Can you tell me a little more about your passing out    Did you pass out today?    When is the last time you passed out?    Has this occurred multiple times?    Did you have any symptoms prior to passing out?      Appointment Request From: [name]  With Provider: [name], MD [facility] [address] Office]  Preferred Date Range: 08/30/2021 - 09/09/2021  Preferred Times: Any Time  Reason for visit: Office Visit  Comments: Fainted today with similar aura symptoms to prior events

## 2021-08-30 NOTE — Telephone Encounter (Signed)
Spoke to Dr. Elberta Fortis and would like to see patient in office 08/31/21 @ 1:45 pm to discuss pacemaker. Patient called made aware and apt confirmed with patient including location, date and time.

## 2021-08-30 NOTE — Telephone Encounter (Signed)
Pt called to report that he had a syncopal episode yesterday morning lasting about 15 sec with full loss of consciousness... he says it is just like the episode he had 05/2021 that he saw Dr Jacquelyne Balint for... he says he had an "aura" come over him prior to the episode.. he was sitting at his desk with some stress working to meet some time sensitive work deadliness.. he was sitting in his office chair.. he woke up and found his phone had fallen to the floor.   He denies chest pain, headache, palpitations.   He is working form home today and would like to let Dr Shari Prows know about the episode and if she could possibly see him.   I advised him not to drive any where and I will talk with Dr Shari Prows and let him know her recommendations.   Pt says he sent a transmission for his Loop recorder and I will also send to the device clinic to review.

## 2021-08-31 ENCOUNTER — Encounter: Payer: Self-pay | Admitting: Cardiology

## 2021-08-31 ENCOUNTER — Encounter: Payer: Self-pay | Admitting: *Deleted

## 2021-08-31 ENCOUNTER — Ambulatory Visit (INDEPENDENT_AMBULATORY_CARE_PROVIDER_SITE_OTHER): Payer: BC Managed Care – PPO | Admitting: Cardiology

## 2021-08-31 VITALS — BP 110/76 | HR 86 | Ht 66.0 in | Wt 194.8 lb

## 2021-08-31 DIAGNOSIS — I480 Paroxysmal atrial fibrillation: Secondary | ICD-10-CM | POA: Diagnosis not present

## 2021-08-31 DIAGNOSIS — Z01812 Encounter for preprocedural laboratory examination: Secondary | ICD-10-CM

## 2021-08-31 DIAGNOSIS — R55 Syncope and collapse: Secondary | ICD-10-CM | POA: Diagnosis not present

## 2021-08-31 LAB — CBC
Hematocrit: 47.7 % (ref 37.5–51.0)
Hemoglobin: 16.7 g/dL (ref 13.0–17.7)
MCH: 32.5 pg (ref 26.6–33.0)
MCHC: 35 g/dL (ref 31.5–35.7)
MCV: 93 fL (ref 79–97)
Platelets: 179 10*3/uL (ref 150–450)
RBC: 5.14 x10E6/uL (ref 4.14–5.80)
RDW: 14.2 % (ref 11.6–15.4)
WBC: 5.5 10*3/uL (ref 3.4–10.8)

## 2021-08-31 LAB — BASIC METABOLIC PANEL
BUN/Creatinine Ratio: 17 (ref 9–20)
BUN: 16 mg/dL (ref 6–24)
CO2: 26 mmol/L (ref 20–29)
Calcium: 9.8 mg/dL (ref 8.7–10.2)
Chloride: 104 mmol/L (ref 96–106)
Creatinine, Ser: 0.94 mg/dL (ref 0.76–1.27)
Glucose: 101 mg/dL — ABNORMAL HIGH (ref 70–99)
Potassium: 4.2 mmol/L (ref 3.5–5.2)
Sodium: 139 mmol/L (ref 134–144)
eGFR: 93 mL/min/{1.73_m2} (ref >59)

## 2021-08-31 NOTE — H&P (View-Only) (Signed)
Electrophysiology Office Note   Date:  08/31/2021   ID:  Peggy Monk, DOB 05/10/1960, MRN [id number]  PCP:  [place], Betty G, MD  Cardiologist: Shari Prows Primary Electrophysiologist:  Shameer Molstad Jorja Loa, MD    Chief Complaint: Syncope   History of Present Illness: Brandon Madden is a 61 y.o. male who is being seen today for the evaluation of syncope at the request of [place], Timoteo Expose, MD. Presenting today for electrophysiology evaluation.  He has a history seen for hypertension COVID-19 infection.  He had an episode of syncope.  He was sitting in a restaurant and suddenly felt like he was going into a hole.  He had dizziness and profound fatigue.  He lost consciousness for 20 to 25 seconds before returning to baseline.  The day before, he completed an 11 mile bike ride.  He had had several similar episodes but did not lose consciousness.  He had a normal neurologic work-up.  He is status post Linq monitor implant.  Linq monitor showed a 22-hour episode of atrial fibrillation.  Today, denies symptoms of palpitations, chest pain, shortness of breath, orthopnea, PND, lower extremity edema, claudication, dizziness, presyncope, bleeding, or neurologic sequela. The patient is tolerating medications without difficulties.  A few days ago, he did have an episode of syncope.  He was sitting at his desk when he had his episode.  He felt an aura, over him, and then passed out.  He found his phone on the ground.  He was sitting in a chair at the time.  Linq monitor shows significant 10-second pause with sinus slowing prepause.   Past Medical History:  Diagnosis Date   CAD (coronary artery disease) 07/13/2021   CCTA 06/2021: CAC score 260 (84th percentile); mLAD 70-99; mLAD FFR pos (0.60) Status post DES to the mid LAD 06/2021   Carotid stenosis 08/11/2021   Carotid US 07/2021: Bilat ICA 1-39   Deafness in right ear    Family history of pancreatic cancer    Family history of prostate cancer    Family  history of skin cancer    Hyperlipidemia LDL goal <70 08/20/2018   PAF (paroxysmal atrial fibrillation) (HCC) 05/06/2021   Sleep apnea    Past Surgical History:  Procedure Laterality Date   CORONARY STENT INTERVENTION N/A 07/15/2021   Procedure: CORONARY STENT INTERVENTION;  Surgeon: Lyn Records, MD;  Location: [facility] INVASIVE CV LAB;  Service: Cardiovascular;  Laterality: N/A;   INTRAVASCULAR ULTRASOUND/IVUS N/A 07/15/2021   Procedure: Intravascular Ultrasound/IVUS;  Surgeon: Lyn Records, MD;  Location: [facility] INVASIVE CV LAB;  Service: Cardiovascular;  Laterality: N/A;   LEFT HEART CATH AND CORONARY ANGIOGRAPHY N/A 07/15/2021   Procedure: LEFT HEART CATH AND CORONARY ANGIOGRAPHY;  Surgeon: Lyn Records, MD;  Location: [facility] INVASIVE CV LAB;  Service: Cardiovascular;  Laterality: N/A;   NASAL SEPTUM SURGERY       Current Outpatient Medications  Medication Sig Dispense Refill   aspirin EC 81 MG tablet Take 1 tablet (81 mg total) by mouth daily. 90 tablet 3   clopidogrel (PLAVIX) 75 MG tablet Take 1 tablet (75 mg total) by mouth daily. 90 tablet 1   diltiazem (CARDIZEM) 30 MG tablet Take 1 tablet (30 mg total) by mouth 4 (four) times daily as needed. 30 tablet 3   metoprolol succinate (TOPROL XL) 25 MG 24 hr tablet Take 0.5 tablets (12.5 mg total) by mouth daily. X 2 days than discontinue on June 19,2023. 90 tablet 3   Multiple Vitamins-Minerals (MULTI  FOR HIM) TABS Take 1 tablet by mouth in the morning.     nitroGLYCERIN (NITROSTAT) 0.4 MG SL tablet Place 1 tablet (0.4 mg total) under the tongue every 5 (five) minutes as needed for chest pain. 90 tablet 3   Polyethyl Glycol-Propyl Glycol (LUBRICANT EYE DROPS) 0.4-0.3 % SOLN Place 1-2 drops into both eyes 3 (three) times daily as needed (dry/irritated eyes.).     rosuvastatin (CRESTOR) 20 MG tablet Take 2 tablets (40 mg total) by mouth daily. 180 tablet 3   No current facility-administered medications for this visit.    Allergies:   Bee venom    Social History:  The patient  reports that he has never smoked. He has never used smokeless tobacco. He reports that he does not drink alcohol and does not use drugs.   Family History:  The patient's family history includes Heart attack in his father; Pancreatic cancer in his maternal grandmother; Prostate cancer in his brother and brother; Skin cancer in his sister.   ROS:  Please see the history of present illness.   Otherwise, review of systems is positive for none.   All other systems are reviewed and negative.   PHYSICAL EXAM: VS:  BP 110/76   Pulse 86   Ht 5\' 6"  (1.676 m)   Wt 194 lb 12.8 oz (88.4 kg)   SpO2 95%   BMI 31.44 kg/m  , BMI Body mass index is 31.44 kg/m. GEN: Well nourished, well developed, in no acute distress  HEENT: normal  Neck: no JVD, carotid bruits, or masses Cardiac: RRR; no murmurs, rubs, or gallops,no edema  Respiratory:  clear to auscultation bilaterally, normal work of breathing GI: soft, nontender, nondistended, + BS MS: no deformity or atrophy  Skin: warm and dry, device site well healed Neuro:  Strength and sensation are intact Psych: euthymic mood, full affect  EKG:  EKG is not ordered today. Personal review of the ekg ordered 07/26/21 shows sinus rhythm, rate 71  Personal review of the device interrogation today. Results in [place]: 07/13/2021: BUN 19; Creatinine, Ser 0.96; Hemoglobin 17.5; Platelets 196; Potassium 4.4; Sodium 140    Lipid Panel     Component Value Date/Time   CHOL 171 09/30/2020 0809   TRIG 83 09/30/2020 0809   HDL 45 09/30/2020 0809   CHOLHDL 3.8 09/30/2020 0809   CHOLHDL 5 03/30/2020 1011   VLDL 24.2 03/30/2020 1011   LDLCALC 110 (H) 09/30/2020 0809     Wt Readings from Last 3 Encounters:  08/31/21 194 lb 12.8 oz (88.4 kg)  08/05/21 200 lb 9.6 oz (91 kg)  07/15/21 200 lb (90.7 kg)      Other studies Reviewed: Additional studies/ records that were reviewed today include: TTE 06/03/20  Review  of the above records today demonstrates:   1. Left ventricular ejection fraction, by estimation, is 55 to 60%. The  left ventricle has normal function. The left ventricle has no regional  wall motion abnormalities. Left ventricular diastolic parameters are  consistent with Grade I diastolic  dysfunction (impaired relaxation).   2. Right ventricular systolic function is normal. The right ventricular  size is normal.   3. The mitral valve is normal in structure. Trivial mitral valve  regurgitation.   4. The aortic valve is normal in structure. There is mild thickening of  the aortic valve. Aortic valve regurgitation is trivial. No aortic  stenosis is present.   Cardiac monitor 04/29/2020 personally reviewed Patch wear time was 13  days and 9 hours Predominant rhythm was NSR with average HR 84 (ranged from 48-172bpm) Rare SVE, rare PVCs <1% No Afib, significant pauses or sustained arrhythmias Triggered events mainly correlated with PACs. Overall, normal cardiac monitor    ASSESSMENT AND PLAN:  1.  Syncope: Status post Linq monitor implant.  Linq monitor has shown episodes of significant bradycardia.  He did have a vagal episode with significant pause and syncope.  He states that he does not know of any trigger for his episode.  Due to this, and the lack of rate controlling medications, we Tatisha Cerino plan for pacemaker implant.  Risk and benefits were discussed with bleeding, tamponade, infection, pneumothorax, lead dislodgment.  He understands these risks and is agreed to the procedure.  2.  Paroxysmal atrial fibrillation: Currently not anticoagulated.  CHA2DS2-VASc of 1.  On as needed diltiazem.  3.  Coronary artery calcifications: Currently on Crestor 40 mg daily.  Plan per primary cardiology.   Current medicines are reviewed at length with the patient today.   The patient does not have concerns regarding his medicines.  The following changes were made today: Start diltiazem as  above  Labs/ tests ordered today include:  Orders Placed This Encounter  Procedures   Basic metabolic panel   CBC      Disposition:   FU with Chin Wachter 3 months  Signed, Vickii Volland Jorja Loa, MD  08/31/2021 2:42 PM     [facility] [address] [place] [address] [phone] (office) [phone] (fax)

## 2021-08-31 NOTE — Patient Instructions (Signed)
Medication Instructions:  Your physician recommends that you continue on your current medications as directed. Please refer to the Current Medication list given to you today.     * If you need a refill on your cardiac medications before your next appointment, please call your pharmacy. *   Labwork: Pre procedure lab work today: BMET & CBC  * Will notify you of abnormal results, otherwise continue current treatment plan.*   Testing/Procedures: Your physician has recommended that you have a pacemaker inserted. A pacemaker is a small device that is placed under the skin of your chest or abdomen to help control abnormal heart rhythms. This device uses electrical pulses to prompt the heart to beat at a normal rate. Pacemakers are used to treat heart rhythms that are too slow. Wire (leads) are attached to the pacemaker that goes into the chambers of you heart. This is done in the hospital and usually requires and overnight stay. Please follow the instructions below, located under the special instructions section.   Follow-Up: Your physician recommends that you schedule a wound check appointment 10-14 days, after your procedure on 09/01/21, with the device clinic.  Your physician recommends that you schedule a follow up appointment in 91 days, after your procedure on 09/01/21, with Dr. Elberta Fortis.  Thank you for choosing [facility]!!   Dory Horn, RN [phone]    Other  Instructions    Pacemaker Implantation, Adult Pacemaker implantation is a procedure to place a pacemaker inside your chest. A pacemaker is a small computer that sends electrical signals to the heart and helps your heart beat normally. A pacemaker also stores information about your heart rhythms. You may need pacemaker implantation if you: Have a slow heartbeat (bradycardia). Faint (syncope). Have shortness of breath (dyspnea) due to heart problems.  The pacemaker attaches to your heart through a wire, called a  lead. Sometimes just one lead is needed. Other times, there will be two leads. There are two types of pacemakers: Transvenous pacemaker. This type is placed under the skin or muscle of your chest. The lead goes through a vein in the chest area to reach the inside of the heart. Epicardial pacemaker. This type is placed under the skin or muscle of your chest or belly. The lead goes through your chest to the outside of the heart.  Tell a health care provider about: Any allergies you have. All medicines you are taking, including vitamins, herbs, eye drops, creams, and over-the-counter medicines. Any problems you or family members have had with anesthetic medicines. Any blood or bone disorders you have. Any surgeries you have had. Any medical conditions you have. Whether you are pregnant or may be pregnant. What are the risks? Generally, this is a safe procedure. However, problems may occur, including: Infection. Bleeding. Failure of the pacemaker or the lead. Collapse of a lung or bleeding into a lung. Blood clot inside a blood vessel with a lead. Damage to the heart. Infection inside the heart (endocarditis). Allergic reactions to medicines.  What happens before the procedure? Staying hydrated Follow instructions from your health care provider about hydration, which may include: Up to 2 hours before the procedure - you may continue to drink clear liquids, such as water, clear fruit juice, black coffee, and plain tea.  Eating and drinking restrictions Follow instructions from your health care provider about eating and drinking, which may include: 8 hours before the procedure - stop eating heavy meals or foods such as meat, fried foods, or fatty  foods. 6 hours before the procedure - stop eating light meals or foods, such as toast or cereal. 6 hours before the procedure - stop drinking milk or drinks that contain milk. 2 hours before the procedure - stop drinking clear  liquids.  Medicines Ask your health care provider about: Changing or stopping your regular medicines. This is especially important if you are taking diabetes medicines or blood thinners. Taking medicines such as aspirin and ibuprofen. These medicines can thin your blood. Do not take these medicines before your procedure if your health care provider instructs you not to. You may be given antibiotic medicine to help prevent infection. General instructions You will have a heart evaluation. This may include an electrocardiogram (ECG), chest X-ray, and heart imaging (echocardiogram,  or echo) tests. You will have blood tests. Do not use any products that contain nicotine or tobacco, such as cigarettes and e-cigarettes. If you need help quitting, ask your health care provider. Plan to have someone take you home from the hospital or clinic. If you will be going home right after the procedure, plan to have someone with you for 24 hours. Ask your health care provider how your surgical site will be marked or identified. What happens during the procedure? To reduce your risk of infection: Your health care team will wash or sanitize their hands. Your skin will be washed with soap. Hair may be removed from the surgical area. An IV tube will be inserted into one of your veins. You will be given one or more of the following: A medicine to help you relax (sedative). A medicine to numb the area (local anesthetic). A medicine to make you fall asleep (general anesthetic). If you are getting a transvenous pacemaker: An incision will be made in your upper chest. A pocket will be made for the pacemaker. It may be placed under the skin or between layers of muscle. The lead will be inserted into a blood vessel that returns to the heart. While X-rays are taken by an imaging machine (fluoroscopy), the lead will be advanced through the vein to the inside of your heart. The other end of the lead will be tunneled  under the skin and attached to the pacemaker. If you are getting an epicardial pacemaker: An incision will be made near your ribs or breastbone (sternum) for the lead. The lead will be attached to the outside of your heart. Another incision will be made in your chest or upper belly to create a pocket for the pacemaker. The free end of the lead will be tunneled under the skin and attached to the pacemaker. The transvenous or epicardial pacemaker will be tested. Imaging studies may be done to check the lead position. The incisions will be closed with stitches (sutures), adhesive strips, or skin glue. Bandages (dressing) will be placed over the incisions. The procedure may vary among health care providers and hospitals. What happens after the procedure? Your blood pressure, heart rate, breathing rate, and blood oxygen level will be monitored until the medicines you were given have worn off. You will be given antibiotics and pain medicine. ECG and chest x-rays will be done. You will wear a continuous type of ECG (Holter monitor) to check your heart rhythm. Your health care provider will program the pacemaker. Do not drive for 24 hours if you received a sedative. This information is not intended to replace advice given to you by your health care provider. Make sure you discuss any questions you have with your  health care provider. Document Released: 01/27/2002 Document Revised: 08/27/2015 Document Reviewed: 07/21/2015 Elsevier Interactive Patient Education  2018 [employer].     Pacemaker Implantation, Adult, Care After This sheet gives you information about how to care for yourself after your procedure. Your health care provider may also give you more specific instructions. If you have problems or questions, contact your health care provider. What can I expect after the procedure? After the procedure, it is common to have: Mild pain. Slight bruising. Some swelling over the incision. A  slight bump over the skin where the device was placed. Sometimes, it is possible to feel the device under the skin. This is normal.  Follow these instructions at home: Medicines Take over-the-counter and prescription medicines only as told by your health care provider. If you were prescribed an antibiotic medicine, take it as told by your health care provider. Do not stop taking the antibiotic even if you start to feel better. Wound care Do not remove the bandage on your chest until directed to do so by your health care provider. After your bandage is removed, you may see pieces of tape called skin adhesive strips over the area where the cut was made (incision site). Let them fall off on their own. Check the incision site every day to make sure it is not infected, bleeding, or starting to pull apart. Do not use lotions or ointments near the incision site unless directed to do so. Keep the incision area clean and dry for 2-3 days after the procedure or as directed by your health care provider. It takes several weeks for the incision site to completely heal. Do not take baths, swim, or use a hot tub for 7-10 days or as otherwise directed by your health care provider. Activity Do not drive or use heavy machinery while taking prescription pain medicine. Do not drive for 24 hours if you were given a medicine to help you relax (sedative). Check with your health care provider before you start to drive or play sports. Avoid sudden jerking, pulling, or chopping movements that pull your upper arm far away from your body. Avoid these movements for at least 6 weeks or as long as told by your health care provider. Do not lift your upper arm above your shoulders for at least 6 weeks or as long as told by your health care provider. This means no tennis, golf, or swimming. You may go back to work when your health care provider says it is okay. Pacemaker care You may be shown how to transfer data from your  pacemaker through the phone to your health care provider. Always let all health care providers know about your pacemaker before you have any medical procedures or tests. Wear a medical ID bracelet or necklace stating that you have a pacemaker. Carry a pacemaker ID card with you at all times. Your pacemaker battery will last for 5-15 years. Routine checks by your health care provider will let the health care provider know when the battery is starting to run down. The pacemaker will need to be replaced when the battery starts to run down. Do not use amateur Proofreader. Other electrical devices are safe to use, including power tools, lawn mowers, and speakers. If you are unsure of whether something is safe to use, ask your health care provider. When using your cell phone, hold it to the ear opposite the pacemaker. Do not leave your cell phone in a pocket over the  pacemaker. Avoid places or objects that have a strong electric or magnetic field, including: Scientist, physiological. When at the airport, let officials know that you have a pacemaker. Power plants. Large electrical generators. Radiofrequency transmission towers, such as cell phone and radio towers. General instructions Weigh yourself every day. If you suddenly gain weight, fluid may be building up in your body. Keep all follow-up visits as told by your health care provider. This is important. Contact a health care provider if: You gain weight suddenly. Your legs or feet swell. It feels like your heart is fluttering or skipping beats (heart palpitations). You have chills or a fever. You have more redness, swelling, or pain around your incisions. You have more fluid or blood coming from your incisions. Your incisions feel warm to the touch. You have pus or a bad smell coming from your incisions. Get help right away if: You have chest pain. You have trouble breathing or are short of breath. You become  extremely tired. You are light-headed or you faint. This information is not intended to replace advice given to you by your health care provider. Make sure you discuss any questions you have with your health care provider. Document Released: 08/26/2004 Document Revised: 11/19/2015 Document Reviewed: 11/19/2015 Elsevier Interactive Patient Education  2018 [employer].    Supplemental Discharge Instructions for  Pacemaker/Defibrillator Patients  ACTIVITY No heavy lifting or vigorous activity with your left/right arm for 6 to 8 weeks.  Do not raise your left/right arm above your head for one week.  Gradually raise your affected arm as drawn below.           __  NO DRIVING for     ; you may begin driving on     .  WOUND CARE Keep the wound area clean and dry.  Do not get this area wet for one week. No showers for one week; you may shower on     . The tape/steri-strips on your wound will fall off; do not pull them off.  No bandage is needed on the site.  DO  NOT apply any creams, oils, or ointments to the wound area. If you notice any drainage or discharge from the wound, any swelling or bruising at the site, or you develop a fever > 101? F after you are discharged home, call the office at once.  SPECIAL INSTRUCTIONS You are still able to use cellular telephones; use the ear opposite the side where you have your pacemaker/defibrillator.  Avoid carrying your cellular phone near your device. When traveling through airports, show security personnel your identification card to avoid being screened in the metal detectors.  Ask the security personnel to use the hand wand. Avoid arc welding equipment, MRI testing (magnetic resonance imaging), TENS units (transcutaneous nerve stimulators).  Call the office for questions about other devices. Avoid electrical appliances that are in poor condition or are not properly grounded. Microwave ovens are safe to be near or to operate.  ADDITIONAL  INFORMATION FOR DEFIBRILLATOR PATIENTS SHOULD YOUR DEVICE GO OFF: If your device goes off ONCE and you feel fine afterward, notify the device clinic nurses. If your device goes off ONCE and you do not feel well afterward, call 911. If your device goes off TWICE, call 911. If your device goes off THREE TIMES IN ONE DAY, call 911.  DO NOT DRIVE YOURSELF OR A FAMILY MEMBER WITH A DEFIBRILLATOR TO THE HOSPITAL--CALL 911.

## 2021-08-31 NOTE — Progress Notes (Signed)
Electrophysiology Office Note   Date:  08/31/2021   ID:  Peggy Monk, DOB 05/10/1960, MRN [id number]  PCP:  [place], Betty G, MD  Cardiologist: Shari Prows Primary Electrophysiologist:  Doreather Hoxworth Jorja Loa, MD    Chief Complaint: Syncope   History of Present Illness: Brandon Madden is a 61 y.o. male who is being seen today for the evaluation of syncope at the request of [place], Timoteo Expose, MD. Presenting today for electrophysiology evaluation.  He has a history seen for hypertension COVID-19 infection.  He had an episode of syncope.  He was sitting in a restaurant and suddenly felt like he was going into a hole.  He had dizziness and profound fatigue.  He lost consciousness for 20 to 25 seconds before returning to baseline.  The day before, he completed an 11 mile bike ride.  He had had several similar episodes but did not lose consciousness.  He had a normal neurologic work-up.  He is status post Linq monitor implant.  Linq monitor showed a 22-hour episode of atrial fibrillation.  Today, denies symptoms of palpitations, chest pain, shortness of breath, orthopnea, PND, lower extremity edema, claudication, dizziness, presyncope, bleeding, or neurologic sequela. The patient is tolerating medications without difficulties.  A few days ago, he did have an episode of syncope.  He was sitting at his desk when he had his episode.  He felt an aura, over him, and then passed out.  He found his phone on the ground.  He was sitting in a chair at the time.  Linq monitor shows significant 10-second pause with sinus slowing prepause.   Past Medical History:  Diagnosis Date   CAD (coronary artery disease) 07/13/2021   CCTA 06/2021: CAC score 260 (84th percentile); mLAD 70-99; mLAD FFR pos (0.60) Status post DES to the mid LAD 06/2021   Carotid stenosis 08/11/2021   Carotid US 07/2021: Bilat ICA 1-39   Deafness in right ear    Family history of pancreatic cancer    Family history of prostate cancer    Family  history of skin cancer    Hyperlipidemia LDL goal <70 08/20/2018   PAF (paroxysmal atrial fibrillation) (HCC) 05/06/2021   Sleep apnea    Past Surgical History:  Procedure Laterality Date   CORONARY STENT INTERVENTION N/A 07/15/2021   Procedure: CORONARY STENT INTERVENTION;  Surgeon: Lyn Records, MD;  Location: [facility] INVASIVE CV LAB;  Service: Cardiovascular;  Laterality: N/A;   INTRAVASCULAR ULTRASOUND/IVUS N/A 07/15/2021   Procedure: Intravascular Ultrasound/IVUS;  Surgeon: Lyn Records, MD;  Location: [facility] INVASIVE CV LAB;  Service: Cardiovascular;  Laterality: N/A;   LEFT HEART CATH AND CORONARY ANGIOGRAPHY N/A 07/15/2021   Procedure: LEFT HEART CATH AND CORONARY ANGIOGRAPHY;  Surgeon: Lyn Records, MD;  Location: [facility] INVASIVE CV LAB;  Service: Cardiovascular;  Laterality: N/A;   NASAL SEPTUM SURGERY       Current Outpatient Medications  Medication Sig Dispense Refill   aspirin EC 81 MG tablet Take 1 tablet (81 mg total) by mouth daily. 90 tablet 3   clopidogrel (PLAVIX) 75 MG tablet Take 1 tablet (75 mg total) by mouth daily. 90 tablet 1   diltiazem (CARDIZEM) 30 MG tablet Take 1 tablet (30 mg total) by mouth 4 (four) times daily as needed. 30 tablet 3   metoprolol succinate (TOPROL XL) 25 MG 24 hr tablet Take 0.5 tablets (12.5 mg total) by mouth daily. X 2 days than discontinue on June 19,2023. 90 tablet 3   Multiple Vitamins-Minerals (MULTI  FOR HIM) TABS Take 1 tablet by mouth in the morning.     nitroGLYCERIN (NITROSTAT) 0.4 MG SL tablet Place 1 tablet (0.4 mg total) under the tongue every 5 (five) minutes as needed for chest pain. 90 tablet 3   Polyethyl Glycol-Propyl Glycol (LUBRICANT EYE DROPS) 0.4-0.3 % SOLN Place 1-2 drops into both eyes 3 (three) times daily as needed (dry/irritated eyes.).     rosuvastatin (CRESTOR) 20 MG tablet Take 2 tablets (40 mg total) by mouth daily. 180 tablet 3   No current facility-administered medications for this visit.    Allergies:   Bee venom    Social History:  The patient  reports that he has never smoked. He has never used smokeless tobacco. He reports that he does not drink alcohol and does not use drugs.   Family History:  The patient's family history includes Heart attack in his father; Pancreatic cancer in his maternal grandmother; Prostate cancer in his brother and brother; Skin cancer in his sister.   ROS:  Please see the history of present illness.   Otherwise, review of systems is positive for none.   All other systems are reviewed and negative.   PHYSICAL EXAM: VS:  BP 110/76   Pulse 86   Ht 5\' 6"  (1.676 m)   Wt 194 lb 12.8 oz (88.4 kg)   SpO2 95%   BMI 31.44 kg/m  , BMI Body mass index is 31.44 kg/m. GEN: Well nourished, well developed, in no acute distress  HEENT: normal  Neck: no JVD, carotid bruits, or masses Cardiac: RRR; no murmurs, rubs, or gallops,no edema  Respiratory:  clear to auscultation bilaterally, normal work of breathing GI: soft, nontender, nondistended, + BS MS: no deformity or atrophy  Skin: warm and dry, device site well healed Neuro:  Strength and sensation are intact Psych: euthymic mood, full affect  EKG:  EKG is not ordered today. Personal review of the ekg ordered 07/26/21 shows sinus rhythm, rate 71  Personal review of the device interrogation today. Results in [facility]: 07/13/2021: BUN 19; Creatinine, Ser 0.96; Hemoglobin 17.5; Platelets 196; Potassium 4.4; Sodium 140    Lipid Panel     Component Value Date/Time   CHOL 171 09/30/2020 0809   TRIG 83 09/30/2020 0809   HDL 45 09/30/2020 0809   CHOLHDL 3.8 09/30/2020 0809   CHOLHDL 5 03/30/2020 1011   VLDL 24.2 03/30/2020 1011   LDLCALC 110 (H) 09/30/2020 0809     Wt Readings from Last 3 Encounters:  08/31/21 194 lb 12.8 oz (88.4 kg)  08/05/21 200 lb 9.6 oz (91 kg)  07/15/21 200 lb (90.7 kg)      Other studies Reviewed: Additional studies/ records that were reviewed today include: TTE 06/03/20  Review  of the above records today demonstrates:   1. Left ventricular ejection fraction, by estimation, is 55 to 60%. The  left ventricle has normal function. The left ventricle has no regional  wall motion abnormalities. Left ventricular diastolic parameters are  consistent with Grade I diastolic  dysfunction (impaired relaxation).   2. Right ventricular systolic function is normal. The right ventricular  size is normal.   3. The mitral valve is normal in structure. Trivial mitral valve  regurgitation.   4. The aortic valve is normal in structure. There is mild thickening of  the aortic valve. Aortic valve regurgitation is trivial. No aortic  stenosis is present.   Cardiac monitor 04/29/2020 personally reviewed Patch wear time was 13  days and 9 hours Predominant rhythm was NSR with average HR 84 (ranged from 48-172bpm) Rare SVE, rare PVCs <1% No Afib, significant pauses or sustained arrhythmias Triggered events mainly correlated with PACs. Overall, normal cardiac monitor    ASSESSMENT AND PLAN:  1.  Syncope: Status post Linq monitor implant.  Linq monitor has shown episodes of significant bradycardia.  He did have a vagal episode with significant pause and syncope.  He states that he does not know of any trigger for his episode.  Due to this, and the lack of rate controlling medications, we Lathan Gieselman plan for pacemaker implant.  Risk and benefits were discussed with bleeding, tamponade, infection, pneumothorax, lead dislodgment.  He understands these risks and is agreed to the procedure.  2.  Paroxysmal atrial fibrillation: Currently not anticoagulated.  CHA2DS2-VASc of 1.  On as needed diltiazem.  3.  Coronary artery calcifications: Currently on Crestor 40 mg daily.  Plan per primary cardiology.   Current medicines are reviewed at length with the patient today.   The patient does not have concerns regarding his medicines.  The following changes were made today: Start diltiazem as  above  Labs/ tests ordered today include:  Orders Placed This Encounter  Procedures   Basic metabolic panel   CBC      Disposition:   FU with Kenijah Benning[place] 3 months  Signed, Wandalee Klang Jorja Loa, MD  08/31/2021 2:42 PM     [facility] [address] [place] [address] [phone] (office) [phone] (fax)

## 2021-09-01 ENCOUNTER — Ambulatory Visit (HOSPITAL_COMMUNITY)
Admission: RE | Admit: 2021-09-01 | Discharge: 2021-09-01 | Disposition: A | Discharge status: Home / Self Care | Payer: BC Managed Care – PPO | Attending: Cardiology | Admitting: Cardiology

## 2021-09-01 ENCOUNTER — Ambulatory Visit (HOSPITAL_COMMUNITY): Payer: BC Managed Care – PPO

## 2021-09-01 ENCOUNTER — Encounter (HOSPITAL_COMMUNITY)
Admission: RE | Disposition: A | Discharge status: Home / Self Care | Payer: Self-pay | Source: Home / Self Care | Attending: Cardiology

## 2021-09-01 ENCOUNTER — Other Ambulatory Visit: Payer: Self-pay

## 2021-09-01 DIAGNOSIS — R55 Syncope and collapse: Secondary | ICD-10-CM | POA: Diagnosis not present

## 2021-09-01 DIAGNOSIS — I10 Essential (primary) hypertension: Secondary | ICD-10-CM | POA: Diagnosis not present

## 2021-09-01 DIAGNOSIS — I459 Conduction disorder, unspecified: Secondary | ICD-10-CM | POA: Insufficient documentation

## 2021-09-01 DIAGNOSIS — I480 Paroxysmal atrial fibrillation: Secondary | ICD-10-CM | POA: Diagnosis not present

## 2021-09-01 DIAGNOSIS — I2510 Atherosclerotic heart disease of native coronary artery without angina pectoris: Secondary | ICD-10-CM | POA: Insufficient documentation

## 2021-09-01 DIAGNOSIS — Z79899 Other long term (current) drug therapy: Secondary | ICD-10-CM | POA: Diagnosis not present

## 2021-09-01 DIAGNOSIS — I442 Atrioventricular block, complete: Secondary | ICD-10-CM | POA: Diagnosis not present

## 2021-09-01 HISTORY — PX: LOOP RECORDER REMOVAL: EP1215

## 2021-09-01 HISTORY — PX: PACEMAKER IMPLANT: EP1218

## 2021-09-01 SURGERY — PACEMAKER IMPLANT

## 2021-09-01 MED ORDER — CEFAZOLIN SODIUM-DEXTROSE 2-4 GM/100ML-% IV SOLN
2.0000 g | INTRAVENOUS | Status: AC
  Administered 2021-09-01: 2 g via INTRAVENOUS

## 2021-09-01 MED ORDER — ACETAMINOPHEN 325 MG PO TABS: 325.0000 mg | ORAL_TABLET | ORAL | Status: DC | PRN

## 2021-09-01 MED ORDER — SODIUM CHLORIDE 0.9 % IV SOLN
INTRAVENOUS | Status: AC
  Filled 2021-09-01: qty 2

## 2021-09-01 MED ORDER — CEFAZOLIN SODIUM-DEXTROSE 2-4 GM/100ML-% IV SOLN
INTRAVENOUS | Status: AC
  Filled 2021-09-01: qty 100

## 2021-09-01 MED ORDER — FENTANYL CITRATE (PF) 100 MCG/2ML IJ SOLN
INTRAMUSCULAR | Status: AC
  Filled 2021-09-01: qty 2

## 2021-09-01 MED ORDER — LIDOCAINE HCL (PF) 1 % IJ SOLN
INTRAMUSCULAR | Status: DC | PRN
  Administered 2021-09-01: 60 mL

## 2021-09-01 MED ORDER — POVIDONE-IODINE 10 % EX SWAB
2.0000 | Freq: Once | CUTANEOUS | Status: AC
  Administered 2021-09-01: 2 via TOPICAL

## 2021-09-01 MED ORDER — FENTANYL CITRATE (PF) 100 MCG/2ML IJ SOLN
INTRAMUSCULAR | Status: DC | PRN
  Administered 2021-09-01 (×3): 25 ug via INTRAVENOUS

## 2021-09-01 MED ORDER — SODIUM CHLORIDE 0.9 % IV SOLN
80.0000 mg | INTRAVENOUS | Status: AC
  Administered 2021-09-01: 80 mg

## 2021-09-01 MED ORDER — MIDAZOLAM HCL 5 MG/5ML IJ SOLN
INTRAMUSCULAR | Status: AC
  Filled 2021-09-01: qty 5

## 2021-09-01 MED ORDER — CHLORHEXIDINE GLUCONATE 4 % EX LIQD
4.0000 | Freq: Once | CUTANEOUS | Status: DC
  Filled 2021-09-01: qty 60

## 2021-09-01 MED ORDER — MIDAZOLAM HCL 5 MG/5ML IJ SOLN
INTRAMUSCULAR | Status: DC | PRN
  Administered 2021-09-01 (×3): 1 mg via INTRAVENOUS

## 2021-09-01 MED ORDER — HEPARIN (PORCINE) IN NACL 1000-0.9 UT/500ML-% IV SOLN
INTRAVENOUS | Status: DC | PRN
  Administered 2021-09-01: 500 mL

## 2021-09-01 MED ORDER — HEPARIN (PORCINE) IN NACL 1000-0.9 UT/500ML-% IV SOLN
INTRAVENOUS | Status: AC
  Filled 2021-09-01: qty 500

## 2021-09-01 MED ORDER — CEFAZOLIN SODIUM-DEXTROSE 1-4 GM/50ML-% IV SOLN
1.0000 g | Freq: Four times a day (QID) | INTRAVENOUS | Status: DC
  Administered 2021-09-01: 1 g via INTRAVENOUS
  Filled 2021-09-01: qty 50

## 2021-09-01 MED ORDER — ONDANSETRON HCL 4 MG/2ML IJ SOLN: 4.0000 mg | Freq: Four times a day (QID) | INTRAMUSCULAR | Status: DC | PRN

## 2021-09-01 MED ORDER — SODIUM CHLORIDE 0.9 % IV SOLN: INTRAVENOUS | Status: DC

## 2021-09-01 MED ORDER — LIDOCAINE HCL (PF) 1 % IJ SOLN
INTRAMUSCULAR | Status: AC
  Filled 2021-09-01: qty 60

## 2021-09-01 SURGICAL SUPPLY — 14 items
CABLE SURGICAL S-101-97-12 (CABLE) ×2 IMPLANT
CATH RIGHTSITE C315HIS02 (CATHETERS) ×1 IMPLANT
IPG PACE AZUR XT DR MRI W1DR01 (Pacemaker) IMPLANT
LEAD CAPSURE NOVUS 5076-52CM (Lead) ×1 IMPLANT
LEAD SELECT SECURE 3830 383069 (Lead) IMPLANT
PACE AZURE XT DR MRI W1DR01 (Pacemaker) ×2 IMPLANT
PACK LOOP INSERTION (CUSTOM PROCEDURE TRAY) ×2 IMPLANT
PAD DEFIB RADIO PHYSIO CONN (PAD) ×2 IMPLANT
SELECT SECURE 3830 383069 (Lead) ×2 IMPLANT
SHEATH 7FR PRELUDE SNAP 13 (SHEATH) ×2 IMPLANT
SHEATH PROBE COVER 6X72 (BAG) ×1 IMPLANT
SLITTER 6232ADJ (MISCELLANEOUS) ×1 IMPLANT
TRAY PACEMAKER INSERTION (PACKS) ×2 IMPLANT
WIRE HI TORQ VERSACORE-J 145CM (WIRE) ×1 IMPLANT

## 2021-09-01 NOTE — Interval H&P Note (Signed)
History and Physical Interval Note:  09/01/2021 3:08 PM  Brandon Madden  has presented today for surgery, with the diagnosis of heart block.  The various methods of treatment have been discussed with the patient and family. After consideration of risks, benefits and other options for treatment, the patient has consented to  Procedure(s): PACEMAKER IMPLANT (N/A) LOOP RECORDER REMOVAL (N/A) as a surgical intervention.  The patient's history has been reviewed, patient examined, no change in status, stable for surgery.  I have reviewed the patient's chart and labs.  Questions were answered to the patient's satisfaction.     Brandon Madden [employer]

## 2021-09-01 NOTE — Progress Notes (Signed)
Carelink Summary Report / Loop Recorder 

## 2021-09-01 NOTE — Progress Notes (Signed)
Dr Camnitz notified of post CXR-advised to proceed with d/c 

## 2021-09-01 NOTE — Discharge Instructions (Signed)
After Your Pacemaker   You have a [employer] Pacemaker  ACTIVITY Do not lift your arm above shoulder height for 1 week after your procedure. After 7 days, you may progress as below.  You should remove your sling 24 hours after your procedure, unless otherwise instructed by your provider.     Thursday September 08, 2021  Friday September 09, 2021 Saturday September 10, 2021 Sunday September 11, 2021   Do not lift, push, pull, or carry anything over 10 pounds with the affected arm until 6 weeks (Thursday October 13, 2021 ) after your procedure.   You may drive AFTER your wound check, unless you have been told otherwise by your provider.   Ask your healthcare provider when you can go back to work   INCISION/Dressing If you are on a blood thinner such as Coumadin, Xarelto, Eliquis, Plavix, or Pradaxa please confirm with your provider when this should be resumed.   If large square, outer bandage is left in place, this can be removed after 24 hours from your procedure. Do not remove steri-strips or glue as below.   Monitor your Pacemaker site for redness, swelling, and drainage. Call the device clinic at [phone] if you experience these symptoms or fever/chills.  If your incision is sealed with Steri-strips or staples, you may shower 7 days after your procedure or when told by your provider. Do not remove the steri-strips or let the shower hit directly on your site. You may wash around your site with soap and water.    If you were discharged in a sling, please do not wear this during the day more than 48 hours after your surgery unless otherwise instructed. This may increase the risk of stiffness and soreness in your shoulder.   Avoid lotions, ointments, or perfumes over your incision until it is well-healed.  You may use a hot tub or a pool AFTER your wound check appointment if the incision is completely closed.  Pacemaker Alerts:  Some alerts are vibratory and others beep. These are NOT emergencies.  Please call our office to let us know. If this occurs at night or on weekends, it can wait until the next business day. Send a remote transmission.  If your device is capable of reading fluid status (for heart failure), you will be offered monthly monitoring to review this with you.   DEVICE MANAGEMENT Remote monitoring is used to monitor your pacemaker from home. This monitoring is scheduled every 91 days by our office. It allows [place] to keep an eye on the functioning of your device to ensure it is working properly. You will routinely see your Electrophysiologist annually (more often if necessary).   You should receive your ID card for your new device in 4-8 weeks. Keep this card with you at all times once received. Consider wearing a medical alert bracelet or necklace.  Your Pacemaker may be MRI compatible. This will be discussed at your next office visit/wound check.  You should avoid contact with strong electric or magnetic fields.   Do not use amateur (ham) radio equipment or electric (arc) welding torches. MP3 player headphones with magnets should not be used. Some devices are safe to use if held at least 12 inches (30 cm) from your Pacemaker. These include power tools, lawn mowers, and speakers. If you are unsure if something is safe to use, ask your health care provider.  When using your cell phone, hold it to the ear that is on the opposite side from the  Pacemaker. Do not leave your cell phone in a pocket over the Pacemaker.  You may safely use electric blankets, heating pads, computers, and microwave ovens.  Call the office right away if: You have chest pain. You feel more short of breath than you have felt before. You feel more light-headed than you have felt before. Your incision starts to open up.  This information is not intended to replace advice given to you by your health care provider. Make sure you discuss any questions you have with your health care provider.

## 2021-09-02 ENCOUNTER — Encounter (HOSPITAL_COMMUNITY): Payer: Self-pay | Admitting: Cardiology

## 2021-09-08 ENCOUNTER — Telehealth: Payer: Self-pay | Admitting: Pulmonary Disease

## 2021-09-08 DIAGNOSIS — G4733 Obstructive sleep apnea (adult) (pediatric): Secondary | ICD-10-CM

## 2021-09-08 NOTE — Telephone Encounter (Signed)
I called the patient and left a brief message to call back.

## 2021-09-08 NOTE — Telephone Encounter (Signed)
ATC patient. LVMTCB. 

## 2021-09-08 NOTE — Telephone Encounter (Signed)
Call patient  Sleep study result  Date of study: 08/29/2021  Impression: Moderate obstructive sleep apnea Mild oxygen desaturations  Recommendation: DME referral  Recommend CPAP therapy for moderate obstructive sleep apnea  Auto titrating CPAP with pressure settings of 5-15 will be appropriate  Encourage weight loss measures  Follow-up in the office 4 to 6 weeks following initiation of treatment   .Marland Kitchen  Already on CPAP, needs new machine

## 2021-09-08 NOTE — Telephone Encounter (Signed)
Called and spoke with pt letting him know the results of the HST and he verbalized understanding. Order for cpap start has been placed. Stated to pt to call office after being set up on new machine to schedule follow up appt and he verbalized understanding. Nothing further needed.

## 2021-09-14 ENCOUNTER — Ambulatory Visit (INDEPENDENT_AMBULATORY_CARE_PROVIDER_SITE_OTHER): Payer: BC Managed Care – PPO

## 2021-09-14 ENCOUNTER — Encounter: Payer: Self-pay | Admitting: Cardiology

## 2021-09-14 DIAGNOSIS — I442 Atrioventricular block, complete: Secondary | ICD-10-CM

## 2021-09-14 DIAGNOSIS — Z950 Presence of cardiac pacemaker: Secondary | ICD-10-CM

## 2021-09-14 DIAGNOSIS — R55 Syncope and collapse: Secondary | ICD-10-CM

## 2021-09-14 LAB — CUP PACEART INCLINIC DEVICE CHECK
Battery Remaining Longevity: 179 mo
Battery Voltage: 3.22 V
Brady Statistic AP VP Percent: 0.04 %
Brady Statistic AP VS Percent: 15 %
Brady Statistic AS VP Percent: 0.03 %
Brady Statistic AS VS Percent: 84.93 %
Brady Statistic RA Percent Paced: 15.04 %
Brady Statistic RV Percent Paced: 0.07 %
Date Time Interrogation Session: 20230726152600
Implantable Lead Implant Date: 20230713
Implantable Lead Implant Date: 20230713
Implantable Lead Location: 753859
Implantable Lead Location: 753860
Implantable Lead Model: 3830
Implantable Lead Model: 5076
Implantable Pulse Generator Implant Date: 20230713
Lead Channel Impedance Value: 437 Ohm
Lead Channel Impedance Value: 456 Ohm
Lead Channel Impedance Value: 570 Ohm
Lead Channel Impedance Value: 646 Ohm
Lead Channel Pacing Threshold Amplitude: 0.625 V
Lead Channel Pacing Threshold Amplitude: 0.625 V
Lead Channel Pacing Threshold Pulse Width: 0.4 ms
Lead Channel Pacing Threshold Pulse Width: 0.4 ms
Lead Channel Sensing Intrinsic Amplitude: 1.75 mV
Lead Channel Sensing Intrinsic Amplitude: 14.375 mV
Lead Channel Sensing Intrinsic Amplitude: 15.5 mV
Lead Channel Sensing Intrinsic Amplitude: 2.375 mV
Lead Channel Setting Pacing Amplitude: 3.5 V
Lead Channel Setting Pacing Amplitude: 3.5 V
Lead Channel Setting Pacing Pulse Width: 0.4 ms
Lead Channel Setting Sensing Sensitivity: 1.2 mV

## 2021-09-14 NOTE — Patient Instructions (Signed)
   After Your Pacemaker   Monitor your pacemaker site for redness, swelling, and drainage. Call the device clinic at [phone] if you experience these symptoms or fever/chills.  Your incision was closed with Steri-strips or staples:  You may shower 7 days after your procedure and wash your incision with soap and water. Avoid lotions, ointments, or perfumes over your incision until it is well-healed.  You may use a hot tub or a pool after your wound check appointment if the incision is completely closed.  Do not lift, push or pull greater than 10 pounds with the affected arm until 6 weeks after your procedure. There are no other restrictions in arm movement after your wound check appointment. UNTIL AFTER AUGUST 24TH  You may drive, unless driving has been restricted by your healthcare providers.  Your Pacemaker is MRI compatible.  Remote monitoring is used to monitor your pacemaker from home. This monitoring is scheduled every 91 days by our office. It allows [place] to keep an eye on the functioning of your device to ensure it is working properly. You will routinely see your Electrophysiologist annually (more often if necessary).

## 2021-09-14 NOTE — Progress Notes (Signed)
Wound check appointment. Steri-strips removed. Wound without redness or edema. Incision edges approximated, wound well healed. Normal device function. Thresholds, sensing, and impedances consistent with implant measurements. Device programmed at 3.5V/auto capture programmed on for extra safety margin until 3 month visit. Histogram distribution appropriate for patient and level of activity. AT/AF burden <0.1%. No high ventricular rates noted.  Educated about wound care, arm mobility, lifting restrictions. ROV in 3 months with implanting physician.

## 2021-09-26 ENCOUNTER — Telehealth (HOSPITAL_COMMUNITY): Payer: Self-pay

## 2021-09-27 ENCOUNTER — Inpatient Hospital Stay (HOSPITAL_COMMUNITY): Admission: RE | Admit: 2021-09-27 | Payer: BC Managed Care – PPO | Source: Ambulatory Visit

## 2021-09-27 ENCOUNTER — Encounter (HOSPITAL_COMMUNITY): Payer: Self-pay

## 2021-09-27 ENCOUNTER — Encounter (HOSPITAL_COMMUNITY)
Admission: RE | Admit: 2021-09-27 | Discharge: 2021-09-27 | Disposition: A | Discharge status: Home / Self Care | Payer: BC Managed Care – PPO | Source: Ambulatory Visit | Attending: Cardiology | Admitting: Cardiology

## 2021-09-27 ENCOUNTER — Telehealth (HOSPITAL_COMMUNITY): Payer: Self-pay | Admitting: *Deleted

## 2021-09-27 VITALS — BP 104/72 | HR 85 | Ht 65.75 in | Wt 191.8 lb

## 2021-09-27 DIAGNOSIS — I480 Paroxysmal atrial fibrillation: Secondary | ICD-10-CM | POA: Diagnosis not present

## 2021-09-27 DIAGNOSIS — I472 Ventricular tachycardia, unspecified: Secondary | ICD-10-CM | POA: Diagnosis not present

## 2021-09-27 DIAGNOSIS — I6523 Occlusion and stenosis of bilateral carotid arteries: Secondary | ICD-10-CM | POA: Insufficient documentation

## 2021-09-27 DIAGNOSIS — R0602 Shortness of breath: Secondary | ICD-10-CM | POA: Diagnosis present

## 2021-09-27 DIAGNOSIS — Z8679 Personal history of other diseases of the circulatory system: Secondary | ICD-10-CM | POA: Diagnosis not present

## 2021-09-27 DIAGNOSIS — Z955 Presence of coronary angioplasty implant and graft: Secondary | ICD-10-CM | POA: Diagnosis not present

## 2021-09-27 DIAGNOSIS — I2510 Atherosclerotic heart disease of native coronary artery without angina pectoris: Secondary | ICD-10-CM | POA: Insufficient documentation

## 2021-09-27 DIAGNOSIS — R002 Palpitations: Secondary | ICD-10-CM | POA: Insufficient documentation

## 2021-09-27 NOTE — Telephone Encounter (Signed)
-----   Message from Will Jorja Loa, MD sent at 09/27/2021  2:36 PM EDT ----- Regarding: RE: Cardiac Rehab He has left arm restrictions for a total of 6 weeks after his pacemaker implant.  He should know his restrictions.  He is okay with proceeding with rehab if he is compliant with those restrictions ----- Message ----- From: Cammy Copa, RN Sent: 09/27/2021  12:56 PM EDT To: Regan Lemming, MD Subject: Cardiac Rehab                                  Good afternoon Dr Laney Pastor is here for cardiac rehab orientation. Checking to see if you are okay with Caryn Bee proceeding with exercise on Monday 10/03/21. Brandon Madden is s/p pacemaker implant/ loop recorder removal on 09/01/21.  I appreciate your input!  Sincerely, Gladstone Lighter RN Cardiac Rehab

## 2021-09-27 NOTE — Progress Notes (Signed)
Cardiac Rehab Medication Review by a Nurse  Does the patient  feel that his/her medications are working for him/her?  YES   Has the patient been experiencing any side effects to the medications prescribed?  YES   Does the patient measure his/her own blood pressure or blood glucose at home?  YES   Does the patient have any problems obtaining medications due to transportation or finances?    NO  Understanding of regimen: excellent Understanding of indications: excellent Potential of compliance: excellent    Nurse comments: Brandon Madden is taking his medications as prescribed. Brandon Madden checks his blood pressures 3-4 times a day.    Arta Bruce Ameen Mostafa RN 09/27/2021 3:23 PM

## 2021-09-27 NOTE — Progress Notes (Signed)
Cardiac Individual Treatment Plan  Patient Details  Name: Brandon Madden MRN: [id number] Date of Birth: 30-Mar-1960 Referring Provider:   Flowsheet Row INTENSIVE CARDIAC REHAB [facility]  Referring Provider Meriam SpraguePemberton, Heather E, MD       Initial Encounter Date:  Flowsheet Row INTENSIVE CARDIAC REHAB [facility]  Date 09/27/21       Visit Diagnosis: 07/15/21 DES LAD  Patient's Home Medications on Admission:  Current Outpatient Medications:    acetaminophen (TYLENOL) 500 MG tablet, Take 500-1,000 mg by mouth every 6 (six) hours as needed (pain.)., Disp: , Rfl:    aspirin EC 81 MG tablet, Take 1 tablet (81 mg total) by mouth daily., Disp: 90 tablet, Rfl: 3   aspirin-acetaminophen-caffeine (EXCEDRIN MIGRAINE) 250-250-65 MG tablet, Take 1-2 tablets by mouth 2 (two) times daily as needed for headache., Disp: , Rfl:    clopidogrel (PLAVIX) 75 MG tablet, Take 1 tablet (75 mg total) by mouth daily., Disp: 90 tablet, Rfl: 1   diltiazem (CARDIZEM) 30 MG tablet, Take 1 tablet (30 mg total) by mouth 4 (four) times daily as needed., Disp: 30 tablet, Rfl: 3   ibuprofen (ADVIL) 200 MG tablet, Take 400-600 mg by mouth every 8 (eight) hours as needed (pain.)., Disp: , Rfl:    metoprolol succinate (TOPROL XL) 25 MG 24 hr tablet, Take 0.5 tablets (12.5 mg total) by mouth daily. X 2 days than discontinue on June 19,2023. (Patient not taking: Reported on 08/31/2021), Disp: 90 tablet, Rfl: 3   Multiple Vitamins-Minerals (MULTI FOR HIM) TABS, Take 1 tablet by mouth in the morning., Disp: , Rfl:    nitroGLYCERIN (NITROSTAT) 0.4 MG SL tablet, Place 1 tablet (0.4 mg total) under the tongue every 5 (five) minutes as needed for chest pain., Disp: 90 tablet, Rfl: 3   Polyethyl Glycol-Propyl Glycol (LUBRICANT EYE DROPS) 0.4-0.3 % SOLN, Place 1-2 drops into both eyes 3 (three) times daily as needed  (dry/irritated eyes.)., Disp: , Rfl:    rosuvastatin (CRESTOR) 20 MG tablet, Take 2 tablets (40 mg total) by mouth daily., Disp: 180 tablet, Rfl: 3  Past Medical History: Past Medical History:  Diagnosis Date   CAD (coronary artery disease) 07/13/2021   CCTA 06/2021: CAC score 260 (84th percentile); mLAD 70-99; mLAD FFR pos (0.60) Status post DES to the mid LAD 06/2021   Carotid stenosis 08/11/2021   Carotid US 07/2021: Bilat ICA 1-39   Deafness in right ear    Family history of pancreatic cancer    Family history of prostate cancer    Family history of skin cancer    Hyperlipidemia LDL goal <70 08/20/2018   PAF (paroxysmal atrial fibrillation) (HCC) 05/06/2021   Sleep apnea     Tobacco Use: Social History   Tobacco Use  Smoking Status Never  Smokeless Tobacco Never    Labs: Review Flowsheet  More data exists      Latest Ref Rng & Units 03/30/2020 06/03/2020 08/10/2020 09/30/2020 05/06/2021  Labs for ITP Cardiac and Pulmonary Rehab  Cholestrol 100 - 199 mg/dL 191245  [id number]  295237  [id number]  -  LDL (calc) 0 - 99 mg/dL 308170  657117  846172  962110  -  HDL-C >39 mg/dL 95.2851.50  52  50  45  -  Trlycerides 0 - 149 mg/dL 413.2121.0  440102  87  83  -  Hemoglobin A1c 4.6 - 6.5 % 5.5  - - -  5.9     Capillary Blood Glucose: No results found for: "GLUCAP"   Exercise Target Goals: Exercise Program Goal: Individual exercise prescription set using results from initial 6 min walk test and THRR while considering  patient's activity barriers and safety.   Exercise Prescription Goal: Initial exercise prescription builds to 30-45 minutes a day of aerobic activity, 2-3 days per week.  Home exercise guidelines will be given to patient during program as part of exercise prescription that the participant will acknowledge.  Activity Barriers & Risk Stratification:  Activity Barriers & Cardiac Risk Stratification - 09/27/21 1210       Activity Barriers & Cardiac Risk Stratification   Activity Barriers Other (comment);Balance  Concerns    Comments PPM implantation on 09/01/21. Lifiting restriction less than 10 lbs through 10/10/21. Prior to PPM implantation, pt experienced aura, syncopal episode, effected balance    Cardiac Risk Stratification High             6 Minute Walk:  6 Minute Walk     Row Name 09/27/21 1200         6 Minute Walk   Phase Initial     Distance 1865 feet     Walk Time 6 minutes     # of Rest Breaks 0     MPH 3.53     METS 4.12     RPE 11     Perceived Dyspnea  0     VO2 Peak 14.44     Symptoms No     Resting HR 85 bpm     Resting BP 104/72     Resting Oxygen Saturation  97 %     Exercise Oxygen Saturation  during 6 min walk 99 %     Max Ex. HR 103 bpm     Max Ex. BP 110/74     2 Minute Post BP 104/76              Oxygen Initial Assessment:   Oxygen Re-Evaluation:   Oxygen Discharge (Final Oxygen Re-Evaluation):   Initial Exercise Prescription:  Initial Exercise Prescription - 09/27/21 1200       Date of Initial Exercise RX and Referring Provider   Date 09/27/21    Referring Provider [name], MD    Expected Discharge Date 12/02/21      Bike   Level 2.5    Minutes 15    METs 3.5      NuStep   Level 3    SPM 85    Minutes 15    METs 3.5      Prescription Details   Frequency (times per week) 3    Duration Progress to 30 minutes of continuous aerobic without signs/symptoms of physical distress      Intensity   THRR 40-80% of Max Heartrate 64-129    Ratings of Perceived Exertion 11-13    Perceived Dyspnea 0-4      Progression   Progression Continue to progress workloads to maintain intensity without signs/symptoms of physical distress.      Resistance Training   Training Prescription Yes    Weight 4 lbs    Reps 10-15             Perform Capillary Blood Glucose checks as needed.  Exercise Prescription Changes:   Exercise Comments:   Exercise Goals and Review:   Exercise Goals     Row Name 09/27/21 1147  Exercise Goals   Increase Physical Activity Yes       Intervention Provide advice, education, support and counseling about physical activity/exercise needs.;Develop an individualized exercise prescription for aerobic and resistive training based on initial evaluation findings, risk stratification, comorbidities and participant's personal goals.       Expected Outcomes Short Term: Attend rehab on a regular basis to increase amount of physical activity.;Long Term: Exercising regularly at least 3-5 days a week.;Long Term: Add in home exercise to make exercise part of routine and to increase amount of physical activity.       Increase Strength and Stamina Yes       Intervention Provide advice, education, support and counseling about physical activity/exercise needs.;Develop an individualized exercise prescription for aerobic and resistive training based on initial evaluation findings, risk stratification, comorbidities and participant's personal goals.       Expected Outcomes Short Term: Increase workloads from initial exercise prescription for resistance, speed, and METs.;Short Term: Perform resistance training exercises routinely during rehab and add in resistance training at home;Long Term: Improve cardiorespiratory fitness, muscular endurance and strength as measured by increased METs and functional capacity ( )       Able to understand and use rate of perceived exertion (RPE) scale Yes       Intervention Provide education and explanation on how to use RPE scale       Expected Outcomes Short Term: Able to use RPE daily in rehab to express subjective intensity level;Long Term:  Able to use RPE to guide intensity level when exercising independently       Knowledge and understanding of Target Heart Rate Range (THRR) Yes       Intervention Provide education and explanation of THRR including how the numbers were predicted and where they are located for reference       Expected Outcomes Short  Term: Able to state/look up THRR;Long Term: Able to use THRR to govern intensity when exercising independently;Short Term: Able to use daily as guideline for intensity in rehab       Able to check pulse independently Yes       Intervention Provide education and demonstration on how to check pulse in carotid and radial arteries.;Review the importance of being able to check your own pulse for safety during independent exercise       Expected Outcomes Short Term: Able to explain why pulse checking is important during independent exercise;Long Term: Able to check pulse independently and accurately       Understanding of Exercise Prescription Yes       Intervention Provide education, explanation, and written materials on patient's individual exercise prescription       Expected Outcomes Short Term: Able to explain program exercise prescription;Long Term: Able to explain home exercise prescription to exercise independently                Exercise Goals Re-Evaluation :   Discharge Exercise Prescription (Final Exercise Prescription Changes):   Nutrition:  Target Goals: Understanding of nutrition guidelines, daily intake of sodium 1500mg , cholesterol 200mg , calories 30% from fat and 7% or less from saturated fats, daily to have 5 or more servings of fruits and vegetables.  Biometrics:  Pre Biometrics - 09/27/21 1140       Pre Biometrics   Waist Circumference 41.5 inches    Hip Circumference 42.25 inches    Waist to Hip Ratio 0.98 %    Triceps Skinfold 19 mm    % Body Fat 30.5 %  Grip Strength 34 kg    Flexibility 14 in    Single Leg Stand 30 seconds              Nutrition Therapy Plan and Nutrition Goals:   Nutrition Assessments:  MEDIFICTS Score Key: ?70 Need to make dietary changes  40-70 Heart Healthy Diet ? 40 Therapeutic Level Cholesterol Diet    Picture Your Plate Scores: <16 Unhealthy dietary pattern with much room for improvement. 41-50 Dietary pattern  unlikely to meet recommendations for good health and room for improvement. 51-60 More healthful dietary pattern, with some room for improvement.  >60 Healthy dietary pattern, although there may be some specific behaviors that could be improved.    Nutrition Goals Re-Evaluation:   Nutrition Goals Re-Evaluation:   Nutrition Goals Discharge (Final Nutrition Goals Re-Evaluation):   Psychosocial: Target Goals: Acknowledge presence or absence of significant depression and/or stress, maximize coping skills, provide positive support system. Participant is able to verbalize types and ability to use techniques and skills needed for reducing stress and depression.  Initial Review & Psychosocial Screening:  Initial Psych Review & Screening - 09/27/21 1515       Initial Review   Current issues with Current Stress Concerns    Source of Stress Concerns Chronic Illness    Comments Holdan is concerned about his recent cardiac events      Family Dynamics   Good Support System? Yes   Fairley has wife, children and extended family and friends for support     Barriers   Psychosocial barriers to participate in program The patient should benefit from training in stress management and relaxation.      Screening Interventions   Interventions Encouraged to exercise;To provide support and resources with identified psychosocial needs    Expected Outcomes Long Term Goal: Stressors or current issues are controlled or eliminated.             Quality of Life Scores:  Quality of Life - 09/27/21 1312       Quality of Life   Select Quality of Life      Quality of Life Scores   Health/Function Pre 20.33 %    Socioeconomic Pre 22.36 %    Psych/Spiritual Pre 22.64 %    Family Pre 24 %    GLOBAL Pre 21.76 %            Scores of 19 and below usually indicate a poorer quality of life in these areas.  A difference of  2-3 points is a clinically meaningful difference.  A difference of 2-3 points in  the total score of the Quality of Life Index has been associated with significant improvement in overall quality of life, self-image, physical symptoms, and general health in studies assessing change in quality of life.  PHQ-9: Review Flowsheet       09/27/2021 05/06/2021 03/30/2020  Depression screen PHQ 2/9  Decreased Interest 0 0 0  Down, Depressed, Hopeless 0 0 0  PHQ - 2 Score 0 0 0   Interpretation of Total Score  Total Score Depression Severity:  1-4 = Minimal depression, 5-9 = Mild depression, 10-14 = Moderate depression, 15-19 = Moderately severe depression, 20-27 = Severe depression   Psychosocial Evaluation and Intervention:   Psychosocial Re-Evaluation:   Psychosocial Discharge (Final Psychosocial Re-Evaluation):   Vocational Rehabilitation: Provide vocational rehab assistance to qualifying candidates.   Vocational Rehab Evaluation & Intervention:  Vocational Rehab - 09/27/21 1519       Initial Vocational  Rehab Evaluation & Intervention   Assessment shows need for Vocational Rehabilitation No   Kolby works full time at a [employer] and does not need vocational rehab at this time            Education: Education Goals: Education classes will be provided on a weekly basis, covering required topics. Participant will state understanding/return demonstration of topics presented.     Core Videos: Exercise    Move It!  Clinical staff conducted group or individual video education with verbal and written material and guidebook.  Patient learns the recommended Pritikin exercise program. Exercise with the goal of living a long, healthy life. Some of the health benefits of exercise include controlled diabetes, healthier blood pressure levels, improved cholesterol levels, improved heart and lung capacity, improved sleep, and better body composition. Everyone should speak with their doctor before starting or changing an exercise routine.  Biomechanical Limitations Clinical  staff conducted group or individual video education with verbal and written material and guidebook.  Patient learns how biomechanical limitations can impact exercise and how we can mitigate and possibly overcome limitations to have an impactful and balanced exercise routine.  Body Composition Clinical staff conducted group or individual video education with verbal and written material and guidebook.  Patient learns that body composition (ratio of muscle mass to fat mass) is a key component to assessing overall fitness, rather than body weight alone. Increased fat mass, especially visceral belly fat, can put [place] at increased risk for metabolic syndrome, type 2 diabetes, heart disease, and even death. It is recommended to combine diet and exercise (cardiovascular and resistance training) to improve your body composition. Seek guidance from your physician and exercise physiologist before implementing an exercise routine.  Exercise Action Plan Clinical staff conducted group or individual video education with verbal and written material and guidebook.  Patient learns the recommended strategies to achieve and enjoy long-term exercise adherence, including variety, self-motivation, self-efficacy, and positive decision making. Benefits of exercise include fitness, good health, weight management, more energy, better sleep, less stress, and overall well-being.  Medical   Heart Disease Risk Reduction Clinical staff conducted group or individual video education with verbal and written material and guidebook.  Patient learns our heart is our most vital organ as it circulates oxygen, nutrients, white blood cells, and hormones throughout the entire body, and carries waste away. Data supports a plant-based eating plan like the Pritikin Program for its effectiveness in slowing progression of and reversing heart disease. The video provides a number of recommendations to address heart disease.   Metabolic Syndrome and  Belly Fat  Clinical staff conducted group or individual video education with verbal and written material and guidebook.  Patient learns what metabolic syndrome is, how it leads to heart disease, and how one can reverse it and keep it from coming back. You have metabolic syndrome if you have 3 of the following 5 criteria: abdominal obesity, high blood pressure, high triglycerides, low HDL cholesterol, and high blood sugar.  Hypertension and Heart Disease Clinical staff conducted group or individual video education with verbal and written material and guidebook.  Patient learns that high blood pressure, or hypertension, is very common in the [place]. Hypertension is largely due to excessive salt intake, but other important risk factors include being overweight, physical inactivity, drinking too much alcohol, smoking, and not eating enough potassium from fruits and vegetables. High blood pressure is a leading risk factor for heart attack, stroke, congestive heart failure, dementia, kidney failure, and premature  death. Long-term effects of excessive salt intake include stiffening of the arteries and thickening of heart muscle and organ damage. Recommendations include ways to reduce hypertension and the risk of heart disease.  Diseases of Our Time - Focusing on Diabetes Clinical staff conducted group or individual video education with verbal and written material and guidebook.  Patient learns why the best way to stop diseases of our time is prevention, through food and other lifestyle changes. Medicine (such as prescription pills and surgeries) is often only a Band-Aid on the problem, not a long-term solution. Most common diseases of our time include obesity, type 2 diabetes, hypertension, heart disease, and cancer. The Pritikin Program is recommended and has been proven to help reduce, reverse, and/or prevent the damaging effects of metabolic syndrome.  Nutrition   Overview of the Pritikin Eating Plan   Clinical staff conducted group or individual video education with verbal and written material and guidebook.  Patient learns about the Pritikin Eating Plan for disease risk reduction. The Pritikin Eating Plan emphasizes a wide variety of unrefined, minimally-processed carbohydrates, like fruits, vegetables, whole grains, and legumes. Go, Caution, and Stop food choices are explained. Plant-based and lean animal proteins are emphasized. Rationale provided for low sodium intake for blood pressure control, low added sugars for blood sugar stabilization, and low added fats and oils for coronary artery disease risk reduction and weight management.  Calorie Density  Clinical staff conducted group or individual video education with verbal and written material and guidebook.  Patient learns about calorie density and how it impacts the Pritikin Eating Plan. Knowing the characteristics of the food you choose will help you decide whether those foods will lead to weight gain or weight loss, and whether you want to consume more or less of them. Weight loss is usually a side effect of the Pritikin Eating Plan because of its focus on low calorie-dense foods.  Label Reading  Clinical staff conducted group or individual video education with verbal and written material and guidebook.  Patient learns about the Pritikin recommended label reading guidelines and corresponding recommendations regarding calorie density, added sugars, sodium content, and whole grains.  Dining Out - Part 1  Clinical staff conducted group or individual video education with verbal and written material and guidebook.  Patient learns that restaurant meals can be sabotaging because they can be so high in calories, fat, sodium, and/or sugar. Patient learns recommended strategies on how to positively address this and avoid unhealthy pitfalls.  Facts on Fats  Clinical staff conducted group or individual video education with verbal and written  material and guidebook.  Patient learns that lifestyle modifications can be just as effective, if not more so, as many medications for lowering your risk of heart disease. A Pritikin lifestyle can help to reduce your risk of inflammation and atherosclerosis (cholesterol build-up, or plaque, in the artery walls). Lifestyle interventions such as dietary choices and physical activity address the cause of atherosclerosis. A review of the types of fats and their impact on blood cholesterol levels, along with dietary recommendations to reduce fat intake is also included.  Nutrition Action Plan  Clinical staff conducted group or individual video education with verbal and written material and guidebook.  Patient learns how to incorporate Pritikin recommendations into their lifestyle. Recommendations include planning and keeping personal health goals in mind as an important part of their success.  Healthy Mind-Set    Healthy Minds, Bodies, Hearts  Clinical staff conducted group or individual video education with verbal and  written material and guidebook.  Patient learns how to identify when they are stressed. Video will discuss the impact of that stress, as well as the many benefits of stress management. Patient will also be introduced to stress management techniques. The way we think, act, and feel has an impact on our hearts.  How Our Thoughts Can Heal Our Hearts  Clinical staff conducted group or individual video education with verbal and written material and guidebook.  Patient learns that negative thoughts can cause depression and anxiety. This can result in negative lifestyle behavior and serious health problems. Cognitive behavioral therapy is an effective method to help control our thoughts in order to change and improve our emotional outlook.  Additional Videos:  Exercise    Improving Performance  Clinical staff conducted group or individual video education with verbal and written material and  guidebook.  Patient learns to use a non-linear approach by alternating intensity levels and lengths of time spent exercising to help burn more calories and lose more body fat. Cardiovascular exercise helps improve heart health, metabolism, hormonal balance, blood sugar control, and recovery from fatigue. Resistance training improves strength, endurance, balance, coordination, reaction time, metabolism, and muscle mass. Flexibility exercise improves circulation, posture, and balance. Seek guidance from your physician and exercise physiologist before implementing an exercise routine and learn your capabilities and proper form for all exercise.  Introduction to Yoga  Clinical staff conducted group or individual video education with verbal and written material and guidebook.  Patient learns about yoga, a discipline of the coming together of mind, breath, and body. The benefits of yoga include improved flexibility, improved range of motion, better posture and core strength, increased lung function, weight loss, and positive self-image. Yoga's heart health benefits include lowered blood pressure, healthier heart rate, decreased cholesterol and triglyceride levels, improved immune function, and reduced stress. Seek guidance from your physician and exercise physiologist before implementing an exercise routine and learn your capabilities and proper form for all exercise.  Medical   Aging: Enhancing Your Quality of Life  Clinical staff conducted group or individual video education with verbal and written material and guidebook.  Patient learns key strategies and recommendations to stay in good physical health and enhance quality of life, such as prevention strategies, having an advocate, securing a Health Care Proxy and Power of Attorney, and keeping a list of medications and system for tracking them. It also discusses how to avoid risk for bone loss.  Biology of Weight Control  Clinical staff conducted group or  individual video education with verbal and written material and guidebook.  Patient learns that weight gain occurs because we consume more calories than we burn (eating more, moving less). Even if your body weight is normal, you may have higher ratios of fat compared to muscle mass. Too much body fat puts you at increased risk for cardiovascular disease, heart attack, stroke, type 2 diabetes, and obesity-related cancers. In addition to exercise, following the Pritikin Eating Plan can help reduce your risk.  Decoding Lab Results  Clinical staff conducted group or individual video education with verbal and written material and guidebook.  Patient learns that lab test reflects one measurement whose values change over time and are influenced by many factors, including medication, stress, sleep, exercise, food, hydration, pre-existing medical conditions, and more. It is recommended to use the knowledge from this video to become more involved with your lab results and evaluate your numbers to speak with your doctor.   Diseases of Our Time -  Overview  Clinical staff conducted group or individual video education with verbal and written material and guidebook.  Patient learns that according to the CDC, 50% to 70% of chronic diseases (such as obesity, type 2 diabetes, elevated lipids, hypertension, and heart disease) are avoidable through lifestyle improvements including healthier food choices, listening to satiety cues, and increased physical activity.  Sleep Disorders Clinical staff conducted group or individual video education with verbal and written material and guidebook.  Patient learns how good quality and duration of sleep are important to overall health and well-being. Patient also learns about sleep disorders and how they impact health along with recommendations to address them, including discussing with a physician.  Nutrition  Dining Out - Part 2 Clinical staff conducted group or individual video  education with verbal and written material and guidebook.  Patient learns how to plan ahead and communicate in order to maximize their dining experience in a healthy and nutritious manner. Included are recommended food choices based on the type of restaurant the patient is visiting.   Fueling a Banker conducted group or individual video education with verbal and written material and guidebook.  There is a strong connection between our food choices and our health. Diseases like obesity and type 2 diabetes are very prevalent and are in large-part due to lifestyle choices. The Pritikin Eating Plan provides plenty of food and hunger-curbing satisfaction. It is easy to follow, affordable, and helps reduce health risks.  Menu Workshop  Clinical staff conducted group or individual video education with verbal and written material and guidebook.  Patient learns that restaurant meals can sabotage health goals because they are often packed with calories, fat, sodium, and sugar. Recommendations include strategies to plan ahead and to communicate with the manager, chef, or server to help order a healthier meal.  Planning Your Eating Strategy  Clinical staff conducted group or individual video education with verbal and written material and guidebook.  Patient learns about the Pritikin Eating Plan and its benefit of reducing the risk of disease. The Pritikin Eating Plan does not focus on calories. Instead, it emphasizes high-quality, nutrient-rich foods. By knowing the characteristics of the foods, we choose, we can determine their calorie density and make informed decisions.  Targeting Your Nutrition Priorities  Clinical staff conducted group or individual video education with verbal and written material and guidebook.  Patient learns that lifestyle habits have a tremendous impact on disease risk and progression. This video provides eating and physical activity recommendations based on your  personal health goals, such as reducing LDL cholesterol, losing weight, preventing or controlling type 2 diabetes, and reducing high blood pressure.  Vitamins and Minerals  Clinical staff conducted group or individual video education with verbal and written material and guidebook.  Patient learns different ways to obtain key vitamins and minerals, including through a recommended healthy diet. It is important to discuss all supplements you take with your doctor.   Healthy Mind-Set    Smoking Cessation  Clinical staff conducted group or individual video education with verbal and written material and guidebook.  Patient learns that cigarette smoking and tobacco addiction pose a serious health risk which affects millions of people. Stopping smoking will significantly reduce the risk of heart disease, lung disease, and many forms of cancer. Recommended strategies for quitting are covered, including working with your doctor to develop a successful plan.  Culinary   Becoming a Set designer conducted group or individual video education with verbal  and written material and guidebook.  Patient learns that cooking at home can be healthy, cost-effective, quick, and puts them in control. Keys to cooking healthy recipes will include looking at your recipe, assessing your equipment needs, planning ahead, making it simple, choosing cost-effective seasonal ingredients, and limiting the use of added fats, salts, and sugars.  Cooking - Breakfast and Snacks  Clinical staff conducted group or individual video education with verbal and written material and guidebook.  Patient learns how important breakfast is to satiety and nutrition through the entire day. Recommendations include key foods to eat during breakfast to help stabilize blood sugar levels and to prevent overeating at meals later in the day. Planning ahead is also a key component.  Cooking - Educational psychologist conducted  group or individual video education with verbal and written material and guidebook.  Patient learns eating strategies to improve overall health, including an approach to cook more at home. Recommendations include thinking of animal protein as a side on your plate rather than center stage and focusing instead on lower calorie dense options like vegetables, fruits, whole grains, and plant-based proteins, such as beans. Making sauces in large quantities to freeze for later and leaving the skin on your vegetables are also recommended to maximize your experience.  Cooking - Healthy Salads and Dressing Clinical staff conducted group or individual video education with verbal and written material and guidebook.  Patient learns that vegetables, fruits, whole grains, and legumes are the foundations of the Pritikin Eating Plan. Recommendations include how to incorporate each of these in flavorful and healthy salads, and how to create homemade salad dressings. Proper handling of ingredients is also covered. Cooking - Soups and [employer] - Soups and Desserts Clinical staff conducted group or individual video education with verbal and written material and guidebook.  Patient learns that Pritikin soups and desserts make for easy, nutritious, and delicious snacks and meal components that are low in sodium, fat, sugar, and calorie density, while high in vitamins, minerals, and filling fiber. Recommendations include simple and healthy ideas for soups and desserts.   Overview     The Pritikin Solution Program Overview Clinical staff conducted group or individual video education with verbal and written material and guidebook.  Patient learns that the results of the Pritikin Program have been documented in more than 100 articles published in peer-reviewed journals, and the benefits include reducing risk factors for (and, in some cases, even reversing) high cholesterol, high blood pressure, type 2 diabetes, obesity,  and more! An overview of the three key pillars of the Pritikin Program will be covered: eating well, doing regular exercise, and having a healthy mind-set.  WORKSHOPS  Exercise: Exercise Basics: Building Your Action Plan Clinical staff led group instruction and group discussion with PowerPoint presentation and patient guidebook. To enhance the learning environment the use of posters, models and videos may be added. At the conclusion of this workshop, patients will comprehend the difference between physical activity and exercise, as well as the benefits of incorporating both, into their routine. Patients will understand the FITT (Frequency, Intensity, Time, and Type) principle and how to use it to build an exercise action plan. In addition, safety concerns and other considerations for exercise and cardiac rehab will be addressed by the presenter. The purpose of this lesson is to promote a comprehensive and effective weekly exercise routine in order to improve patients' overall level of fitness.   Managing Heart Disease: Your Path to a Healthier  Heart Clinical staff led group instruction and group discussion with PowerPoint presentation and patient guidebook. To enhance the learning environment the use of posters, models and videos may be added.At the conclusion of this workshop, patients will understand the anatomy and physiology of the heart. Additionally, they will understand how Pritikin's three pillars impact the risk factors, the progression, and the management of heart disease.  The purpose of this lesson is to provide a high-level overview of the heart, heart disease, and how the Pritikin lifestyle positively impacts risk factors.  Exercise Biomechanics Clinical staff led group instruction and group discussion with PowerPoint presentation and patient guidebook. To enhance the learning environment the use of posters, models and videos may be added. Patients will learn how the structural  parts of their bodies function and how these functions impact their daily activities, movement, and exercise. Patients will learn how to promote a neutral spine, learn how to manage pain, and identify ways to improve their physical movement in order to promote healthy living. The purpose of this lesson is to expose patients to common physical limitations that impact physical activity. Participants will learn practical ways to adapt and manage aches and pains, and to minimize their effect on regular exercise. Patients will learn how to maintain good posture while sitting, walking, and lifting.  Balance Training and Fall Prevention  Clinical staff led group instruction and group discussion with PowerPoint presentation and patient guidebook. To enhance the learning environment the use of posters, models and videos may be added. At the conclusion of this workshop, patients will understand the importance of their sensorimotor skills (vision, proprioception, and the vestibular system) in maintaining their ability to balance as they age. Patients will apply a variety of balancing exercises that are appropriate for their current level of function. Patients will understand the common causes for poor balance, possible solutions to these problems, and ways to modify their physical environment in order to minimize their fall risk. The purpose of this lesson is to teach patients about the importance of maintaining balance as they age and ways to minimize their risk of falling.  WORKSHOPS   Nutrition:  Fueling a Ship broker led group instruction and group discussion with PowerPoint presentation and patient guidebook. To enhance the learning environment the use of posters, models and videos may be added. Patients will review the foundational principles of the Pritikin Eating Plan and understand what constitutes a serving size in each of the food groups. Patients will also learn Pritikin-friendly  foods that are better choices when away from home and review make-ahead meal and snack options. Calorie density will be reviewed and applied to three nutrition priorities: weight maintenance, weight loss, and weight gain. The purpose of this lesson is to reinforce (in a group setting) the key concepts around what patients are recommended to eat and how to apply these guidelines when away from home by planning and selecting Pritikin-friendly options. Patients will understand how calorie density may be adjusted for different weight management goals.  Mindful Eating  Clinical staff led group instruction and group discussion with PowerPoint presentation and patient guidebook. To enhance the learning environment the use of posters, models and videos may be added. Patients will briefly review the concepts of the Pritikin Eating Plan and the importance of low-calorie dense foods. The concept of mindful eating will be introduced as well as the importance of paying attention to internal hunger signals. Triggers for non-hunger eating and techniques for dealing with triggers will be explored.  The purpose of this lesson is to provide patients with the opportunity to review the basic principles of the Pritikin Eating Plan, discuss the value of eating mindfully and how to measure internal cues of hunger and fullness using the Hunger Scale. Patients will also discuss reasons for non-hunger eating and learn strategies to use for controlling emotional eating.  Targeting Your Nutrition Priorities Clinical staff led group instruction and group discussion with PowerPoint presentation and patient guidebook. To enhance the learning environment the use of posters, models and videos may be added. Patients will learn how to determine their genetic susceptibility to disease by reviewing their family history. Patients will gain insight into the importance of diet as part of an overall healthy lifestyle in mitigating the impact of  genetics and other environmental insults. The purpose of this lesson is to provide patients with the opportunity to assess their personal nutrition priorities by looking at their family history, their own health history and current risk factors. Patients will also be able to discuss ways of prioritizing and modifying the Pritikin Eating Plan for their highest risk areas  Menu  Clinical staff led group instruction and group discussion with PowerPoint presentation and patient guidebook. To enhance the learning environment the use of posters, models and videos may be added. Using menus brought in from [employer], or printed from [employer], patients will apply the Pritikin dining out guidelines that were presented in the [employer] video. Patients will also be able to practice these guidelines in a variety of provided scenarios. The purpose of this lesson is to provide patients with the opportunity to practice hands-on learning of the Pritikin Dining Out guidelines with actual menus and practice scenarios.  Label Reading Clinical staff led group instruction and group discussion with PowerPoint presentation and patient guidebook. To enhance the learning environment the use of posters, models and videos may be added. Patients will review and discuss the Pritikin label reading guidelines presented in Pritikin's Label Reading Educational series video. Using fool labels brought in from local grocery stores and markets, patients will apply the label reading guidelines and determine if the packaged food meet the Pritikin guidelines. The purpose of this lesson is to provide patients with the opportunity to review, discuss, and practice hands-on learning of the Pritikin Label Reading guidelines with actual packaged food labels. Cooking School  Pritikin's [employer] are designed to teach patients ways to prepare quick, simple, and affordable recipes at home. The importance of  nutrition's role in chronic disease risk reduction is reflected in its emphasis in the overall Pritikin program. By learning how to prepare essential core Pritikin Eating Plan recipes, patients will increase control over what they eat; be able to customize the flavor of foods without the use of added salt, sugar, or fat; and improve the quality of the food they consume. By learning a set of core recipes which are easily assembled, quickly prepared, and affordable, patients are more likely to prepare more healthy foods at home. These workshops focus on convenient breakfasts, simple entres, side dishes, and desserts which can be prepared with minimal effort and are consistent with nutrition recommendations for cardiovascular risk reduction. Cooking [employer] are taught by a Armed forces logistics/support/administrative officer (RD) who has been trained by the [employer]. The chef or RD has a clear understanding of the importance of minimizing - if not completely eliminating - added fat, sugar, and sodium in recipes. Throughout the series of [employer]  sessions, patients will learn about healthy ingredients and efficient methods of cooking to build confidence in their capability to prepare    Cooking School weekly topics:  Adding Flavor- Sodium-Free  Fast and Healthy Breakfasts  Powerhouse Plant-Based Proteins  Satisfying Salads and Dressings  Simple Sides and Sauces  International Cuisine-Spotlight on the Blue Zones  Delicious Desserts  Savory Soups  Efficiency Cooking - Meals in a Snap  Tasty Appetizers and Snacks  Comforting Weekend Breakfasts  One-Pot Wonders   Fast Evening Meals  Landscape architect Your Pritikin Plate  WORKSHOPS   Healthy Mindset (Psychosocial): New Thoughts, New Behaviors Clinical staff led group instruction and group discussion with PowerPoint presentation and patient guidebook. To enhance the learning environment the use of posters, models and  videos may be added. Patients will learn and practice techniques for developing effective health and lifestyle goals. Patients will be able to effectively apply the goal setting process learned to develop at least one new personal goal.  The purpose of this lesson is to expose patients to a new skill set of behavior modification techniques such as techniques setting SMART goals, overcoming barriers, and achieving new thoughts and new behaviors.  Managing Moods and Relationships Clinical staff led group instruction and group discussion with PowerPoint presentation and patient guidebook. To enhance the learning environment the use of posters, models and videos may be added. Patients will learn how emotional and chronic stress factors can impact their health and relationships. They will learn healthy ways to manage their moods and utilize positive coping mechanisms. In addition, ICR patients will learn ways to improve communication skills. The purpose of this lesson is to expose patients to ways of understanding how one's mood and health are intimately connected. Developing a healthy outlook can help build positive relationships and connections with others. Patients will understand the importance of utilizing effective communication skills that include actively listening and being heard. They will learn and understand the importance of the "4 Cs" and especially Connections in fostering of a Healthy Mind-Set.  Healthy Sleep for a Healthy Heart Clinical staff led group instruction and group discussion with PowerPoint presentation and patient guidebook. To enhance the learning environment the use of posters, models and videos may be added. At the conclusion of this workshop, patients will be able to demonstrate knowledge of the importance of sleep to overall health, well-being, and quality of life. They will understand the symptoms of, and treatments for, common sleep disorders. Patients will also be able to  identify daytime and nighttime behaviors which impact sleep, and they will be able to apply these tools to help manage sleep-related challenges. The purpose of this lesson is to provide patients with a general overview of sleep and outline the importance of quality sleep. Patients will learn about a few of the most common sleep disorders. Patients will also be introduced to the concept of "sleep hygiene," and discover ways to self-manage certain sleeping problems through simple daily behavior changes. Finally, the workshop will motivate patients by clarifying the links between quality sleep and their goals of heart-healthy living.   Recognizing and Reducing Stress Clinical staff led group instruction and group discussion with PowerPoint presentation and patient guidebook. To enhance the learning environment the use of posters, models and videos may be added. At the conclusion of this workshop, patients will be able to understand the types of stress reactions, differentiate between acute and chronic stress, and recognize the impact that chronic stress has on their health. They will  also be able to apply different coping mechanisms, such as reframing negative self-talk. Patients will have the opportunity to practice a variety of stress management techniques, such as deep abdominal breathing, progressive muscle relaxation, and/or guided imagery.  The purpose of this lesson is to educate patients on the role of stress in their lives and to provide healthy techniques for coping with it.  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   Learning Preferences Pictoral;Skilled Demonstration             Education Topics:  Knowledge Questionnaire Score:  Knowledge Questionnaire  Score - 09/27/21 1315       Knowledge Questionnaire Score   Pre Score 22/24             Core Components/Risk Factors/Patient Goals at Admission:  Personal Goals and Risk Factors at Admission - 09/27/21 1148       Core Components/Risk Factors/Patient Goals on Admission    Weight Management Yes;Obesity;Weight Loss    Intervention Weight Management/Obesity: Establish reasonable short term and long term weight goals.;Obesity: Provide education and appropriate resources to help participant work on and attain dietary goals.    Admit Weight 191 lb 12.8 oz (87 kg)    Expected Outcomes Weight Loss: Understanding of general recommendations for a balanced deficit meal plan, which promotes 1-2 lb weight loss per week and includes a negative energy balance of [phone] kcal/d;Short Term: Continue to assess and modify interventions until short term weight is achieved;Long Term: Adherence to nutrition and physical activity/exercise program aimed toward attainment of established weight goal    Hypertension Yes    Intervention Provide education on lifestyle modifcations including regular physical activity/exercise, weight management, moderate sodium restriction and increased consumption of fresh fruit, vegetables, and low fat dairy, alcohol moderation, and smoking cessation.;Monitor prescription use compliance.    Expected Outcomes Short Term: Continued assessment and intervention until BP is < 140/36mm HG in hypertensive participants. < 130/53mm HG in hypertensive participants with diabetes, heart failure or chronic kidney disease.;Long Term: Maintenance of blood pressure at goal levels.    Lipids Yes    Intervention Provide education and support for participant on nutrition & aerobic/resistive exercise along with prescribed medications to achieve LDL 70mg , HDL >40mg .    Expected Outcomes Short Term: Participant states understanding of desired cholesterol values and is compliant with medications  prescribed. Participant is following exercise prescription and nutrition guidelines.;Long Term: Cholesterol controlled with medications as prescribed, with individualized exercise RX and with personalized nutrition plan. Value goals: LDL < , HDL > 40 mg.    Stress Yes    Intervention Offer individual and/or small group education and counseling on adjustment to heart disease, stress management and health-related lifestyle change. Teach and support self-help strategies.;Refer participants experiencing significant psychosocial distress to appropriate mental health specialists for further evaluation and treatment. When possible, include family members and significant others in education/counseling sessions.    Expected Outcomes Short Term: Participant demonstrates changes in health-related behavior, relaxation and other stress management skills, ability to obtain effective social support, and compliance with psychotropic medications if prescribed.;Long Term: Emotional wellbeing is indicated by absence of clinically significant psychosocial distress or social isolation.    Personal Goal Other Yes    Personal Goal Live a healthier lifestyle. Develop a definitive exercise routine.  Intervention Offer individual and small group education on the Pritikin lifestyle plan. Develop individualized aerobic, resistance, and flexibility program that patient can follow and maintain.    Expected Outcomes Patient will follow recommendations provided on living a healthier lifestyle including healthy eating, healthy mindset, and regular exercise.             Core Components/Risk Factors/Patient Goals Review:    Core Components/Risk Factors/Patient Goals at Discharge (Final Review):    ITP Comments:  ITP Comments     Row Name 09/27/21 1140           ITP Comments Medical Director- Dr. Armanda Magic, MD. Introduction to [facility] Intensive Cardiac Rehab. Initial [facility] Orientation Packet  Reviewed with the patient.                Comments: Participant attended orientation for the cardiac rehabilitation program on  09/27/2021  to perform initial intake and exercise walk test. Patient introduced to the [facility] education and orientation packet was reviewed. Completed 6-minute walk test, measurements, initial ITP, and exercise prescription. Vital signs unstable. Telemetry-normal sinus rhythm, asymptomatic.   Service time was from 1140 to 1322.

## 2021-09-29 ENCOUNTER — Telehealth: Payer: Self-pay | Admitting: Cardiology

## 2021-09-29 ENCOUNTER — Other Ambulatory Visit: Payer: BC Managed Care – PPO

## 2021-09-29 DIAGNOSIS — I2510 Atherosclerotic heart disease of native coronary artery without angina pectoris: Secondary | ICD-10-CM

## 2021-09-29 DIAGNOSIS — R0989 Other specified symptoms and signs involving the circulatory and respiratory systems: Secondary | ICD-10-CM

## 2021-09-29 DIAGNOSIS — E785 Hyperlipidemia, unspecified: Secondary | ICD-10-CM

## 2021-09-29 LAB — COMPREHENSIVE METABOLIC PANEL
ALT: 36 IU/L (ref 0–44)
AST: 33 IU/L (ref 0–40)
Albumin/Globulin Ratio: 1.8 (ref 1.2–2.2)
Albumin: 4.4 g/dL (ref 3.8–4.9)
Alkaline Phosphatase: 87 IU/L (ref 44–121)
BUN/Creatinine Ratio: 18 (ref 9–20)
BUN: 19 mg/dL (ref 6–24)
Bilirubin Total: 0.5 mg/dL (ref 0.0–1.2)
CO2: 23 mmol/L (ref 20–29)
Calcium: 9.4 mg/dL (ref 8.7–10.2)
Chloride: 102 mmol/L (ref 96–106)
Creatinine, Ser: 1.05 mg/dL (ref 0.76–1.27)
Globulin, Total: 2.5 g/dL (ref 1.5–4.5)
Glucose: 112 mg/dL — ABNORMAL HIGH (ref 70–99)
Potassium: 4.8 mmol/L (ref 3.5–5.2)
Sodium: 140 mmol/L (ref 134–144)
Total Protein: 6.9 g/dL (ref 6.0–8.5)
eGFR: 82 mL/min/{1.73_m2} (ref >59)

## 2021-09-29 LAB — LIPID PANEL
Chol/HDL Ratio: 2.8 ratio (ref 0.0–5.0)
Cholesterol, Total: 142 mg/dL (ref 100–199)
HDL: 51 mg/dL (ref >39)
LDL Chol Calc (NIH): 78 mg/dL (ref 0–99)
Triglycerides: 64 mg/dL (ref 0–149)
VLDL Cholesterol Cal: 13 mg/dL (ref 5–40)

## 2021-09-29 MED ORDER — CLOPIDOGREL BISULFATE 75 MG PO TABS: 75.0000 mg | ORAL_TABLET | Freq: Every day | ORAL | 3 refills | Status: DC

## 2021-09-29 NOTE — Telephone Encounter (Signed)
Pt's medication was sent to pt's pharmacy as requested. Confirmation received.  °

## 2021-09-29 NOTE — Telephone Encounter (Signed)
Patient needs a refill on Plavix

## 2021-09-30 ENCOUNTER — Telehealth: Payer: Self-pay | Admitting: Cardiology

## 2021-09-30 DIAGNOSIS — E785 Hyperlipidemia, unspecified: Secondary | ICD-10-CM

## 2021-09-30 MED ORDER — EZETIMIBE 10 MG PO TABS: 10.0000 mg | ORAL_TABLET | Freq: Every day | ORAL | 3 refills | Status: DC

## 2021-09-30 NOTE — Telephone Encounter (Signed)
-----   Message from Beatrice Lecher, [place] sent at 09/29/2021  9:24 PM EDT ----- Creatinine, K+, LFTs normal.  LDL improved but still above goal. PLAN:  -Continue current medications -Add ezetimibe 10 mg daily -Lipids, LFTs in 3 months Tereso Newcomer, PA-C    09/29/2021 9:22 PM

## 2021-09-30 NOTE — Telephone Encounter (Signed)
The patient has been notified of the result and verbalized understanding.  All questions (if any) were answered. Theresia Majors, RN 09/30/2021 8:50 AM  Patient will start zetia 10 mg daily.  Labs have been scheduled.

## 2021-09-30 NOTE — Telephone Encounter (Signed)
Follow Up:      Patient is returning call, concerning his lab results 

## 2021-10-03 ENCOUNTER — Encounter (HOSPITAL_COMMUNITY)
Admission: RE | Admit: 2021-10-03 | Discharge: 2021-10-03 | Disposition: A | Discharge status: Home / Self Care | Payer: BC Managed Care – PPO | Source: Ambulatory Visit | Attending: Cardiology | Admitting: Cardiology

## 2021-10-03 DIAGNOSIS — Z955 Presence of coronary angioplasty implant and graft: Secondary | ICD-10-CM

## 2021-10-03 DIAGNOSIS — I480 Paroxysmal atrial fibrillation: Secondary | ICD-10-CM | POA: Diagnosis not present

## 2021-10-03 NOTE — Progress Notes (Signed)
Daily Session Note  Patient Details  Name: Brandon Madden MRN: [id number] Date of Birth: Nov 12, 1960 Referring Provider:   Flowsheet Row INTENSIVE CARDIAC REHAB ORIENT from 09/27/2021 in [place]  Referring Provider Freada Bergeron, MD       Encounter Date: 10/03/2021  Check In:  Session Check In - 10/03/21 0700       Check-In   Supervising physician immediately available to respond to emergencies [facility] immediately available    Physician(s) Dr. Posey Pronto    Location [facility]    Staff Present Seward Carol, MS, ACSM-CEP, Exercise Physiologist;Grae Leathers Ardis Hughs, RN;Jetta Gilford Rile BS, ACSM-CEP, Exercise Physiologist;Carlette Wilber Oliphant, RN, Quentin Ore, MS, ACSM-CEP, Exercise Physiologist    Virtual Visit No    Medication changes reported     No    Fall or balance concerns reported    No    Tobacco Cessation No Change    Warm-up and Cool-down Performed as group-led instruction    Resistance Training Performed Yes    VAD Patient? No    PAD/SET Patient? No      Pain Assessment   Currently in Pain? No/denies    Pain Score 0-No pain    Multiple Pain Sites No             Capillary Blood Glucose: No results found for this or any previous visit (from the past 24 hour(s)).   Exercise Prescription Changes - 10/03/21 0825       Response to Exercise   Blood Pressure (Admit) 108/70    Blood Pressure (Exercise) 122/80    Blood Pressure (Exit) 102/64    Heart Rate (Admit) 75 bpm    Heart Rate (Exercise) 105 bpm    Heart Rate (Exit) 79 bpm    Rating of Perceived Exertion (Exercise) 11.75    Perceived Dyspnea (Exercise) 0    Symptoms Left arm restrictions for PPM    Comments Pt first day in the [facility] program    Duration Progress to 30 minutes of  aerobic without signs/symptoms of physical distress    Intensity THRR unchanged      Progression   Progression Continue to progress workloads to maintain intensity without  signs/symptoms of physical distress.    Average METs 3.1      Resistance Training   Training Prescription Yes    Weight 4 lbs   Left arm restrictions PPM   Reps 10-15    Time 10 Minutes      Bike   Level 2.5    Minutes 15    METs 4.1      NuStep   Level 3    SPM 73    Minutes 15    METs 2.1             Social History   Tobacco Use  Smoking Status Never  Smokeless Tobacco Never    Goals Met:  Exercise tolerated well No report of concerns or symptoms today Strength training completed today  Goals Unmet:  Not Applicable  Comments: Pt started Intensive cardiac rehab today.  Pt tolerated light exercise without difficulty. VSS, telemetry-NSR, no signs or symptoms to report. Medication list reconciled. Pt denies barriers to medication compliance.  PSYCHOSOCIAL ASSESSMENT:  PHQ-0. Pt exhibits positive coping skills, hopeful outlook with supportive family. No psychosocial needs identified at this time, no psychosocial interventions necessary. Pt enjoys activities with his children. Pt oriented to exercise equipment and routine. Understanding verbalized.  Dr. Fransico Him is Medical  Director for Cardiac Rehab at [facility] RN 10/03/2021 2:31 PM

## 2021-10-03 NOTE — Progress Notes (Signed)
QUALITY OF LIFE SCORE REVIEW  Pt completed Quality of Life survey as a participant in Cardiac Rehab.  Scores 19.0 or below are considered low. Overall 21.76, Health and Function 20.33, socioeconomic 22.36, physiological and spiritual 22.64, family 51. Patient quality of life slightly altered by physical constraints which limits ability to perform as prior to recent cardiac illness. Offered emotional support and reassurance. Will continue to monitor and intervene as necessary.   Newell Coral RN 10/03/2021 8:00 AM

## 2021-10-04 ENCOUNTER — Other Ambulatory Visit: Payer: Self-pay

## 2021-10-04 ENCOUNTER — Telehealth: Payer: Self-pay | Admitting: Pulmonary Disease

## 2021-10-04 MED ORDER — ASPIRIN 81 MG PO TBEC: 81.0000 mg | DELAYED_RELEASE_TABLET | Freq: Every day | ORAL | 3 refills | Status: DC

## 2021-10-04 NOTE — Telephone Encounter (Signed)
I received a fax from [employer] that the patient would need a CPAP compliance check between 11/03/2021 and 01/01/2022.

## 2021-10-05 ENCOUNTER — Encounter (HOSPITAL_COMMUNITY): Payer: BC Managed Care – PPO

## 2021-10-07 ENCOUNTER — Encounter (HOSPITAL_COMMUNITY): Payer: BC Managed Care – PPO

## 2021-10-10 ENCOUNTER — Encounter (HOSPITAL_COMMUNITY): Payer: BC Managed Care – PPO

## 2021-10-12 ENCOUNTER — Encounter (HOSPITAL_COMMUNITY): Payer: BC Managed Care – PPO

## 2021-10-12 NOTE — Telephone Encounter (Signed)
Attempted to call pt but unable to reach. Left pt a detailed message that we needed to get him scheduled for a cpap compliance appt between 9/14-11/2. Nothing further needed.

## 2021-10-12 NOTE — Telephone Encounter (Signed)
Please schedule appointment for CPAP compliance

## 2021-10-12 NOTE — Progress Notes (Signed)
Cardiac Individual Treatment Plan  Patient Details  Name: Brandon Madden MRN: [id number] Date of Birth: [birth year] Referring Provider:   Flowsheet Row INTENSIVE CARDIAC REHAB ORIENT from 09/27/2021 in [place]  Referring Provider Freada Bergeron, MD       Initial Encounter Date:  [place] from 09/27/2021 in [place]  Date 09/27/21       Visit Diagnosis: 07/15/21 DES LAD  Patient's Home Medications on Admission:  Current Outpatient Medications:    acetaminophen (TYLENOL) 500 MG tablet, Take 500-1,000 mg by mouth every 6 (six) hours as needed (pain.)., Disp: , Rfl:    aspirin EC 81 MG tablet, Take 1 tablet (81 mg total) by mouth daily., Disp: 90 tablet, Rfl: 3   aspirin-acetaminophen-caffeine (EXCEDRIN MIGRAINE) 250-250-65 MG tablet, Take 1-2 tablets by mouth 2 (two) times daily as needed for headache., Disp: , Rfl:    clopidogrel (PLAVIX) 75 MG tablet, Take 1 tablet (75 mg total) by mouth daily., Disp: 90 tablet, Rfl: 3   diltiazem (CARDIZEM) 30 MG tablet, Take 1 tablet (30 mg total) by mouth 4 (four) times daily as needed., Disp: 30 tablet, Rfl: 3   ezetimibe (ZETIA) 10 MG tablet, Take 1 tablet (10 mg total) by mouth daily., Disp: 90 tablet, Rfl: 3   ibuprofen (ADVIL) 200 MG tablet, Take 400-600 mg by mouth every 8 (eight) hours as needed (pain.)., Disp: , Rfl:    metoprolol succinate (TOPROL XL) 25 MG 24 hr tablet, Take 0.5 tablets (12.5 mg total) by mouth daily. X 2 days than discontinue on June 19,2023. (Patient not taking: Reported on 08/31/2021), Disp: 90 tablet, Rfl: 3   Multiple Vitamins-Minerals (MULTI FOR HIM) TABS, Take 1 tablet by mouth in the morning., Disp: , Rfl:    nitroGLYCERIN (NITROSTAT) 0.4 MG SL tablet, Place 1 tablet (0.4 mg total) under the tongue every 5 (five) minutes as needed for chest pain., Disp: 90 tablet, Rfl: 3   Polyethyl Glycol-Propyl Glycol (LUBRICANT  EYE DROPS) 0.4-0.3 % SOLN, Place 1-2 drops into both eyes 3 (three) times daily as needed (dry/irritated eyes.)., Disp: , Rfl:    rosuvastatin (CRESTOR) 20 MG tablet, Take 2 tablets (40 mg total) by mouth daily., Disp: 180 tablet, Rfl: 3  Past Medical History: Past Medical History:  Diagnosis Date   CAD (coronary artery disease) 07/13/2021   CCTA 06/2021: CAC score 260 (84th percentile); mLAD 70-99; mLAD FFR pos (0.60) Status post DES to the mid LAD 06/2021   Carotid stenosis 08/11/2021   Carotid US 07/2021: Bilat ICA 1-39   Deafness in right ear    Family history of pancreatic cancer    Family history of prostate cancer    Family history of skin cancer    Hyperlipidemia LDL goal <70 08/20/2018   PAF (paroxysmal atrial fibrillation) ([place]) 05/06/2021   Sleep apnea     Tobacco Use: Social History   Tobacco Use  Smoking Status Never  Smokeless Tobacco Never    Labs: Review Flowsheet  More data exists      Latest Ref Rng & Units 06/03/2020 08/10/2020 09/30/2020 05/06/2021 09/29/2021  Labs for ITP Cardiac and Pulmonary Rehab  Cholestrol 100 - 199 mg/dL 187  237  171  - 142   LDL (calc) 0 - 99 mg/dL 117  172  110  - 78   HDL-C >39 mg/dL 52  50  45  - 51   Trlycerides 0 - 149 mg/dL  102  87  83  - 64   Hemoglobin A1c 4.6 - 6.5 % - - - 5.9  -    Capillary Blood Glucose: No results found for: "GLUCAP"   Exercise Target Goals: Exercise Program Goal: Individual exercise prescription set using results from initial 6 min walk test and THRR while considering  patient's activity barriers and safety.   Exercise Prescription Goal: Initial exercise prescription builds to 30-45 minutes a day of aerobic activity, 2-3 days per week.  Home exercise guidelines will be given to patient during program as part of exercise prescription that the participant will acknowledge.  Activity Barriers & Risk Stratification:  Activity Barriers & Cardiac Risk Stratification - 09/27/21 1210       Activity  Barriers & Cardiac Risk Stratification   Activity Barriers Other (comment);Balance Concerns    Comments PPM implantation on 09/01/21. Lifiting restriction less than 10 lbs through 10/10/21. Prior to PPM implantation, pt experienced aura, syncopal episode, effected balance    Cardiac Risk Stratification High             6 Minute Walk:  6 Minute Walk     Row Name 09/27/21 1200         6 Minute Walk   Phase Initial     Distance 1865 feet     Walk Time 6 minutes     # of Rest Breaks 0     MPH 3.53     METS 4.12     RPE 11     Perceived Dyspnea  0     VO2 Peak 14.44     Symptoms No     Resting HR 85 bpm     Resting BP 104/72     Resting Oxygen Saturation  97 %     Exercise Oxygen Saturation  during 6 min walk 99 %     Max Ex. HR 103 bpm     Max Ex. BP 110/74     2 Minute Post BP 104/76              Oxygen Initial Assessment:   Oxygen Re-Evaluation:   Oxygen Discharge (Final Oxygen Re-Evaluation):   Initial Exercise Prescription:  Initial Exercise Prescription - 09/27/21 1200       Date of Initial Exercise RX and Referring Provider   Date 09/27/21    Referring Provider Freada Bergeron, MD    Expected Discharge Date 12/02/21      Bike   Level 2.5    Minutes 15    METs 3.5      NuStep   Level 3    SPM 85    Minutes 15    METs 3.5      Prescription Details   Frequency (times per week) 3    Duration Progress to 30 minutes of continuous aerobic without signs/symptoms of physical distress      Intensity   THRR 40-80% of Max Heartrate 64-129    Ratings of Perceived Exertion 11-13    Perceived Dyspnea 0-4      Progression   Progression Continue to progress workloads to maintain intensity without signs/symptoms of physical distress.      Resistance Training   Training Prescription Yes    Weight 4 lbs    Reps 10-15             Perform Capillary Blood Glucose checks as needed.  Exercise Prescription Changes:   Exercise Prescription  Changes     Row Name  10/03/21 0825             Response to Exercise   Blood Pressure (Admit) 108/70       Blood Pressure (Exercise) 122/80       Blood Pressure (Exit) 102/64       Heart Rate (Admit) 75 bpm       Heart Rate (Exercise) 105 bpm       Heart Rate (Exit) 79 bpm       Rating of Perceived Exertion (Exercise) 11.75       Perceived Dyspnea (Exercise) 0       Symptoms Left arm restrictions for PPM       Comments Pt first day in the CRP2 program       Duration Progress to 30 minutes of  aerobic without signs/symptoms of physical distress       Intensity THRR unchanged         Progression   Progression Continue to progress workloads to maintain intensity without signs/symptoms of physical distress.       Average METs 3.1         Resistance Training   Training Prescription Yes       Weight 4 lbs  Left arm restrictions PPM       Reps 10-15       Time 10 Minutes         Bike   Level 2.5       Minutes 15       METs 4.1         NuStep   Level 3       SPM 73       Minutes 15       METs 2.1                Exercise Comments:   Exercise Comments     Row Name 10/03/21 0837           Exercise Comments Pt first day in the CRP2 program. Pt tolerated exercise well with an average MET level of 3.1. Pt is learning his THRR, RPE and ExRx. Off to a great start. Pt will be out of town until next wednesday and wanted guidlines for exercise, reviewed how to check pulse rate and practiced, advised only light walking on a TM for exercise (he is already walking for exercise on his own at home), RPE 11-13.                Exercise Goals and Review:   Exercise Goals     Row Name 09/27/21 1147             Exercise Goals   Increase Physical Activity Yes       Intervention Provide advice, education, support and counseling about physical activity/exercise needs.;Develop an individualized exercise prescription for aerobic and resistive training based on initial  evaluation findings, risk stratification, comorbidities and participant's personal goals.       Expected Outcomes Short Term: Attend rehab on a regular basis to increase amount of physical activity.;Long Term: Exercising regularly at least 3-5 days a week.;Long Term: Add in home exercise to make exercise part of routine and to increase amount of physical activity.       Increase Strength and Stamina Yes       Intervention Provide advice, education, support and counseling about physical activity/exercise needs.;Develop an individualized exercise prescription for aerobic and resistive training based on initial evaluation findings, risk stratification, comorbidities and participant's personal  goals.       Expected Outcomes Short Term: Increase workloads from initial exercise prescription for resistance, speed, and METs.;Short Term: Perform resistance training exercises routinely during rehab and add in resistance training at home;Long Term: Improve cardiorespiratory fitness, muscular endurance and strength as measured by increased METs and functional capacity (6MWT)       Able to understand and use rate of perceived exertion (RPE) scale Yes       Intervention Provide education and explanation on how to use RPE scale       Expected Outcomes Short Term: Able to use RPE daily in rehab to express subjective intensity level;Long Term:  Able to use RPE to guide intensity level when exercising independently       Knowledge and understanding of Target Heart Rate Range (THRR) Yes       Intervention Provide education and explanation of THRR including how the numbers were predicted and where they are located for reference       Expected Outcomes Short Term: Able to state/look up THRR;Long Term: Able to use THRR to govern intensity when exercising independently;Short Term: Able to use daily as guideline for intensity in rehab       Able to check pulse independently Yes       Intervention Provide education and  demonstration on how to check pulse in carotid and radial arteries.;Review the importance of being able to check your own pulse for safety during independent exercise       Expected Outcomes Short Term: Able to explain why pulse checking is important during independent exercise;Long Term: Able to check pulse independently and accurately       Understanding of Exercise Prescription Yes       Intervention Provide education, explanation, and written materials on patient's individual exercise prescription       Expected Outcomes Short Term: Able to explain program exercise prescription;Long Term: Able to explain home exercise prescription to exercise independently                Exercise Goals Re-Evaluation :  Exercise Goals Re-Evaluation     [place] Name 10/03/21 0833             Exercise Goal Re-Evaluation   Exercise Goals Review Increase Physical Activity;Increase Strength and Stamina;Able to understand and use rate of perceived exertion (RPE) scale;Knowledge and understanding of Target Heart Rate Range (THRR);Able to check pulse independently;Understanding of Exercise Prescription       Comments Pt first day in the CRP2 program. Pt tolerated exercise well with an average MET level of 3.1. Pt is learning his THRR, RPE and ExRx. Off to a great start. Pt will be out of town until next wednesday and wanted guidlines for exercise, reviewed how to check pulse rate and practiced, advised only light walking on a TM for exercise (he is already walking for exercise on his own at home), RPE 11-13.       Expected Outcomes Will continue to monitor pt and progress workloads as tolerated without sign or symptom                Discharge Exercise Prescription (Final Exercise Prescription Changes):  Exercise Prescription Changes - 10/03/21 0825       Response to Exercise   Blood Pressure (Admit) 108/70    Blood Pressure (Exercise) 122/80    Blood Pressure (Exit) 102/64    Heart Rate (Admit) 75 bpm     Heart Rate (Exercise) 105 bpm  Heart Rate (Exit) 79 bpm    Rating of Perceived Exertion (Exercise) 11.75    Perceived Dyspnea (Exercise) 0    Symptoms Left arm restrictions for PPM    Comments Pt first day in the CRP2 program    Duration Progress to 30 minutes of  aerobic without signs/symptoms of physical distress    Intensity THRR unchanged      Progression   Progression Continue to progress workloads to maintain intensity without signs/symptoms of physical distress.    Average METs 3.1      Resistance Training   Training Prescription Yes    Weight 4 lbs   Left arm restrictions PPM   Reps 10-15    Time 10 Minutes      Bike   Level 2.5    Minutes 15    METs 4.1      NuStep   Level 3    SPM 73    Minutes 15    METs 2.1             Nutrition:  Target Goals: Understanding of nutrition guidelines, daily intake of sodium <1561m, cholesterol <2054m calories 30% from fat and 7% or less from saturated fats, daily to have 5 or more servings of fruits and vegetables.  Biometrics:  Pre Biometrics - 09/27/21 1140       Pre Biometrics   Waist Circumference 41.5 inches    Hip Circumference 42.25 inches    Waist to Hip Ratio 0.98 %    Triceps Skinfold 19 mm    % Body Fat 30.5 %    Grip Strength 34 kg    Flexibility 14 in    Single Leg Stand 30 seconds              Nutrition Therapy Plan and Nutrition Goals:  Nutrition Therapy & Goals - 09/27/21 1653       Nutrition Therapy   Diet Heart Healthy Diet    Drug/Food Interactions Statins/Certain Fruits      Personal Nutrition Goals   Nutrition Goal Patient to choose a daily variety of fruits, vegetables, whole grains, lean protein/plant protein, nonfat dairy as part of heart healthy lifestyle    Personal Goal #2 Patient to limit sodium to <150028maily.    Personal Goal #3 Patient to identify and limit food sources of refined carbohydrates, sodium, saturated fat, trans fat.    Personal Goal #4 Patient to  learn strategies for weight loss of 0.5-2.0# per week of weight loss.    Comments Patient is extremely motivated to make lifestyle changes to aid with weight loss and heart health. He has started making many dietary changes including reduced carbohydrates and increased lean protein and fiber intake. [facility] the [facility] 6 [facility]. 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      Intervention Plan   Intervention Prescribe, educate and counsel regarding individualized specific dietary modifications aiming towards targeted core components such as weight, hypertension, lipid management, diabetes, heart failure and other comorbidities.;Nutrition handout(s) given to patient.    Expected Outcomes Short Term Goal: Understand basic principles of dietary content, such as calories, fat, sodium, cholesterol and nutrients.;Long Term Goal: Adherence to prescribed nutrition plan.             Nutrition Assessments:  Nutrition Assessments - 09/27/21 1703       Rate  Your Plate Scores   Pre Score 76            MEDIFICTS Score Key: ?70 Need to make dietary changes  40-70 Heart Healthy Diet ? 40 Therapeutic Level Cholesterol Diet   Flowsheet Row INTENSIVE CARDIAC REHAB ORIENT from 09/27/2021 in [facility]  Picture Your Plate Total Score on Admission 76      Picture Your Plate Scores: <40 Unhealthy dietary pattern with much room for improvement. 41-50 Dietary pattern unlikely to meet recommendations for good health and room for improvement. 51-60 More healthful dietary pattern, with some room for improvement.  >60 Healthy dietary pattern, although there may be some specific behaviors that could be improved.    Nutrition Goals Re-Evaluation:  Nutrition Goals Re-Evaluation     [place] Name 09/27/21 1653             Goals   Current Weight 191 lb 12.8 oz (87 kg)        Comment LDL 106, A1c WNL       Expected Outcome Patient is extremely motivated to make lifestyle changes to aid with weight loss and heart health. He has started making many dietary changes including reduced carbohydrates and increased lean protein and fiber intake. He is working with [facility] and is down ~11# over the last 6 weeks. 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 Expect continued weight loss and improved lipid panel with continuation of lifestyle changes. Will continues to discuss carbohydrate intake for long term success with weight loss/keep weight off.                Nutrition Goals Re-Evaluation:  Nutrition Goals Re-Evaluation     [place] Name 09/27/21 1653             Goals   Current Weight 191 lb 12.8 oz (87 kg)       Comment LDL 106, A1c WNL       Expected Outcome Patient is extremely motivated to make lifestyle changes to aid with weight loss and heart health. He has started making many dietary changes including reduced carbohydrates and increased lean protein and fiber intake. He is working with [facility] and is down ~11# over the last 6 weeks. 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 Expect continued weight loss and improved lipid panel with continuation of lifestyle changes. Will continues to discuss carbohydrate intake for long term success with weight loss/keep weight off.                Nutrition Goals Discharge (Final Nutrition Goals Re-Evaluation):  Nutrition Goals Re-Evaluation - 09/27/21 1653       Goals   Current Weight 191 lb 12.8 oz (87 kg)    Comment LDL 106, A1c WNL    Expected Outcome Patient is extremely motivated to make lifestyle changes to aid with weight loss and heart health. He has started making many dietary changes including reduced carbohydrates and increased lean protein and fiber intake. He is  working with [facility] and is down ~11# over the last 6 weeks. 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 Expect continued weight loss and improved lipid panel with continuation of lifestyle changes. Will continues to discuss carbohydrate  intake for long term success with weight loss/keep weight off.             Psychosocial: Target Goals: Acknowledge presence or absence of significant depression and/or stress, maximize coping skills, provide positive support system. Participant is able to verbalize types and ability to use techniques and skills needed for reducing stress and depression.  Initial Review & Psychosocial Screening:  Initial Psych Review & Screening - 09/27/21 1515       Initial Review   Current issues with Current Stress Concerns    Source of Stress Concerns Chronic Illness    Comments Daxtyn is concerned about his recent cardiac events      Family Dynamics   Good Support System? Yes   Markeis has wife, children and extended family and friends for support     Barriers   Psychosocial barriers to participate in program The patient should benefit from training in stress management and relaxation.      Screening Interventions   Interventions Encouraged to exercise;To provide support and resources with identified psychosocial needs    Expected Outcomes Long Term Goal: Stressors or current issues are controlled or eliminated.             Quality of Life Scores:  Quality of Life - 09/27/21 1312       Quality of Life   Select Quality of Life      Quality of Life Scores   Health/Function Pre 20.33 %    Socioeconomic Pre 22.36 %    Psych/Spiritual Pre 22.64 %    Family Pre 24 %    GLOBAL Pre 21.76 %            Scores of 19 and below usually indicate a poorer quality of life in these areas.  A difference of  2-3 points is a clinically meaningful difference.  A difference of 2-3 points in the  total score of the Quality of Life Index has been associated with significant improvement in overall quality of life, self-image, physical symptoms, and general health in studies assessing change in quality of life.  PHQ-9: Review Flowsheet       09/27/2021 05/06/2021 03/30/2020  Depression screen PHQ 2/9  Decreased Interest 0 0 0  Down, Depressed, Hopeless 0 0 0  PHQ - 2 Score 0 0 0   Interpretation of Total Score  Total Score Depression Severity:  1-4 = Minimal depression, 5-9 = Mild depression, 10-14 = Moderate depression, 15-19 = Moderately severe depression, 20-27 = Severe depression   Psychosocial Evaluation and Intervention:  Psychosocial Evaluation - 10/12/21 1349       Psychosocial Evaluation & Interventions   Interventions Stress management education;Relaxation education;Encouraged to exercise with the program and follow exercise prescription    Comments Cable has completed 1 exercise session on 8/14.  Will encourage attendance to stress related educational sessions.    Expected Outcomes Johnie will report decrease stress as it relates to his new cardiac issues and employ learned  stress management techniques.    Continue Psychosocial Services  Follow up required by staff             Psychosocial Re-Evaluation:   Psychosocial Discharge (Final Psychosocial Re-Evaluation):   Vocational Rehabilitation: Provide vocational rehab assistance to qualifying candidates.   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 Participant will state understanding/return demonstration of topics presented.    Education     Row Name  10/03/21 0900     Education   Cardiac Education Topics Pritikin   Select Workshops     Workshops   Educator Dietitian   Select Nutrition   Nutrition Workshop Fueling a Designer, multimedia   Instruction Review Code 1- Verbalizes Understanding   Class Start Time 0815   Class Stop Time 0904   Class Time Calculation (min) 49 min            Core Videos: Exercise    Move It!  Clinical staff conducted group or individual video education with verbal and written material and guidebook.  Patient learns the recommended Pritikin exercise program. Exercise with the goal of living a long, healthy life. Some of the health benefits of exercise include controlled diabetes, healthier blood pressure levels, improved cholesterol levels, improved heart and lung capacity, improved sleep, and better body composition. Everyone should speak with their doctor before starting or changing an exercise routine.  Biomechanical Limitations Clinical staff conducted group or individual video education with verbal and written material and guidebook.  Patient learns how biomechanical limitations can impact exercise and how we can mitigate and possibly overcome limitations to have an impactful and balanced exercise routine.  Body Composition Clinical staff conducted group or individual video education with verbal and written material and guidebook.  Patient learns that body composition (ratio of muscle mass to fat mass) is a key component to assessing overall fitness, rather than body weight alone. Increased fat mass, especially visceral belly fat, can put [place] at increased risk for metabolic syndrome, type 2 diabetes, heart disease, and even death. It is recommended to combine diet and exercise (cardiovascular and resistance training) to improve your body composition. Seek guidance from your physician and exercise physiologist before implementing an exercise routine.  Exercise Action Plan Clinical staff conducted group or  individual video education with verbal and written material and guidebook.  Patient learns the recommended strategies to achieve and enjoy long-term exercise adherence, including variety, self-motivation, self-efficacy, and positive decision making. Benefits of exercise include fitness, good health, weight management, more energy, better sleep, less stress, and overall well-being.  Medical   Heart Disease Risk Reduction Clinical staff conducted group or individual video education with verbal and written material and guidebook.  Patient learns our heart is our most vital organ as it circulates oxygen, nutrients, white blood cells, and hormones throughout the entire body, and carries waste away. Data supports a plant-based eating plan like the Pritikin Program for its effectiveness in slowing progression of and reversing heart disease. The video provides a number of recommendations to address heart disease.   Metabolic Syndrome and Belly Fat  Clinical staff conducted group or individual video education with verbal and written material and guidebook.  Patient learns what metabolic syndrome is, how it leads to heart disease, and how one can reverse it and keep it from coming back. You have metabolic syndrome if you have 3 of the following 5 criteria: abdominal obesity, high blood pressure, high triglycerides, low HDL cholesterol, and high blood sugar.  Hypertension and Heart Disease Clinical staff conducted group or individual video education with verbal and written material and guidebook.  Patient learns that  high blood pressure, or hypertension, is very common in the [place]. Hypertension is largely due to excessive salt intake, but other important risk factors include being overweight, physical inactivity, drinking too much alcohol, smoking, and not eating enough potassium from fruits and vegetables. High blood pressure is a leading risk factor for heart attack, stroke, congestive heart failure,  dementia, kidney failure, and premature death. Long-term effects of excessive salt intake include stiffening of the arteries and thickening of heart muscle and organ damage. Recommendations include ways to reduce hypertension and the risk of heart disease.  Diseases of Our Time - Focusing on Diabetes Clinical staff conducted group or individual video education with verbal and written material and guidebook.  Patient learns why the best way to stop diseases of our time is prevention, through food and other lifestyle changes. Medicine (such as prescription pills and surgeries) is often only a Band-Aid on the problem, not a long-term solution. Most common diseases of our time include obesity, type 2 diabetes, hypertension, heart disease, and cancer. The Pritikin Program is recommended and has been proven to help reduce, reverse, and/or prevent the damaging effects of metabolic syndrome.  Nutrition   Overview of the Pritikin Eating Plan  Clinical staff conducted group or individual video education with verbal and written material and guidebook.  Patient learns about the [place] for disease risk reduction. The [place] emphasizes a wide variety of unrefined, minimally-processed carbohydrates, like fruits, vegetables, whole grains, and legumes. Go, Caution, and Stop food choices are explained. Plant-based and lean animal proteins are emphasized. Rationale provided for low sodium intake for blood pressure control, low added sugars for blood sugar stabilization, and low added fats and oils for coronary artery disease risk reduction and weight management.  Calorie Density  Clinical staff conducted group or individual video education with verbal and written material and guidebook.  Patient learns about calorie density and how it impacts the Pritikin Eating Plan. Knowing the characteristics of the food you choose will help you decide whether those foods will lead to weight gain or weight  loss, and whether you want to consume more or less of them. Weight loss is usually a side effect of the Pritikin Eating Plan because of its focus on low calorie-dense foods.  Label Reading  Clinical staff conducted group or individual video education with verbal and written material and guidebook.  Patient learns about the Pritikin recommended label reading guidelines and corresponding recommendations regarding calorie density, added sugars, sodium content, and whole grains.  Dining Out - Part 1  Clinical staff conducted group or individual video education with verbal and written material and guidebook.  Patient learns that restaurant meals can be sabotaging because they can be so high in calories, fat, sodium, and/or sugar. Patient learns recommended strategies on how to positively address this and avoid unhealthy pitfalls.  Facts on Fats  Clinical staff conducted group or individual video education with verbal and written material and guidebook.  Patient learns that lifestyle modifications can be just as effective, if not more so, as many medications for lowering your risk of heart disease. A Pritikin lifestyle can help to reduce your risk of inflammation and atherosclerosis (cholesterol build-up, or plaque, in the artery walls). Lifestyle interventions such as dietary choices and physical activity address the cause of atherosclerosis. A review of the types of fats and their impact on blood cholesterol levels, along with dietary recommendations to reduce fat intake is also included.  Nutrition Action Plan  Clinical  staff conducted group or individual video education with verbal and written material and guidebook.  Patient learns how to incorporate Pritikin recommendations into their lifestyle. Recommendations include planning and keeping personal health goals in mind as an important part of their success.  Healthy Mind-Set    Healthy Minds, Bodies, Hearts  Clinical staff conducted group or  individual video education with verbal and written material and guidebook.  Patient learns how to identify when they are stressed. Video will discuss the impact of that stress, as well as the many benefits of stress management. Patient will also be introduced to stress management techniques. The way we think, act, and feel has an impact on our hearts.  How Our Thoughts Can Heal Our Hearts  Clinical staff conducted group or individual video education with verbal and written material and guidebook.  Patient learns that negative thoughts can cause depression and anxiety. This can result in negative lifestyle behavior and serious health problems. Cognitive behavioral therapy is an effective method to help control our thoughts in order to change and improve our emotional outlook.  Additional Videos:  Exercise    Improving Performance  Clinical staff conducted group or individual video education with verbal and written material and guidebook.  Patient learns to use a non-linear approach by alternating intensity levels and lengths of time spent exercising to help burn more calories and lose more body fat. Cardiovascular exercise helps improve heart health, metabolism, hormonal balance, blood sugar control, and recovery from fatigue. Resistance training improves strength, endurance, balance, coordination, reaction time, metabolism, and muscle mass. Flexibility exercise improves circulation, posture, and balance. Seek guidance from your physician and exercise physiologist before implementing an exercise routine and learn your capabilities and proper form for all exercise.  Introduction to Yoga  Clinical staff conducted group or individual video education with verbal and written material and guidebook.  Patient learns about yoga, a discipline of the coming together of mind, breath, and body. The benefits of yoga include improved flexibility, improved range of motion, better posture and core strength, increased  lung function, weight loss, and positive self-image. Yoga's heart health benefits include lowered blood pressure, healthier heart rate, decreased cholesterol and triglyceride levels, improved immune function, and reduced stress. Seek guidance from your physician and exercise physiologist before implementing an exercise routine and learn your capabilities and proper form for all exercise.  Medical   Aging: Enhancing Your Quality of Life  Clinical staff conducted group or individual video education with verbal and written material and guidebook.  Patient learns key strategies and recommendations to stay in good physical health and enhance quality of life, such as prevention strategies, having an advocate, securing a [place], and keeping a list of medications and system for tracking them. It also discusses how to avoid risk for bone loss.  Biology of Weight Control  Clinical staff conducted group or individual video education with verbal and written material and guidebook.  Patient learns that weight gain occurs because we consume more calories than we burn (eating more, moving less). Even if your body weight is normal, you may have higher ratios of fat compared to muscle mass. Too much body fat puts you at increased risk for cardiovascular disease, heart attack, stroke, type 2 diabetes, and obesity-related cancers. In addition to exercise, following the [place] can help reduce your risk.  Decoding Lab Results  Clinical staff conducted group or individual video education with verbal and written material and guidebook.  Patient  learns that lab test reflects one measurement whose values change over time and are influenced by many factors, including medication, stress, sleep, exercise, food, hydration, pre-existing medical conditions, and more. It is recommended to use the knowledge from this video to become more involved with your lab results and evaluate your  numbers to speak with your doctor.   Diseases of Our Time - Overview  Clinical staff conducted group or individual video education with verbal and written material and guidebook.  Patient learns that according to the CDC, 50% to 70% of chronic diseases (such as obesity, type 2 diabetes, elevated lipids, hypertension, and heart disease) are avoidable through lifestyle improvements including healthier food choices, listening to satiety cues, and increased physical activity.  Sleep Disorders Clinical staff conducted group or individual video education with verbal and written material and guidebook.  Patient learns how good quality and duration of sleep are important to overall health and well-being. Patient also learns about sleep disorders and how they impact health along with recommendations to address them, including discussing with a physician.  Nutrition  Dining Out - Part 2 Clinical staff conducted group or individual video education with verbal and written material and guidebook.  Patient learns how to plan ahead and communicate in order to maximize their dining experience in a healthy and nutritious manner. Included are recommended food choices based on the type of restaurant the patient is visiting.   Fueling a Best boy conducted group or individual video education with verbal and written material and guidebook.  There is a strong connection between our food choices and our health. Diseases like obesity and type 2 diabetes are very prevalent and are in large-part due to lifestyle choices. The Pritikin Eating Plan provides plenty of food and hunger-curbing satisfaction. It is easy to follow, affordable, and helps reduce health risks.  Menu Workshop  Clinical staff conducted group or individual video education with verbal and written material and guidebook.  Patient learns that restaurant meals can sabotage health goals because they are often packed with calories, fat,  sodium, and sugar. Recommendations include strategies to plan ahead and to communicate with the manager, chef, or server to help order a healthier meal.  Planning Your Eating Strategy  Clinical staff conducted group or individual video education with verbal and written material and guidebook.  Patient learns about the [place] and its benefit of reducing the risk of disease. The [place] does not focus on calories. Instead, it emphasizes high-quality, nutrient-rich foods. By knowing the characteristics of the foods, we choose, we can determine their calorie density and make informed decisions.  Targeting Your Nutrition Priorities  Clinical staff conducted group or individual video education with verbal and written material and guidebook.  Patient learns that lifestyle habits have a tremendous impact on disease risk and progression. This video provides eating and physical activity recommendations based on your personal health goals, such as reducing LDL cholesterol, losing weight, preventing or controlling type 2 diabetes, and reducing high blood pressure.  Vitamins and Minerals  Clinical staff conducted group or individual video education with verbal and written material and guidebook.  Patient learns different ways to obtain key vitamins and minerals, including through a recommended healthy diet. It is important to discuss all supplements you take with your doctor.   Healthy Mind-Set    Smoking Cessation  Clinical staff conducted group or individual video education with verbal and written material and guidebook.  Patient learns that cigarette smoking and  tobacco addiction pose a serious health risk which affects millions of people. Stopping smoking will significantly reduce the risk of heart disease, lung disease, and many forms of cancer. Recommended strategies for quitting are covered, including working with your doctor to develop a successful plan.  Culinary    Becoming a Financial trader conducted group or individual video education with verbal and written material and guidebook.  Patient learns that cooking at home can be healthy, cost-effective, quick, and puts them in control. Keys to cooking healthy recipes will include looking at your recipe, assessing your equipment needs, planning ahead, making it simple, choosing cost-effective seasonal ingredients, and limiting the use of added fats, salts, and sugars.  Cooking - Breakfast and Snacks  Clinical staff conducted group or individual video education with verbal and written material and guidebook.  Patient learns how important breakfast is to satiety and nutrition through the entire day. Recommendations include key foods to eat during breakfast to help stabilize blood sugar levels and to prevent overeating at meals later in the day. Planning ahead is also a key component.  Cooking - Human resources officer conducted group or individual video education with verbal and written material and guidebook.  Patient learns eating strategies to improve overall health, including an approach to cook more at home. Recommendations include thinking of animal protein as a side on your plate rather than center stage and focusing instead on lower calorie dense options like vegetables, fruits, whole grains, and plant-based proteins, such as beans. Making sauces in large quantities to freeze for later and leaving the skin on your vegetables are also recommended to maximize your experience.  Cooking - Healthy Salads and Dressing Clinical staff conducted group or individual video education with verbal and written material and guidebook.  Patient learns that vegetables, fruits, whole grains, and legumes are the foundations of the [place]. Recommendations include how to incorporate each of these in flavorful and healthy salads, and how to create homemade salad dressings. Proper handling of  ingredients is also covered. Cooking - Soups and [employer] - Soups and Desserts Clinical staff conducted group or individual video education with verbal and written material and guidebook.  Patient learns that Pritikin soups and desserts make for easy, nutritious, and delicious snacks and meal components that are low in sodium, fat, sugar, and calorie density, while high in vitamins, minerals, and filling fiber. Recommendations include simple and healthy ideas for soups and desserts.   Overview     The Pritikin Solution Program Overview Clinical staff conducted group or individual video education with verbal and written material and guidebook.  Patient learns that the results of the [place] Program have been documented in more than 100 articles published in peer-reviewed journals, and the benefits include reducing risk factors for (and, in some cases, even reversing) high cholesterol, high blood pressure, type 2 diabetes, obesity, and more! An overview of the three key pillars of the Pritikin Program will be covered: eating well, doing regular exercise, and having a healthy mind-set.  WORKSHOPS  Exercise: Exercise Basics: Building Your Action Plan Clinical staff led group instruction and group discussion with PowerPoint presentation and patient guidebook. To enhance the learning environment the use of posters, models and videos may be added. At the conclusion of this workshop, patients will comprehend the difference between physical activity and exercise, as well as the benefits of incorporating both, into their routine. Patients will understand the FITT (Frequency, Intensity, Time, and Type)  principle and how to use it to build an exercise action plan. In addition, safety concerns and other considerations for exercise and cardiac rehab will be addressed by the presenter. The purpose of this lesson is to promote a comprehensive and effective weekly exercise routine in order to improve  patients' overall level of fitness.   Managing Heart Disease: Your Path to a Healthier Heart Clinical staff led group instruction and group discussion with PowerPoint presentation and patient guidebook. To enhance the learning environment the use of posters, models and videos may be added.At the conclusion of this workshop, patients will understand the anatomy and physiology of the heart. Additionally, they will understand how Pritikin's three pillars impact the risk factors, the progression, and the management of heart disease.  The purpose of this lesson is to provide a high-level overview of the heart, heart disease, and how the Pritikin lifestyle positively impacts risk factors.  Exercise Biomechanics Clinical staff led group instruction and group discussion with PowerPoint presentation and patient guidebook. To enhance the learning environment the use of posters, models and videos may be added. Patients will learn how the structural parts of their bodies function and how these functions impact their daily activities, movement, and exercise. Patients will learn how to promote a neutral spine, learn how to manage pain, and identify ways to improve their physical movement in order to promote healthy living. The purpose of this lesson is to expose patients to common physical limitations that impact physical activity. Participants will learn practical ways to adapt and manage aches and pains, and to minimize their effect on regular exercise. Patients will learn how to maintain good posture while sitting, walking, and lifting.  Balance Training and Fall Prevention  Clinical staff led group instruction and group discussion with PowerPoint presentation and patient guidebook. To enhance the learning environment the use of posters, models and videos may be added. At the conclusion of this workshop, patients will understand the importance of their sensorimotor skills (vision, proprioception, and the  vestibular system) in maintaining their ability to balance as they age. Patients will apply a variety of balancing exercises that are appropriate for their current level of function. Patients will understand the common causes for poor balance, possible solutions to these problems, and ways to modify their physical environment in order to minimize their fall risk. The purpose of this lesson is to teach patients about the importance of maintaining balance as they age and ways to minimize their risk of falling.  WORKSHOPS   Nutrition:  Fueling a Scientist, research (physical sciences) led group instruction and group discussion with PowerPoint presentation and patient guidebook. To enhance the learning environment the use of posters, models and videos may be added. Patients will review the foundational principles of the [place] and understand what constitutes a serving size in each of the food groups. Patients will also learn Pritikin-friendly foods that are better choices when away from home and review make-ahead meal and snack options. Calorie density will be reviewed and applied to three nutrition priorities: weight maintenance, weight loss, and weight gain. The purpose of this lesson is to reinforce (in a group setting) the key concepts around what patients are recommended to eat and how to apply these guidelines when away from home by planning and selecting Pritikin-friendly options. Patients will understand how calorie density may be adjusted for different weight management goals.  Mindful Eating  Clinical staff led group instruction and group discussion with PowerPoint presentation and patient guidebook. To enhance  the learning environment the use of posters, models and videos may be added. Patients will briefly review the concepts of the [place] and the importance of low-calorie dense foods. The concept of mindful eating will be introduced as well as the importance of paying attention  to internal hunger signals. Triggers for non-hunger eating and techniques for dealing with triggers will be explored. The purpose of this lesson is to provide patients with the opportunity to review the basic principles of the [place], discuss the value of eating mindfully and how to measure internal cues of hunger and fullness using the Hunger Scale. Patients will also discuss reasons for non-hunger eating and learn strategies to use for controlling emotional eating.  Targeting Your Nutrition Priorities Clinical staff led group instruction and group discussion with PowerPoint presentation and patient guidebook. To enhance the learning environment the use of posters, models and videos may be added. Patients will learn how to determine their genetic susceptibility to disease by reviewing their family history. Patients will gain insight into the importance of diet as part of an overall healthy lifestyle in mitigating the impact of genetics and other environmental insults. The purpose of this lesson is to provide patients with the opportunity to assess their personal nutrition priorities by looking at their family history, their own health history and current risk factors. Patients will also be able to discuss ways of prioritizing and modifying the [place] for their highest risk areas  Menu  Clinical staff led group instruction and group discussion with PowerPoint presentation and patient guidebook. To enhance the learning environment the use of posters, models and videos may be added. Using menus brought in from [employer], or printed from [employer], patients will apply the [place] dining out guidelines that were presented in the [employer] video. Patients will also be able to practice these guidelines in a variety of provided scenarios. The purpose of this lesson is to provide patients with the opportunity to practice hands-on learning of the [place] with actual menus and practice scenarios.  Label Reading Clinical staff led group instruction and group discussion with PowerPoint presentation and patient guidebook. To enhance the learning environment the use of posters, models and videos may be added. Patients will review and discuss the Pritikin label reading guidelines presented in Pritikin's Label Reading Educational series video. Using fool labels brought in from local grocery stores and markets, patients will apply the label reading guidelines and determine if the packaged food meet the Pritikin guidelines. The purpose of this lesson is to provide patients with the opportunity to review, discuss, and practice hands-on learning of the Pritikin Label Reading guidelines with actual packaged food labels. [place] Workshops are designed to teach patients ways to prepare quick, simple, and affordable recipes at home. The importance of nutrition's role in chronic disease risk reduction is reflected in its emphasis in the overall Pritikin program. By learning how to prepare essential core Pritikin Eating Plan recipes, patients will increase control over what they eat; be able to customize the flavor of foods without the use of added salt, sugar, or fat; and improve the quality of the food they consume. By learning a set of core recipes which are easily assembled, quickly prepared, and affordable, patients are more likely to prepare more healthy foods at home. These workshops focus on convenient breakfasts, simple entres, side dishes, and desserts which can be prepared with minimal effort and  are consistent with nutrition recommendations for cardiovascular risk reduction. Cooking [employer] are taught by a Engineer, materials (RD) who has been trained by the [employer]. The chef or RD has a clear understanding of the importance of minimizing - if not completely eliminating -  added fat, sugar, and sodium in recipes. Throughout the series of [place] Workshop sessions, patients will learn about healthy ingredients and efficient methods of cooking to build confidence in their capability to prepare    Cooking School weekly topics:  Adding Flavor- Sodium-Free  Fast and Healthy Breakfasts  Powerhouse Plant-Based Proteins  Satisfying Salads and Dressings  Simple Sides and Sauces  International Cuisine-Spotlight on the [place] Zones  Delicious Desserts  Savory Soups  Efficiency Cooking - Meals in a Snap  Tasty Appetizers and Snacks  Comforting Weekend Breakfasts  One-Pot Wonders   Fast Evening Meals  Easy [place] (Psychosocial): New Thoughts, New Behaviors Clinical staff led group instruction and group discussion with PowerPoint presentation and patient guidebook. To enhance the learning environment the use of posters, models and videos may be added. Patients will learn and practice techniques for developing effective health and lifestyle goals. Patients will be able to effectively apply the goal setting process learned to develop at least one new personal goal.  The purpose of this lesson is to expose patients to a new skill set of behavior modification techniques such as techniques setting SMART goals, overcoming barriers, and achieving new thoughts and new behaviors.  Managing Moods and Relationships Clinical staff led group instruction and group discussion with PowerPoint presentation and patient guidebook. To enhance the learning environment the use of posters, models and videos may be added. Patients will learn how emotional and chronic stress factors can impact their health and relationships. They will learn healthy ways to manage their moods and utilize positive coping mechanisms. In addition, ICR patients will learn ways to improve communication skills. The purpose of this lesson is to  expose patients to ways of understanding how one's mood and health are intimately connected. Developing a healthy outlook can help build positive relationships and connections with others. Patients will understand the importance of utilizing effective communication skills that include actively listening and being heard. They will learn and understand the importance of the "4 Cs" and especially Connections in fostering of a Healthy Mind-Set.  Healthy Sleep for a Healthy Heart Clinical staff led group instruction and group discussion with PowerPoint presentation and patient guidebook. To enhance the learning environment the use of posters, models and videos may be added. At the conclusion of this workshop, patients will be able to demonstrate knowledge of the importance of sleep to overall health, well-being, and quality of life. They will understand the symptoms of, and treatments for, common sleep disorders. Patients will also be able to identify daytime and nighttime behaviors which impact sleep, and they will be able to apply these tools to help manage sleep-related challenges. The purpose of this lesson is to provide patients with a general overview of sleep and outline the importance of quality sleep. Patients will learn about a few of the most common sleep disorders. Patients will also be introduced to the concept of "sleep hygiene," and discover ways to self-manage certain sleeping problems through simple daily behavior changes. Finally, the workshop will motivate patients by clarifying the links between quality sleep and their goals of heart-healthy living.   Recognizing and Reducing Stress Clinical  staff led group instruction and group discussion with PowerPoint presentation and patient guidebook. To enhance the learning environment the use of posters, models and videos may be added. At the conclusion of this workshop, patients will be able to understand the types of stress reactions, differentiate  between acute and chronic stress, and recognize the impact that chronic stress has on their health. They will also be able to apply different coping mechanisms, such as reframing negative self-talk. Patients will have the opportunity to practice a variety of stress management techniques, such as deep abdominal breathing, progressive muscle relaxation, and/or guided imagery.  The purpose of this lesson is to educate patients on the role of stress in their lives and to provide healthy techniques for coping with it.  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   Learning Preferences Pictoral;Skilled Demonstration             Education Topics:  Knowledge Questionnaire Score:  Knowledge Questionnaire Score - 09/27/21 1315       Knowledge Questionnaire Score   Pre Score 22/24             Core Components/Risk Factors/Patient Goals at Admission:  Personal Goals and Risk Factors at Admission - 09/27/21 1148       Core Components/Risk Factors/Patient Goals on Admission    Weight Management Yes;Obesity;Weight Loss    Intervention Weight Management/Obesity: Establish reasonable short term and long term weight goals.;Obesity: Provide education and appropriate resources to help participant work on and attain dietary goals.    Admit Weight 191 lb 12.8 oz (87 kg)    Expected Outcomes Weight Loss: Understanding of general recommendations for a balanced deficit meal plan, which promotes 1-2 lb weight loss per week and includes a negative energy balance of [phone] kcal/d;Short Term: Continue to assess and modify interventions until short term weight is achieved;Long Term: Adherence to nutrition and physical activity/exercise program aimed toward attainment of  established weight goal    Hypertension Yes    Intervention Provide education on lifestyle modifcations including regular physical activity/exercise, weight management, moderate sodium restriction and increased consumption of fresh fruit, vegetables, and low fat dairy, alcohol moderation, and smoking cessation.;Monitor prescription use compliance.    Expected Outcomes Short Term: Continued assessment and intervention until BP is < 140/43m HG in hypertensive participants. < 130/883mHG in hypertensive participants with diabetes, heart failure or chronic kidney disease.;Long Term: Maintenance of blood pressure at goal levels.    Lipids Yes    Intervention Provide education and support for participant on nutrition & aerobic/resistive exercise along with prescribed medications to achieve LDL <7056mHDL >70m5m  Expected Outcomes Short Term: Participant states understanding of desired cholesterol values and is compliant with medications prescribed. Participant is following exercise prescription and nutrition guidelines.;Long Term: Cholesterol controlled with medications as prescribed, with individualized exercise RX and with personalized nutrition plan. Value goals: LDL < 70mg73mL > 40 mg.    Stress Yes    Intervention Offer individual and/or small group education and counseling on adjustment to heart disease, stress management and health-related lifestyle change. Teach and support self-help strategies.;Refer participants experiencing significant psychosocial distress to appropriate mental health specialists for further evaluation and treatment. When possible, include family members and significant others in education/counseling sessions.    Expected  Outcomes Short Term: Participant demonstrates changes in health-related behavior, relaxation and other stress management skills, ability to obtain effective social support, and compliance with psychotropic medications if prescribed.;Long Term: Emotional wellbeing  is indicated by absence of clinically significant psychosocial distress or social isolation.    Personal Goal Other Yes    Personal Goal Live a healthier lifestyle. Develop a definitive exercise routine.    Intervention Offer individual and small group education on the [facility] lifestyle plan. Develop individualized aerobic, resistance, and flexibility program that patient can follow and maintain.    Expected Outcomes Patient will follow recommendations provided on living a healthier lifestyle including healthy eating, healthy mindset, and regular exercise.             Core Components/Risk Factors/Patient Goals Review:   Goals and Risk Factor Review     Row Name 10/03/21 1422 10/12/21 1351           Core Components/Risk Factors/Patient Goals Review   Personal Goals Review Weight Management/Obesity;Stress;Hypertension;Lipids Weight Management/Obesity;Stress;Hypertension;Lipids      Review Patient started Cardiac Rehab today, tolerated well, vss, denies pain, no adverse signs or symptoms reported. Patient has completed 1 exercise session.  Unable to assess goals at this time.  Pt to return to exercise on 8/28      Expected Outcomes Patient will maintain a healthy lifestyle, as he is provided the knowledge and tools neccessary to complete goal; he will increase exercise stamina during ICR program. Patient will maintain a healthy lifestyle, as he is provided the knowledge and tools neccessary to complete goal; he will increase exercise stamina during ICR program.               Core Components/Risk Factors/Patient Goals at Discharge (Final Review):   Goals and Risk Factor Review - 10/12/21 1351       Core Components/Risk Factors/Patient Goals Review   Personal Goals Review Weight Management/Obesity;Stress;Hypertension;Lipids    Review Patient has completed 1 exercise session.  Unable to assess goals at this time.  Pt to return to exercise on 8/28    Expected Outcomes Patient will  maintain a healthy lifestyle, as he is provided the knowledge and tools neccessary to complete goal; he will increase exercise stamina during ICR program.             ITP Comments:  ITP Comments     Row Name 09/27/21 1140 10/12/21 1347         ITP Comments Medical Director- Dr. Fransico Him, MD. Introduction to [facility] Education Program Intensive Cardiac Rehab. Initial [facility] Orientation Packet Reviewed with the patient. 30 day ITP review, Pt has completed 1 exercise session.  Pt has scheduled absences from cardiac rehab.  Plans to return on 8/28.               Comments: Pt is making expected progress toward personal goals after completing 1sessions. Recommend continued exercise and life style modification education including  stress management and relaxation techniques to decrease cardiac risk profile.   Cherre Huger, BSN Cardiac and Training and development officer

## 2021-10-14 ENCOUNTER — Encounter (HOSPITAL_COMMUNITY)
Admission: RE | Admit: 2021-10-14 | Discharge: 2021-10-14 | Disposition: A | Discharge status: Home / Self Care | Payer: BC Managed Care – PPO | Source: Ambulatory Visit | Attending: Cardiology | Admitting: Cardiology

## 2021-10-14 DIAGNOSIS — Z955 Presence of coronary angioplasty implant and graft: Secondary | ICD-10-CM

## 2021-10-14 DIAGNOSIS — I480 Paroxysmal atrial fibrillation: Secondary | ICD-10-CM | POA: Diagnosis not present

## 2021-10-17 ENCOUNTER — Encounter (HOSPITAL_COMMUNITY)
Admission: RE | Admit: 2021-10-17 | Discharge: 2021-10-17 | Disposition: A | Discharge status: Home / Self Care | Payer: BC Managed Care – PPO | Source: Ambulatory Visit | Attending: Cardiology | Admitting: Cardiology

## 2021-10-17 DIAGNOSIS — I480 Paroxysmal atrial fibrillation: Secondary | ICD-10-CM | POA: Diagnosis not present

## 2021-10-17 DIAGNOSIS — Z955 Presence of coronary angioplasty implant and graft: Secondary | ICD-10-CM

## 2021-10-19 ENCOUNTER — Encounter (HOSPITAL_COMMUNITY)
Admission: RE | Admit: 2021-10-19 | Discharge: 2021-10-19 | Disposition: A | Discharge status: Home / Self Care | Payer: BC Managed Care – PPO | Source: Ambulatory Visit | Attending: Cardiology | Admitting: Cardiology

## 2021-10-19 DIAGNOSIS — Z955 Presence of coronary angioplasty implant and graft: Secondary | ICD-10-CM

## 2021-10-19 DIAGNOSIS — I480 Paroxysmal atrial fibrillation: Secondary | ICD-10-CM | POA: Diagnosis not present

## 2021-10-19 NOTE — Progress Notes (Signed)
CARDIAC REHAB PHASE 2  Reviewed home exercise with pt today. Pt is tolerating exercise well. Pt will continue to exercise on his own by walking, resistance bands and stationary bike for 30-45 minutes per session 4 days a week in addition to the 3 days in CRP2. Advised pt on THRR, RPE scale, hydration and temperature/humidity precautions. Reinforced NTG use, S/S to stop exercise and when to call MD vs 911. Encouraged warm up cool down and stretches with exercise sessions. Pt verbalized understanding, all questions were answered and pt was given a copy to take home.    Harrie Jeans ACSM-CEP 10/19/2021 8:51 AM

## 2021-10-19 NOTE — Progress Notes (Addendum)
[name] in today for cardiac rehab.  During the second station as he was receiving instruction on home exercise, he reported a "fluttering: sensation when he first wakes up this is accompanied with shortness of breath. RN consult - spoke with pt who is not feeling this presently and tolerating exercise well. .  Pt with history of PAF. Normally here in rehab he is in SR with frequent PAC's  today he is in afib rate 70- 90s with exercise.  On Cardizem prn as well as metoprolol is prn. Currently not anticoagulated.  CHA2DS2-VASc of 1. On plavix post DES. Did not notice HR on his new watch that he just got what his HR was during the episodes.  He is not sure what his pacemaker may be doing during that time. Sees [facility]  on 10/20 with pace checker one week prior on 10/13.  Sees [facility] on 9/25. Concerned as he did not present with any symptoms prior to his cardiac event and he had a 95% blockage. He sent Dr. Shari Prows a message through MyChart however I do not see the encounter. Will route this note to Dr. Shari Prows and Dr. Elberta Fortis for further evaluation . Alanson Aly, BSN Cardiac and Emergency planning/management officer

## 2021-10-20 ENCOUNTER — Encounter: Payer: Self-pay | Admitting: Internal Medicine

## 2021-10-20 ENCOUNTER — Telehealth: Payer: Self-pay | Admitting: Cardiology

## 2021-10-20 ENCOUNTER — Encounter: Payer: BC Managed Care – PPO | Admitting: Internal Medicine

## 2021-10-20 VITALS — BP 102/70 | HR 78 | Ht 66.0 in | Wt 190.4 lb

## 2021-10-20 DIAGNOSIS — I4729 Other ventricular tachycardia: Secondary | ICD-10-CM | POA: Diagnosis not present

## 2021-10-20 DIAGNOSIS — I6523 Occlusion and stenosis of bilateral carotid arteries: Secondary | ICD-10-CM

## 2021-10-20 DIAGNOSIS — I2510 Atherosclerotic heart disease of native coronary artery without angina pectoris: Secondary | ICD-10-CM | POA: Diagnosis not present

## 2021-10-20 DIAGNOSIS — I480 Paroxysmal atrial fibrillation: Secondary | ICD-10-CM

## 2021-10-20 NOTE — Telephone Encounter (Signed)
Remote transmission received 10/20/21.  Presenting rhythm today ~ AS/VS 83 bpm. AT/AF burden 0%. 1 NSVT event logged, <20 beats in duration. 35 fast A&V events logged, EGM's reviewed and correlate with increased activity per patient. No other findings noted.

## 2021-10-20 NOTE — Telephone Encounter (Signed)
Called patient back about his message. Patient stated he has been having SOB, Heart Fluttering, and  heart rate of 90 which is high for him. Patient stated he has been feeling sluggish since getting his pacemaker. Patient stated he is concerned. If formed patient that a message could be sent to device clinic to see if anything is going on with his device. With everything else will have patient see DOD today to get evaluated for his SOB. Patient agreed to plan.

## 2021-10-20 NOTE — Patient Instructions (Signed)
Medication Instructions:  Your physician recommends that you continue on your current medications as directed. Please refer to the Current Medication list given to you today.  *If you need a refill on your cardiac medications before your next appointment, please call your pharmacy*   Lab Work: 10/21/21: Potassium and Magnesium  If you have labs (blood work) drawn today and your tests are completely normal, you will receive your results only by: MyChart Message (if you have MyChart) OR A paper copy in the mail If you have any lab test that is abnormal or we need to change your treatment, we will call you to review the results.   Testing/Procedures: NONE    Follow-Up: As scheduled At [facility], you and your health needs are our priority.  As part of our continuing mission to provide you with exceptional heart care, we have created designated Provider Care Teams.  These Care Teams include your primary Cardiologist (physician) and Advanced Practice Providers (APPs -  Physician Assistants and Nurse Practitioners) who all work together to provide you with the care you need, when you need it.      Important Information About Sugar

## 2021-10-20 NOTE — Progress Notes (Signed)
Cardiology Office Note:    Date:  10/20/2021   ID:  Brandon Madden, DOB 05/22/1960, MRN [id number]  PCP:  SwazilandJordan, Betty G, MD   [facility] Providers Cardiologist:  Meriam SpragueHeather E Pemberton, MD Electrophysiologist:  Will Jorja LoaMartin Camnitz, MD     Referring MD: [place]azilandJordan, Betty G, MD   CC: DOD SOB and palpitations  History of Present Illness:    Brandon PaganiniKevin Clagg is a 61 y.o. male with a hx of CAD s/p LAD PCI 06/2021, PAF with recent PPM.  He is feeling worse since his recent PPM. And Heart fluttering and SOB and concern of stroke.  Patient notes that he is doing had prior history of PAF on an IL[facility] ([facility]  He had a distant history of syncope with aura and passed out. He recounts his prior syncope; Found to have heart block and his PPM  No chest pain or pressure.  He has never truly had chest pain and described his anginal equivalent as a pressing feeling on his chest. He is going through a lot of stress right now. He has no SOB persay, but when he has the heart flutters that have returned, he has SOB.  He describes this as a pulling feeling. He notes his heart flutters have been recurrent. He has a device transmission- no AT/AF.  One episode of NSVT.    Past Medical History:  Diagnosis Date   CAD (coronary artery disease) 07/13/2021   CCTA 06/2021: CAC score 260 (84th percentile); mLAD 70-99; mLAD FFR pos (0.60) Status post DES to the mid LAD 06/2021   Carotid stenosis 08/11/2021   Carotid US 07/2021: Bilat ICA 1-39   Deafness in right ear    Family history of pancreatic cancer    Family history of prostate cancer    Family history of skin cancer    Hyperlipidemia LDL goal <70 08/20/2018   PAF (paroxysmal atrial fibrillation) (HCC) 05/06/2021   Sleep apnea     Past Surgical History:  Procedure Laterality Date   CARDIAC CATHETERIZATION     CORONARY STENT INTERVENTION N/A 07/15/2021   Procedure: CORONARY STENT INTERVENTION;  Surgeon: Lyn RecordsSmith, Henry W, MD;   Location: [facility];  Service: Cardiovascular;  Laterality: N/A;   INSERT / REPLACE / REMOVE PACEMAKER     INTRAVASCULAR ULTRASOUND/IVUS N/A 07/15/2021   Procedure: Intravascular Ultrasound/IVUS;  Surgeon: Lyn RecordsSmith, Henry W, MD;  [facility];  Service: Cardiovascular;  Laterality: N/A;   LEFT HEART CATH AND CORONARY ANGIOGRAPHY N/A 07/15/2021   Procedure: LEFT HEART CATH AND CORONARY ANGIOGRAPHY;  Surgeon: Lyn RecordsSmith, Henry W, MD;  Location: [facility] CV [facility];  Service: Cardiovascular;  Laterality: N/A;   LOOP RECORDER REMOVAL N/A 09/01/2021   Procedure: LOOP RECORDER REMOVAL;  Surgeon: Regan Lemmingamnitz, Will Martin, MD;  Location: [facility];  Service: Cardiovascular;  Laterality: N/A;   NASAL SEPTUM SURGERY     PACEMAKER IMPLANT N/A 09/01/2021   Procedure: PACEMAKER IMPLANT;  Surgeon: Regan Lemmingamnitz, Will Martin, MD;  Location: [facility];  Service: Cardiovascular;  Laterality: N/A;    Current Medications: Current Meds  Medication Sig   acetaminophen (TYLENOL) 500 MG tablet Take 500-1,000 mg by mouth every 6 (six) hours as needed (pain.).   aspirin EC 81 MG tablet Take 1 tablet (81 mg total) by mouth daily.   aspirin-acetaminophen-caffeine (EXCEDRIN MIGRAINE) 250-250-65 MG tablet Take 1-2 tablets by mouth 2 (two) times daily as needed for headache.   clopidogrel (PLAVIX)  75 MG tablet Take 1 tablet (75 mg total) by mouth daily.   diltiazem (CARDIZEM) 30 MG tablet Take 1 tablet (30 mg total) by mouth 4 (four) times daily as needed.   ezetimibe (ZETIA) 10 MG tablet Take 1 tablet (10 mg total) by mouth daily.   ibuprofen (ADVIL) 200 MG tablet Take 400-600 mg by mouth every 8 (eight) hours as needed (pain.).   Multiple Vitamins-Minerals (MULTI FOR HIM) TABS Take 1 tablet by mouth in the morning.   nitroGLYCERIN (NITROSTAT) 0.4 MG SL tablet Place 1 tablet (0.4 mg total) under the tongue every 5 (five) minutes as needed for chest pain.   Polyethyl Glycol-Propyl Glycol  (LUBRICANT EYE DROPS) 0.4-0.3 % SOLN Place 1-2 drops into both eyes 3 (three) times daily as needed (dry/irritated eyes.).   rosuvastatin (CRESTOR) 20 MG tablet Take 2 tablets (40 mg total) by mouth daily.     Allergies:   Bee venom   Social History   Socioeconomic History   Marital status: Married    Spouse name: [name]   Number of children: 4   Years of education: Law   Highest education level: Professional school degree (e.g., MD, DDS, DVM, JD)  Occupational History   Occupation: [employer]    Comment: [employer]  Tobacco Use   Smoking status: Never   Smokeless tobacco: Never  Vaping Use   Vaping Use: Never used  Substance and Sexual Activity   Alcohol use: No    Comment: quit: 2011   Drug use: No   Sexual activity: Yes  Other Topics Concern   Not on file  Social History Narrative   Right handed   Patient lives at home with his family.   Caffeine Use: 2 cups daily   Social Determinants of Health   Financial Resource Strain: Not on file  Food Insecurity: Not on file  Transportation Needs: Not on file  Physical Activity: Not on file  Stress: Not on file  Social Connections: Not on file     Family History: The patient's family history includes Heart attack in his father; Pancreatic cancer in his maternal grandmother; Prostate cancer in his brother and brother; Skin cancer in his sister.  ROS:   Please see the history of present illness.    All other systems reviewed and are negative.  EKGs/Labs/Other Studies Reviewed:    The following studies were reviewed today:   EKG:  EKG is  ordered today.  The ekg ordered today demonstrates  10/20/21: SR rate 73 Device transmission: no AFib  LEFT HEART CATH AND CORONARY ANGIOGRAPHY, LEFT HEART CATH AND CORONARY ANGIOGRAPHY, LEFT HEART CATH AND CORONARY ANGIOGRAPHY 07/15/2021  Narrative CONCLUSIONS: 95% mid LAD treated with IVUS guided stent implantation reducing stenosis to 0% with TIMI grade III flow using an 18 x 2.75  Onyx postdilated to 3.25 mm in diameter. The proximal to mid LAD contains 30% narrowing Left main is normal Circumflex is normal Right coronary is dominant and normal LV function is normal with LVEDP 15 mmHg.  EF 55%.  RECOMMENDATIONS:  Aspirin and Plavix x6 months.  After 6 months can de-escalate to monotherapy with either aspirin or Plavix. Aggressive risk factor modification. Outpatient status.     ECHO COMPLETE WO IMAGING ENHANCING AGENT 06/03/2020  Narrative ECHOCARDIOGRAM REPORT    Patient Name:   Brandon Madden  Date of Exam: 06/03/2020 Medical Rec #:  [id number]     Height:       66.0 in Accession #:    [id number]  Weight:       199.6 lb Date of Birth:  1960/08/22     BSA:          1.998 m Patient Age:    61 years      BP:           110/60 mmHg Patient Gender: M             HR:           71 bpm. Exam Location:  [address]  Procedure: 2D Echo, Cardiac Doppler and Color Doppler  Indications:    R55 Syncope; R42 Dizziness; I25.10 CAD  History:        Patient has no prior history of Echocardiogram examinations. Risk Factors:Dyslipidemia. Obesity.  Sonographer:    Cathie Beams RCS Referring Phys: [id number] HEATHER E PEMBERTON  IMPRESSIONS   1. Left ventricular ejection fraction, by estimation, is 55 to 60%. The left ventricle has normal function. The left ventricle has no regional wall motion abnormalities. Left ventricular diastolic parameters are consistent with Grade I diastolic dysfunction (impaired relaxation). 2. Right ventricular systolic function is normal. The right ventricular size is normal. 3. The mitral valve is normal in structure. Trivial mitral valve regurgitation. 4. The aortic valve is normal in structure. There is mild thickening of the aortic valve. Aortic valve regurgitation is trivial. No aortic stenosis is present.  Comparison(s): No prior Echocardiogram.  FINDINGS Left Ventricle: Left ventricular ejection fraction, by estimation, is 55 to  60%. The left ventricle has normal function. The left ventricle has no regional wall motion abnormalities. The left ventricular internal cavity size was normal in size. There is borderline concentric left ventricular hypertrophy. Left ventricular diastolic parameters are consistent with Grade I diastolic dysfunction (impaired relaxation). Normal left ventricular filling pressure.  Right Ventricle: The right ventricular size is normal. No increase in right ventricular wall thickness. Right ventricular systolic function is normal.  Left Atrium: Left atrial size was normal in size.  Right Atrium: Right atrial size was normal in size.  Pericardium: There is no evidence of pericardial effusion.  Mitral Valve: The mitral valve is normal in structure. Trivial mitral valve regurgitation.  Tricuspid Valve: The tricuspid valve is normal in structure. Tricuspid valve regurgitation is trivial.  Aortic Valve: The aortic valve is normal in structure. There is mild thickening of the aortic valve. Aortic valve regurgitation is trivial. No aortic stenosis is present.  Pulmonic Valve: The pulmonic valve was normal in structure. Pulmonic valve regurgitation is trivial.  Aorta: The aortic root and ascending aorta are structurally normal, with no evidence of dilitation.  IAS/Shunts: No atrial level shunt detected by color flow Doppler.   LEFT VENTRICLE PLAX 2D LVIDd:         4.20 cm  Diastology LVIDs:         2.35 cm  LV e' medial:    7.94 cm/s LV PW:         1.10 cm  LV E/e' medial:  8.0 LV IVS:        1.00 cm  LV e' lateral:   11.20 cm/s LVOT diam:     2.00 cm  LV E/e' lateral: 5.7 LV SV:         60 LV SV Index:   30 LVOT Area:     3.14 cm   RIGHT VENTRICLE RV Basal diam:  2.60 cm RV S prime:     10.00 cm/s TAPSE (M-mode): 1.2 cm  LEFT ATRIUM  Index       RIGHT ATRIUM           Index LA diam:        3.60 cm 1.80 cm/m  RA Area:     13.50 cm LA Vol (A2C):   29.6 ml 14.81 ml/m RA  Volume:   32.60 ml  16.32 ml/m LA Vol (A4C):   28.9 ml 14.46 ml/m LA Biplane Vol: 29.7 ml 14.86 ml/m AORTIC VALVE LVOT Vmax:   99.20 cm/s LVOT Vmean:  58.500 cm/s LVOT VTI:    0.191 m  AORTA Ao Root diam: 3.10 cm  MITRAL VALVE MV Area (PHT): 3.23 cm    SHUNTS MV Decel Time: 235 msec    Systemic VTI:  0.19 m MV E velocity: 63.53 cm/s  Systemic Diam: 2.00 cm MV A velocity: 66.40 cm/s MV E/A ratio:  0.96      LONG TERM MONITOR (8-14 DAYS) INTERPRETATION 04/27/2020  Narrative  Patch wear time was 13 days and 9 hours  Predominant rhythm was NSR with average HR 84 (ranged from 48-172bpm)  Rare SVE, rare PVCs <1%  No Afib, significant pauses or sustained arrhythmias  Triggered events mainly correlated with PACs.  Overall, normal cardiac monitor   Patch Wear Time:  13 days and 9 hours (2022-02-17T21:23:14-0500 to 2022-03-03T06:46:40-0500)  Patient had a min HR of 48 bpm, max HR of 172 bpm, and avg HR of 84 bpm. Predominant underlying rhythm was Sinus Rhythm. Isolated SVEs were rare (<1.0%), and no SVE Couplets or SVE Triplets were present. Isolated VEs were rare (<1.0%), and no VE Couplets or VE Triplets were present.  Laurance Flatten, MD   No results found for this or any previous visit from the past 3650 days.   CT CARDIAC SCORING (SELF PAY ONLY) 04/30/2020  Addendum 05/02/2020 10:04 PM ADDENDUM REPORT: 05/02/2020 22:02  CLINICAL DATA:  Risk stratification  EXAM: Coronary Calcium Score  TECHNIQUE: The patient was scanned on a [employer] scanner. Axial non-contrast 3 mm slices were carried out through the heart. The data set was analyzed on a dedicated work station and scored using the Agatson method.  FINDINGS: Non-cardiac: See separate report from [facility].  Ascending Aorta: Normal caliber. No calcifications.  Pericardium: Normal  Coronary arteries: Normal coronary origins.  IMPRESSION: Coronary calcium score of 166. This was  80th percentile for age and sex matched control.  Armanda Magic   Electronically Signed By: Armanda Magic On: 05/02/2020 22:02  Narrative EXAM: OVER-READ INTERPRETATION  CT CHEST  The following report is an over-read performed by radiologist Dr. Trudie Reed of [facility] on 04/30/2020. This over-read does not include interpretation of cardiac or coronary anatomy or pathology. The coronary calcium score interpretation by the cardiologist is attached.  COMPARISON:  None.  FINDINGS: Calcified granuloma in the left upper lobe with some densely calcified left hilar lymph nodes incidentally noted. Within the visualized portions of the thorax there are no other suspicious appearing pulmonary nodules or masses, there is no acute consolidative airspace disease, no pleural effusions, no pneumothorax and no lymphadenopathy. Visualized portions of the upper abdomen are unremarkable. There are no aggressive appearing lytic or blastic lesions noted in the visualized portions of the skeleton.  IMPRESSION: 1. No significant incidental noncardiac findings are noted.  CT CORONARY MORPH W/CTA COR W/SCORE W/CA W/CM &/OR WO/CM 07/11/2021  Addendum 07/11/2021  2:01 PM ADDENDUM REPORT: 07/11/2021 13:59  CLINICAL DATA:  Chest pain  EXAM: Cardiac/Coronary CTA  TECHNIQUE: A non-contrast, gated  CT scan was obtained with axial slices of 3 mm through the heart for calcium scoring. Calcium scoring was performed using the Agatston method. A 110 kV prospective, gated, contrast cardiac scan was obtained. Gantry rotation speed was 250 msecs and collimation was 0.6 mm. Two sublingual nitroglycerin tablets (0.8 mg) were given. The 3D data set was reconstructed in 5% intervals of the 35-75% of the R-R cycle. Diastolic phases were analyzed on a dedicated workstation using MPR, MIP, and VRT modes. The patient received 95 cc of contrast.  FINDINGS: Image quality: Excellent.  Noise  artifact is: Limited.  Coronary Arteries:  Normal coronary origin.  Right dominance.  Left main: The left main is a large caliber vessel with a normal take off from the left coronary cusp that trifurcates into a LAD, LCX, and ramus intermedius. There is no plaque or stenosis.  Left anterior descending artery: The proximal LAD contains mild calcified plaque (25-49%). There is a severe non-calcified plaque (70-99%) in the mid LAD. The distal LAD contains minimal non-calcified plaque (<25%). The first diagonal contains minimal calcified plaque (<25%). The second diagonal is patent.  Ramus intermedius: Patent with no evidence of plaque or stenosis.  Left circumflex artery: The LCX is non-dominant and patent with no evidence of plaque or stenosis. The LCX gives off 1 obtuse marginal branch with mild mixed density plaque (25-49%).  Right coronary artery: The RCA is dominant with normal take off from the right coronary cusp. There is minimal calcified plaque (<25%) in the proximal segment. The mid and distal segments are patent. The RCA terminates as a PDA and right posterolateral branch without evidence of plaque or stenosis.  Right Atrium: Right atrial size is within normal limits.  Right Ventricle: The right ventricular cavity is within normal limits.  Left Atrium: Left atrial size is normal in size with no left atrial appendage filling defect.  Left Ventricle: The ventricular cavity size is within normal limits.  Pulmonary arteries: Normal in size without proximal filling defect.  Pulmonary veins: Normal pulmonary venous drainage.  Pericardium: Normal thickness without significant effusion or calcium present.  Cardiac valves: The aortic valve is trileaflet without significant calcification. The mitral valve is normal without significant calcification.  Aorta: Normal caliber without significant disease.  Extra-cardiac findings: See attached radiology report  for non-cardiac structures.  IMPRESSION: 1. Coronary calcium score of 260. This was 84th percentile for age-, sex, and race-matched controls.  2. Normal coronary origin with right dominance.  3.  Severe mid LAD stenosis (70-99%).  4.  Mild mixed density plaque (25-49%) in the LCX.  5.  Minimal plaque in the RCA (<25%).  RECOMMENDATIONS: 1. Severe stenosis in the mid LAD. CT FFR will be submitted. Consider symptom-guided anti-ischemic pharmacotherapy as well as risk factor modification per guideline directed care. Invasive coronary angiography recommended with revascularization per published guideline statements.    Narrative EXAM: OVER-READ INTERPRETATION  CT CHEST  The following report is a limited chest CT over-read performed by radiologist Dr. Jeronimo Greaves of [facility] on 07/11/2021. This over-read does not include interpretation of cardiac or coronary anatomy or pathology. The cardiac CTA interpretation by the cardiologist is attached.  COMPARISON:  06/04/2020 CTA chest  FINDINGS: Vascular: Normal aortic caliber. No central pulmonary embolism, on this non-dedicated study.  Mediastinum/Nodes: No imaged thoracic adenopathy.  Lungs/Pleura: No pleural fluid.  Clear imaged lungs.  Upper Abdomen: Mild hepatic steatosis. Normal imaged portions of the spleen, stomach.  Musculoskeletal: No acute osseous abnormality.  IMPRESSION: No  acute findings in the imaged extracardiac chest.  Mild hepatic steatosis.    CT CORONARY FRACTIONAL FLOW RESERVE DATA PREP 07/11/2021 CT CORONARY FRACTIONAL FLOW RESERVE FLUID ANALYSIS 07/11/2021  Narrative EXAM: CT FFR analysis was performed on the original cardiac CTA dataset. Diagrammatic representation of the CT FFR analysis is provided in a separate PDF document in PACS. This dictation was created using the PDF document and an interactive 3D model of the results. The 3D model is not available in the  EMR/PACS.  INTERPRETATION: CT FFR provides simultaneous calculation of pressure and flow across the entire coronary tree. For clinical decision making, CT FFR values should be obtained 1-2 cm distal to the lower border of each stenosis measured. Coronary CTA-related artifacts may impair the diagnostic accuracy of the original cardiac CTA and FFR CT results. *Due to the fact that CT FFR represents a mathematically-derived analysis, it is recommended that the results be interpreted as follows:  1. CT FFR >0.80: Low likelihood of hemodynamic significance. 2. CT FFR 0.76-0.80: Borderline likelihood of hemodynamic significance. 3. CT FFR =< 0.75: High likelihood of hemodynamic significance.  *Coronary CT Angiography-derived Fractional Flow Reserve Testing in Patients with Stable Coronary Artery Disease: Recommendations on Interpretation and Reporting. Radiology: Cardiothoracic Imaging. 2019;1(5):e190050  FINDINGS: 1. Left Main: 0.99; low likelihood of hemodynamic significance.  2. Prox LAD: 0.96; low likelihood of hemodynamic significance. 3. Mid LAD: 0.60; high likelihood of hemodynamic significance. 4. LCX: 0.92; low likelihood of hemodynamic significance. 5. RCA: 0.93; low likelihood of hemodynamic significance.  IMPRESSION:  1.  Mid LAD is positive by CT FFR (0.60).  Lennie Odor, MD   VAS US CAROTID DUPLEX BILATERAL 08/10/2021  Narrative Carotid Arterial Duplex Study  Patient Name:  GALEN RUSSMAN  Date of Exam:   08/10/2021 Medical Rec #: [id number]     Accession #:    [id number] Date of Birth: [birth year]     Patient Gender: M Patient Age:   34 years Exam Location:  [facility] Procedure:      VAS US CAROTID Referring Phys: Tereso Newcomer   --------------------------------------------------------------------------------  Indications:  Right bruit. Risk Factors: Hypertension, hyperlipidemia, no history of smoking, coronary artery disease.  Performing Technologist:  Jake Seats RDMS, RVT, RDCS   Examination Guidelines: A complete evaluation includes B-mode imaging, spectral Doppler, color Doppler, and power Doppler as needed of all accessible portions of each vessel. Bilateral testing is considered an integral part of a complete examination. Limited examinations for reoccurring indications may be performed as noted.   Right Carotid Findings: +----------+--------+--------+--------+------------------+--------+           PSV cm/sEDV cm/sStenosisPlaque DescriptionComments +----------+--------+--------+--------+------------------+--------+ CCA Prox  71      19                                         +----------+--------+--------+--------+------------------+--------+ CCA Mid   92      21                                         +----------+--------+--------+--------+------------------+--------+ CCA Distal92      25                                         +----------+--------+--------+--------+------------------+--------+  ICA Prox  61      23              homogeneous                +----------+--------+--------+--------+------------------+--------+ ICA Mid   62      26      1-39%                              +----------+--------+--------+--------+------------------+--------+ ICA Distal64      28                                         +----------+--------+--------+--------+------------------+--------+ ECA       93      19                                         +----------+--------+--------+--------+------------------+--------+  +----------+--------+-------+----------------+-------------------+           PSV cm/sEDV cmsDescribe        Arm Pressure (mmHG) +----------+--------+-------+----------------+-------------------+ Subclavian119     7      Multiphasic, WNL120                  +----------+--------+-------+----------------+-------------------+  +---------+--------+--+--------+--+---------+ VertebralPSV cm/s37EDV cm/s15Antegrade +---------+--------+--+--------+--+---------+    Left Carotid Findings: +----------+--------+--------+--------+------------------+--------+           PSV cm/sEDV cm/sStenosisPlaque DescriptionComments +----------+--------+--------+--------+------------------+--------+ CCA Prox  93      24      <50%    heterogenous               +----------+--------+--------+--------+------------------+--------+ CCA Mid   82      24                                         +----------+--------+--------+--------+------------------+--------+ CCA Distal66      19                                         +----------+--------+--------+--------+------------------+--------+ ICA Prox  51      21              homogeneous                +----------+--------+--------+--------+------------------+--------+ ICA Mid   61      24      1-39%                              +----------+--------+--------+--------+------------------+--------+ ICA Distal73      29                                         +----------+--------+--------+--------+------------------+--------+ ECA       112     20                                         +----------+--------+--------+--------+------------------+--------+  +----------+--------+--------+----------------+-------------------+  PSV cm/sEDV cm/sDescribe        Arm Pressure (mmHG) +----------+--------+--------+----------------+-------------------+ Subclavian122     8       Multiphasic, WNL120                 +----------+--------+--------+----------------+-------------------+  +---------+--------+--+--------+--+---------+ VertebralPSV cm/s31EDV cm/s11Antegrade +---------+--------+--+--------+--+---------+      Summary: Right Carotid:  Velocities in the right ICA are consistent with a 1-39% stenosis.  Left Carotid: Velocities in the left ICA are consistent with a 1-39% stenosis. Non-hemodynamically significant plaque <50% noted in the CCA.  Vertebrals:  Bilateral vertebral arteries demonstrate antegrade flow. Subclavians: Normal flow hemodynamics were seen in bilateral subclavian arteries.  *See table(s) above for measurements and observations.      Recent Labs: 08/31/2021: Hemoglobin 16.7; Platelets 179 09/29/2021: ALT 36; BUN 19; Creatinine, Ser 1.05; Potassium 4.8; Sodium 140  Recent Lipid Panel    Component Value Date/Time   CHOL 142 09/29/2021 0901   TRIG 64 09/29/2021 0901   HDL 51 09/29/2021 0901   CHOLHDL 2.8 09/29/2021 0901   CHOLHDL 5 03/30/2020 1011   VLDL 24.2 03/30/2020 1011   LDLCALC 78 09/29/2021 0901        Physical Exam:    VS:  BP 102/70   Pulse 78   Ht  (1.676 m)   Wt 86.4 kg   SpO2 96%   BMI 30.73 kg/m     Wt Readings from Last 3 Encounters:  10/20/21 86.4 kg  09/27/21 87 kg  09/01/21 86.2 kg    GEN:  Well nourished, well developed in no acute distress HEENT: Normal LYMPHATICS: No lymphadenopathy CARDIAC: RRR, no murmurs, rubs, gallops RESPIRATORY:  Clear to auscultation without rales, wheezing or rhonchi  ABDOMEN: Soft, non-tender, non-distended MUSCULOSKELETAL:  No edema; No deformity  SKIN: Warm and dry NEUROLOGIC:  Alert and oriented x 3 PSYCHIATRIC:  Anxious affect   ASSESSMENT:    1. NSVT (nonsustained ventricular tachycardia) (HCC)   2. Bilateral carotid artery stenosis   3. Coronary artery disease involving native coronary artery of native heart without angina pectoris   4. PAF (paroxysmal atrial fibrillation) (HCC)    PLAN:    CAD s/p LAD PCI Mild bilateral CAS Paroxysmal Atrial Fibrillation- device transmission done: no AF 1 run of NSVT - DDX includes anxiety from his very stressful job, NSVT (one episode would not correlate with his sx) or  angina (he notes he has always had atypical symptoms) - will check K and Mg - will restart succinate 12.5 mg Po daily and check blood pressure; he is willing to trial this if symptoms do not improve - he is going on a trip to [place] tomorrow; we can send K or Mg as needed to [place] if needed - we have discussed how a PPM works and how device checks work - we have discussed etiologies of HB in a younger patient without tick exposure, SA trips, or obstructive RCA disease; he cannot get CMR at this time with fresh PPM; there is no COE for PET Sarcoidosis studies in [place]; may get CMR in the future - reviewed with primary cardiologist   Time Spent Directly with Patient:   I have spent a total of 45 minutes with the patient reviewing notes, imaging, EKGs, labs and examining the patient as well as establishing an assessment and plan that was discussed personally with the patient.  > 50% of time was spent in direct patient care.     Medication Adjustments/Labs and Tests Ordered: Current medicines  are reviewed at length with the patient today.  Concerns regarding medicines are outlined above.  No orders of the defined types were placed in this encounter.  No orders of the defined types were placed in this encounter.   There are no Patient Instructions on file for this visit.   Signed, Christell Constant, MD  10/20/2021 5:07 PM    [facility]

## 2021-10-20 NOTE — Telephone Encounter (Signed)
Pt c/o Shortness Of Breath: STAT if SOB developed within the last 24 hours or pt is noticeably SOB on the phone  1. Are you currently SOB (can you hear that pt is SOB on the phone)? No  2. How long have you been experiencing SOB?  Since Friday  3. Are you SOB when sitting or when up moving around? Both  4. Are you currently experiencing any other symptoms? Having a sensation around chest area where Pacemaker is

## 2021-10-21 ENCOUNTER — Encounter (HOSPITAL_COMMUNITY)
Admission: RE | Admit: 2021-10-21 | Discharge: 2021-10-21 | Disposition: A | Discharge status: Home / Self Care | Payer: BC Managed Care – PPO | Source: Ambulatory Visit | Attending: Cardiology | Admitting: Cardiology

## 2021-10-21 VITALS — Wt 187.4 lb

## 2021-10-21 DIAGNOSIS — I2510 Atherosclerotic heart disease of native coronary artery without angina pectoris: Secondary | ICD-10-CM | POA: Insufficient documentation

## 2021-10-21 DIAGNOSIS — I4729 Other ventricular tachycardia: Secondary | ICD-10-CM | POA: Insufficient documentation

## 2021-10-21 DIAGNOSIS — Z79899 Other long term (current) drug therapy: Secondary | ICD-10-CM | POA: Diagnosis not present

## 2021-10-21 DIAGNOSIS — I480 Paroxysmal atrial fibrillation: Secondary | ICD-10-CM | POA: Diagnosis present

## 2021-10-21 DIAGNOSIS — Z48812 Encounter for surgical aftercare following surgery on the circulatory system: Secondary | ICD-10-CM | POA: Insufficient documentation

## 2021-10-21 DIAGNOSIS — Z955 Presence of coronary angioplasty implant and graft: Secondary | ICD-10-CM | POA: Insufficient documentation

## 2021-10-21 DIAGNOSIS — Z7982 Long term (current) use of aspirin: Secondary | ICD-10-CM | POA: Insufficient documentation

## 2021-10-21 DIAGNOSIS — R55 Syncope and collapse: Secondary | ICD-10-CM | POA: Diagnosis not present

## 2021-10-21 DIAGNOSIS — E785 Hyperlipidemia, unspecified: Secondary | ICD-10-CM | POA: Insufficient documentation

## 2021-10-21 DIAGNOSIS — I472 Ventricular tachycardia, unspecified: Secondary | ICD-10-CM | POA: Diagnosis not present

## 2021-10-21 DIAGNOSIS — Z7902 Long term (current) use of antithrombotics/antiplatelets: Secondary | ICD-10-CM | POA: Insufficient documentation

## 2021-10-21 DIAGNOSIS — I493 Ventricular premature depolarization: Secondary | ICD-10-CM | POA: Insufficient documentation

## 2021-10-21 LAB — BASIC METABOLIC PANEL
BUN/Creatinine Ratio: 19 (ref 10–24)
BUN: 17 mg/dL (ref 8–27)
CO2: 22 mmol/L (ref 20–29)
Calcium: 9.6 mg/dL (ref 8.6–10.2)
Chloride: 102 mmol/L (ref 96–106)
Creatinine, Ser: 0.91 mg/dL (ref 0.76–1.27)
Glucose: 91 mg/dL (ref 70–99)
Potassium: 4.4 mmol/L (ref 3.5–5.2)
Sodium: 139 mmol/L (ref 134–144)
eGFR: 96 mL/min/{1.73_m2} (ref >59)

## 2021-10-21 LAB — MAGNESIUM: Magnesium: 2.1 mg/dL (ref 1.6–2.3)

## 2021-10-21 NOTE — Progress Notes (Signed)
Brandon Madden 61 y.o. male  Brandon Madden is motivated to make lifestyle changes to aid with cardiac/pulmonary rehab and weight loss. Patient has medical history of DES LAD 07/15/2021, PAF, CAD, carotid stenosis, OSA on CPAP, hyperlipidemia. 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 He lives at home with his wife and three of his four daughters. He reports getting 5-7 hours of sleep per night and does use his CPAP regularly. He has made many lifestyle changes including eliminated red meat, choosing fish 3-4x/week, eliminated sugar. He does eat out at least 2-3x/week but generally tries to choose fish. He has been working with [employer] loss and is following a low carb diet (<100g carbohydrates daily, <1200-1400kcals). He is down ~18# since 07/13/2021 (at 205#).  He is motivated to get back to running, biking long term and improving longevity.    RMR: 1602 (x1.3-1.5, 2083-2403kcals/day)  Labs: Lipid panel WNL, A1c WNL, vitamin D 26.2  Supplements: Centrum 50+,   24 hour diet recall:  Breakfast:[employer] egg whites, decaf Americano  Lunch: leftovers- grilled chicken, chimchiuri, yellow squash OR Cava Snack: unsalted nuts, carrots  Dinner: chicken, vegetable After dinner: plain nonfat greek yogurt +blueberries Beverages: water, unsweetened tea  Nutrition Diagnosis Obese  I = 30-34.9 related to excessive energy intake as evidenced by a BMI of 30.25  Nutrition Intervention Pt's individual nutrition plan reviewed with pt. Benefits of adopting Heart Healthy diet discussed.   Continue client-centered nutrition education by RD, as part of interdisciplinary care.  Monitor/Evaluation: Patient reports motivation to make lifestyle changes for adherence to heart healthy diet recommendation and weight management. We discussed calorie and carbohydrate intake, reviewed high fiber, high potassium foods including fruits and vegetables. Reviewed tracking intake,the plate method as a guide for meal  planning, and Mediterranean diet. Handouts/notes given. Patient amicable to RD suggestions and verbalizes understanding. Will follow-up as needed and continue to attend the Prit[employer]in education series.   62 minutes spent in review of topics related to a heart healthy diet including sodium intake, blood sugar control, weight management, label reading, hydration, and fiber intake. Start Time: 8:00 End Time: 9:02  Goal(s) Patient to limit saturated fat and trans fat and prioritize fish, poultry, nonfat dairy, and vegetarian protein sources 2.   Patient to increase fiber intake through whole grains, fruits, and vegetables. Aim for 2-4 servings of fruit daily, >5 servings vegetables, and up to 5 whole grain servings per day. May increase carbohydrate intake to at least 35-40% of calories (>140g carbohydrates).   4. Patient to identify high sodium food choices and reduce sodium intake to <2300 mg, aiming for 1500 mg daily. 5. Patient to identify strategies and food quantities to achieve weight/weight gain of 0.5-2.0# per week. May consider tracking intake with MyFitness Pal app, LoseIt!, etc.   Plan:  Will provide client-centered nutrition education as part of interdisciplinary care Monitor and evaluate progress toward nutrition goal with team. Patient given the option to attend P[employer] education, cooking workshops, and educational videos   Brandon Kurka [place], MS, RDN, LDN

## 2021-10-26 ENCOUNTER — Encounter (HOSPITAL_COMMUNITY)
Admission: RE | Admit: 2021-10-26 | Discharge: 2021-10-26 | Disposition: A | Discharge status: Home / Self Care | Payer: BC Managed Care – PPO | Source: Ambulatory Visit | Attending: Cardiology | Admitting: Cardiology

## 2021-10-26 DIAGNOSIS — Z955 Presence of coronary angioplasty implant and graft: Secondary | ICD-10-CM

## 2021-10-26 DIAGNOSIS — Z48812 Encounter for surgical aftercare following surgery on the circulatory system: Secondary | ICD-10-CM | POA: Diagnosis not present

## 2021-10-28 ENCOUNTER — Encounter (HOSPITAL_COMMUNITY)
Admission: RE | Admit: 2021-10-28 | Discharge: 2021-10-28 | Disposition: A | Discharge status: Home / Self Care | Payer: BC Managed Care – PPO | Source: Ambulatory Visit | Attending: Cardiology | Admitting: Cardiology

## 2021-10-28 DIAGNOSIS — Z48812 Encounter for surgical aftercare following surgery on the circulatory system: Secondary | ICD-10-CM | POA: Diagnosis not present

## 2021-10-28 DIAGNOSIS — Z955 Presence of coronary angioplasty implant and graft: Secondary | ICD-10-CM

## 2021-10-31 ENCOUNTER — Encounter (HOSPITAL_COMMUNITY)
Admission: RE | Admit: 2021-10-31 | Discharge: 2021-10-31 | Disposition: A | Discharge status: Home / Self Care | Payer: BC Managed Care – PPO | Source: Ambulatory Visit | Attending: Cardiology | Admitting: Cardiology

## 2021-10-31 DIAGNOSIS — Z955 Presence of coronary angioplasty implant and graft: Secondary | ICD-10-CM

## 2021-10-31 DIAGNOSIS — Z48812 Encounter for surgical aftercare following surgery on the circulatory system: Secondary | ICD-10-CM | POA: Diagnosis not present

## 2021-11-02 ENCOUNTER — Encounter (HOSPITAL_COMMUNITY)
Admission: RE | Admit: 2021-11-02 | Discharge: 2021-11-02 | Disposition: A | Discharge status: Home / Self Care | Payer: BC Managed Care – PPO | Source: Ambulatory Visit | Attending: Cardiology | Admitting: Cardiology

## 2021-11-02 DIAGNOSIS — Z48812 Encounter for surgical aftercare following surgery on the circulatory system: Secondary | ICD-10-CM | POA: Diagnosis not present

## 2021-11-02 DIAGNOSIS — Z955 Presence of coronary angioplasty implant and graft: Secondary | ICD-10-CM

## 2021-11-03 NOTE — Progress Notes (Deleted)
Cardiology Office Note:    Date:  11/03/2021   ID:  Brandon Madden, DOB 29-Jan-1961, MRN [id number]  PCP:  [place], Betty G, MD   [facility]  Cardiologist:  [name], MD  Advanced Practice Provider:  No care team member to display Electrophysiologist:  Will Jorja Loa, MD   Referring MD: [place], Betty G, MD    History of Present Illness:    Brandon Madden is a 61 y.o. male with a hx of congenital deafness, HLD and recent COVID infection who presents to clinic for follow-up.   Patient seen on 04/05/18 for non-exertional chest pain and palpitations. 2 week zio monitor without significant arrhythmias. Coronary calcium score 166 which was 80% for age and sex matched control.   Called clinic on 06/03/20 for episode of syncope. Specifically, the patient was sitting in a restaurant about to eat when he felt sudden onset of "feeling like he was going into a hole" with dizziness and profound fatigue. He then lost consciousness for 20-25seconds before returning back to baseline mental status. No preceding chest pain, palpitations, SOB. [place] and had completed an 11 mile bike ride the day before. Has had several similar episodes in the past but has not lost consciousness with them. Previously saw neuro where head imaging and EEG was negative. His TTE showed normal EF 55-60%, normal RV, no significant valve disease. CTA negative for PE/dissection. Symptoms sounded possibly vagal, however, given palpitations and syncope, we referred to EP.  Was seen by Dr. Elberta Fortis on 11/2020 and underwent linq implant which showed 22H episode of Afib. CHADs-vasc 1. No indication for [facility]. Was started on dilt 30mg  to start as needed.  Underwent CTA coronaries 06/2021 for atypical chest pain where he was found to have severe mid-LAD stenosis. Subsequent cath 07/15/21 confirmed 95% mid-LAD disease prompting PCI with excellent angiographic results.   Was seen back  in clinic after an episode of syncope where linq device revealed 10s pause prompting PPM placement with Dr. 07/17/21 on 08/31/21.  He was seen in follow-up by Dr. 11/01/21 on 10/20/21 for more frequent palpitations. Interrogation of device revealed no episodes of Afib. He was started on metop 12.5mg  daily at that time.  Today, ***   Past Medical History:  Diagnosis Date   CAD (coronary artery disease) 07/13/2021   CCTA 06/2021: CAC score 260 (84th percentile); mLAD 70-99; mLAD FFR pos (0.60) Status post DES to the mid LAD 06/2021   Carotid stenosis 08/11/2021   Carotid 08/13/2021 07/2021: Bilat ICA 1-39   Deafness in right ear    Family history of pancreatic cancer    Family history of prostate cancer    Family history of skin cancer    Hyperlipidemia LDL goal <70 08/20/2018   PAF (paroxysmal atrial fibrillation) (HCC) 05/06/2021   Sleep apnea     Past Surgical History:  Procedure Laterality Date   CARDIAC CATHETERIZATION     CORONARY STENT INTERVENTION N/A 07/15/2021   Procedure: CORONARY STENT INTERVENTION;  Surgeon: 07/17/2021, MD;  Location: [facility] INVASIVE CV LAB;  Service: Cardiovascular;  Laterality: N/A;   INSERT / REPLACE / REMOVE PACEMAKER     INTRAVASCULAR ULTRASOUND/IVUS N/A 07/15/2021   Procedure: Intravascular Ultrasound/IVUS;  Surgeon: 07/17/2021, MD;  Location: [facility] INVASIVE CV LAB;  Service: Cardiovascular;  Laterality: N/A;   LEFT HEART CATH AND CORONARY ANGIOGRAPHY N/A 07/15/2021   Procedure: LEFT HEART CATH AND CORONARY ANGIOGRAPHY;  Surgeon: 07/17/2021,  MD;  Location: [facility] INVASIVE CV LAB;  Service: Cardiovascular;  Laterality: N/A;   LOOP RECORDER REMOVAL N/A 09/01/2021   Procedure: LOOP RECORDER REMOVAL;  Surgeon: Regan Lemming, MD;  Location: [facility] INVASIVE CV LAB;  Service: Cardiovascular;  Laterality: N/A;   NASAL SEPTUM SURGERY     PACEMAKER IMPLANT N/A 09/01/2021   Procedure: PACEMAKER IMPLANT;  Surgeon: Regan Lemming, MD;  Location: [facility] INVASIVE CV  LAB;  Service: Cardiovascular;  Laterality: N/A;    Current Medications: No outpatient medications have been marked as taking for the 11/14/21 encounter (Appointment) with [name], MD.     Allergies:   Bee venom   Social History   Socioeconomic History   Marital status: Married    Spouse name: [name]   Number of children: 4   Years of education: Law   Highest education level: Professional school degree (e.g., MD, DDS, DVM, JD)  Occupational History   Occupation: [employer]    Comment: [employer]  Tobacco Use   Smoking status: Never   Smokeless tobacco: Never  Vaping Use   Vaping Use: Never used  Substance and Sexual Activity   Alcohol use: No    Comment: quit: 2011   Drug use: No   Sexual activity: Yes  Other Topics Concern   Not on file  Social History Narrative   Right handed   Patient lives at home with his family.   Caffeine Use: 2 cups daily   Social Determinants of Health   Financial Resource Strain: Not on file  Food Insecurity: Not on file  Transportation Needs: Not on file  Physical Activity: Not on file  Stress: Not on file  Social Connections: Not on file     Family History: The patient's family history includes Heart attack in his father; Pancreatic cancer in his maternal grandmother; Prostate cancer in his brother and brother; Skin cancer in his sister.  ROS:   Please see the history of present illness.    Review of Systems  Constitutional:  Negative for chills and fever.  HENT:  Negative for hearing loss and sore throat.   Eyes:  Negative for blurred vision and redness.  Respiratory:  Negative for shortness of breath.   Cardiovascular:  Positive for chest pain and palpitations. Negative for orthopnea, claudication, leg swelling and PND.  Gastrointestinal:  Negative for nausea and vomiting.  Genitourinary:  Negative for dysuria and flank pain.  Musculoskeletal:  Negative for falls.  Neurological:  Negative for dizziness and loss of  consciousness.  Endo/Heme/Allergies:  Negative for polydipsia.  Psychiatric/Behavioral:  Negative for substance abuse.     EKGs/Labs/Other Studies Reviewed:    The following studies were reviewed today: [facility] 08/01/2021: CONCLUSIONS: 95% mid LAD treated with IVUS guided stent implantation reducing stenosis to 0% with TIMI grade III flow using an 18 x 2.75 Onyx postdilated to 3.25 mm in diameter. The proximal to mid LAD contains 30% narrowing Left main is normal Circumflex is normal Right coronary is dominant and normal LV function is normal with LVEDP 15 mmHg.  EF 55%.   RECOMMENDATIONS:   Aspirin and Plavix x6 months.  After 6 months can de-escalate to monotherapy with either aspirin or Plavix. Aggressive risk factor modification. Outpatient status.  CTA Chest 06/04/2020: FINDINGS: Cardiovascular: Heart size normal. No pericardial effusion. Satisfactory opacification of pulmonary arteries noted, and there is no evidence of pulmonary emboli. Scattered coronary calcifications. Fair contrast opacification of the thoracic aorta with no evidence of dissection, aneurysm, or stenosis.  There is classic 3-vessel brachiocephalic arch anatomy without proximal stenosis. Visualized proximal abdominal aorta unremarkable.   Mediastinum/Nodes: No mass or adenopathy. Calcified left hilar lymph node.   Lungs/Pleura: No pleural effusion. No pneumothorax. 4 mm calcified granuloma, anterior left upper lobe. Lungs are otherwise clear.   Upper Abdomen: Scattered diverticula noted near the splenic flexure. No acute findings.   Musculoskeletal: No chest wall abnormality. No acute or significant osseous findings.   Review of the MIP images confirms the above findings.   IMPRESSION: 1. Negative for acute PE or thoracic aortic dissection. 2. Coronary calcifications. The severity of coronary artery disease and any potential stenosis cannot be assessed on this non-gated CT examination.  Echo  06/03/2020:  1. Left ventricular ejection fraction, by estimation, is 55 to 60%. The  left ventricle has normal function. The left ventricle has no regional  wall motion abnormalities. Left ventricular diastolic parameters are  consistent with Grade I diastolic  dysfunction (impaired relaxation).   2. Right ventricular systolic function is normal. The right ventricular  size is normal.   3. The mitral valve is normal in structure. Trivial mitral valve  regurgitation.   4. The aortic valve is normal in structure. There is mild thickening of  the aortic valve. Aortic valve regurgitation is trivial. No aortic  stenosis is present.   Comparison(s): No prior Echocardiogram.   Cardiac CT scan 04/30/20: FINDINGS: Non-cardiac: See separate report from [facility].   Ascending Aorta: Normal caliber. No calcifications.   Pericardium: Normal   Coronary arteries: Normal coronary origins.   IMPRESSION: Coronary calcium score of 166. This was 80th percentile for age and sex matched control.  Cardiac monitor 04/27/20: Patch wear time was 13 days and 9 hours Predominant rhythm was NSR with average HR 84 (ranged from 48-172bpm) Rare SVE, rare PVCs <1% No Afib, significant pauses or sustained arrhythmias Triggered events mainly correlated with PACs. Overall, normal cardiac monitor  Recent Labs: 08/31/2021: Hemoglobin 16.7; Platelets 179 09/29/2021: ALT 36 10/21/2021: BUN 17; Creatinine, Ser 0.91; Magnesium 2.1; Potassium 4.4; Sodium 139   Recent Lipid Panel    Component Value Date/Time   CHOL 142 09/29/2021 0901   TRIG 64 09/29/2021 0901   HDL 51 09/29/2021 0901   CHOLHDL 2.8 09/29/2021 0901   CHOLHDL 5 03/30/2020 1011   VLDL 24.2 03/30/2020 1011   LDLCALC 78 09/29/2021 0901     Physical Exam:    VS:  There were no vitals taken for this visit.    Wt Readings from Last 3 Encounters:  10/21/21 187 lb 6.3 oz (85 kg)  10/20/21 190 lb 6.4 oz (86.4 kg)  09/27/21 191 lb 12.8 oz  (87 kg)     GEN:  Well nourished, well developed in no acute distress HEENT: Normal NECK: No JVD; No carotid bruits CARDIAC: RRR, no murmurs, rubs, gallops RESPIRATORY:  Clear to auscultation without rales, wheezing or rhonchi  ABDOMEN: Soft, non-tender, non-distended MUSCULOSKELETAL:  No edema; No deformity  SKIN: Warm and dry NEUROLOGIC:  Alert and oriented x 3 PSYCHIATRIC:  Normal affect   ASSESSMENT:    No diagnosis found.   PLAN:    In order of problems listed above:  #CAD s/p mid-LAD PCI: Coronary CTA 06/2021 revealed mid-LAD stenosis with positive FFR. Subsequent cath 07/15/21 confirmed 95% mid-LAD stenosis now s/p PCI. Currently doing well without anginal symptoms.   #Paroxysmal Afib: Diagnosed on Linq recorder with 22 hour episode. CHADs-vasc 1 with no indication for [facility] at this time. Followed by Dr.  Camnitz -Continue dilt 30mg  as needed -Not on AC due to CHADs-vasc 1 -Discussed that he has a higher risk of forming clots and would recommend against testosterone replacement therapy  #Syncope with 10s sinus pause: Found to have 10s pause on Linq monitor that was implanted for work-up of syncope. He is now s/p PPM placement with Dr. .  -Follow-up with Dr. Elberta Fortis as scheduled   #HLD: -Continue crestor 40mg  daily -Continue zetia 10mg  daily -Lifestyle modifications as below  Exercise recommendations: Goal of exercising for at least 30 minutes a day, at least 5 times per week.  Please exercise to a moderate exertion.  This means that while exercising it is difficult to speak in full sentences, however you are not so short of breath that you feel you must stop, and not so comfortable that you can carry on a full conversation.  Exertion level should be approximately a 5/10, if 10 is the most exertion you can perform.  Diet recommendations: Recommend a heart healthy diet such as the Mediterranean diet.  This diet consists of plant based foods, healthy fats, lean  meats, olive oil.  It suggests limiting the intake of simple carbohydrates such as white breads, pastries, and pastas.  It also limits the amount of red meat, wine, and dairy products such as cheese that one should consume on a daily basis.   Follow-up:  6 months  Medication Adjustments/Labs and Tests Ordered: Current medicines are reviewed at length with the patient today.  Concerns regarding medicines are outlined above.   No orders of the defined types were placed in this encounter.  No orders of the defined types were placed in this encounter.  There are no Patient Instructions on file for this visit.   I,Mathew Stumpf,acting as a Elberta Fortis for , MD.,have documented all relevant documentation on the behalf of , MD,as directed by  Neurosurgeon, MD while in the presence of [name], MD.  I, [name], MD, have reviewed all documentation for this visit. The documentation on 11/03/21 for the exam, diagnosis, procedures, and orders are all accurate and complete.    Signed, [name], MD  11/03/2021 8:05 PM    [facility]

## 2021-11-04 ENCOUNTER — Encounter (HOSPITAL_COMMUNITY)
Admission: RE | Admit: 2021-11-04 | Discharge: 2021-11-04 | Disposition: A | Discharge status: Home / Self Care | Payer: BC Managed Care – PPO | Source: Ambulatory Visit | Attending: Cardiology | Admitting: Cardiology

## 2021-11-04 DIAGNOSIS — Z48812 Encounter for surgical aftercare following surgery on the circulatory system: Secondary | ICD-10-CM | POA: Diagnosis not present

## 2021-11-04 DIAGNOSIS — Z955 Presence of coronary angioplasty implant and graft: Secondary | ICD-10-CM

## 2021-11-07 ENCOUNTER — Encounter (HOSPITAL_COMMUNITY): Payer: BC Managed Care – PPO

## 2021-11-07 ENCOUNTER — Encounter (HOSPITAL_COMMUNITY)
Admission: RE | Admit: 2021-11-07 | Discharge: 2021-11-07 | Disposition: A | Discharge status: Home / Self Care | Payer: BC Managed Care – PPO | Source: Ambulatory Visit | Attending: Cardiology | Admitting: Cardiology

## 2021-11-07 DIAGNOSIS — Z48812 Encounter for surgical aftercare following surgery on the circulatory system: Secondary | ICD-10-CM | POA: Diagnosis not present

## 2021-11-07 DIAGNOSIS — Z955 Presence of coronary angioplasty implant and graft: Secondary | ICD-10-CM

## 2021-11-09 ENCOUNTER — Encounter (HOSPITAL_COMMUNITY)
Admission: RE | Admit: 2021-11-09 | Discharge: 2021-11-09 | Disposition: A | Discharge status: Home / Self Care | Payer: BC Managed Care – PPO | Source: Ambulatory Visit | Attending: Cardiology | Admitting: Cardiology

## 2021-11-09 DIAGNOSIS — Z48812 Encounter for surgical aftercare following surgery on the circulatory system: Secondary | ICD-10-CM | POA: Diagnosis not present

## 2021-11-09 DIAGNOSIS — Z955 Presence of coronary angioplasty implant and graft: Secondary | ICD-10-CM

## 2021-11-11 ENCOUNTER — Encounter (HOSPITAL_COMMUNITY)
Admission: RE | Admit: 2021-11-11 | Discharge: 2021-11-11 | Disposition: A | Discharge status: Home / Self Care | Payer: BC Managed Care – PPO | Source: Ambulatory Visit | Attending: Cardiology | Admitting: Cardiology

## 2021-11-11 DIAGNOSIS — Z48812 Encounter for surgical aftercare following surgery on the circulatory system: Secondary | ICD-10-CM | POA: Diagnosis not present

## 2021-11-11 DIAGNOSIS — Z955 Presence of coronary angioplasty implant and graft: Secondary | ICD-10-CM

## 2021-11-14 ENCOUNTER — Encounter (INDEPENDENT_AMBULATORY_CARE_PROVIDER_SITE_OTHER): Payer: BC Managed Care – PPO | Admitting: Cardiology

## 2021-11-14 ENCOUNTER — Encounter: Payer: Self-pay | Admitting: Cardiology

## 2021-11-14 VITALS — BP 108/76 | HR 77 | Ht 66.0 in | Wt 187.8 lb

## 2021-11-14 DIAGNOSIS — R55 Syncope and collapse: Secondary | ICD-10-CM

## 2021-11-14 DIAGNOSIS — I480 Paroxysmal atrial fibrillation: Secondary | ICD-10-CM | POA: Diagnosis not present

## 2021-11-14 DIAGNOSIS — I2510 Atherosclerotic heart disease of native coronary artery without angina pectoris: Secondary | ICD-10-CM

## 2021-11-14 DIAGNOSIS — I4729 Other ventricular tachycardia: Secondary | ICD-10-CM

## 2021-11-14 DIAGNOSIS — E785 Hyperlipidemia, unspecified: Secondary | ICD-10-CM

## 2021-11-14 MED ORDER — CLOPIDOGREL BISULFATE 75 MG PO TABS: 75.0000 mg | ORAL_TABLET | Freq: Every day | ORAL | 3 refills | Status: DC

## 2021-11-14 NOTE — Patient Instructions (Signed)
Medication Instructions:   Your physician recommends that you continue on your current medications as directed. Please refer to the Current Medication list given to you today.  *If you need a refill on your cardiac medications before your next appointment, please call your pharmacy*   Follow-Up:  3 MONTHS WITH AN EXTENDER IN THE OFFICE    Important Information About Sugar

## 2021-11-14 NOTE — Progress Notes (Unsigned)
ACUTE VISIT No chief complaint on file.  HPI: [name] is a 61 y.o. male, who is here today complaining of *** HPI  Review of Systems Rest see pertinent positives and negatives per HPI.  Current Outpatient Medications on File Prior to Visit  Medication Sig Dispense Refill  . acetaminophen (TYLENOL) 500 MG tablet Take 500-1,000 mg by mouth every 6 (six) hours as needed (pain.).    Marland Kitchen aspirin EC 81 MG tablet Take 1 tablet (81 mg total) by mouth daily. 90 tablet 3  . aspirin-acetaminophen-caffeine (EXCEDRIN MIGRAINE) 250-250-65 MG tablet Take 1-2 tablets by mouth 2 (two) times daily as needed for headache.    . cephALEXin (KEFLEX) 500 MG capsule Take 500 mg by mouth 4 (four) times daily.    . clopidogrel (PLAVIX) 75 MG tablet Take 1 tablet (75 mg total) by mouth daily. 90 tablet 3  . diltiazem (CARDIZEM) 30 MG tablet Take 1 tablet (30 mg total) by mouth 4 (four) times daily as needed. 30 tablet 3  . ezetimibe (ZETIA) 10 MG tablet Take 1 tablet (10 mg total) by mouth daily. 90 tablet 3  . ibuprofen (ADVIL) 200 MG tablet Take 400-600 mg by mouth every 8 (eight) hours as needed (pain.).    Marland Kitchen Multiple Vitamins-Minerals (MULTI FOR HIM) TABS Take 1 tablet by mouth in the morning.    . nitroGLYCERIN (NITROSTAT) 0.4 MG SL tablet Place 1 tablet (0.4 mg total) under the tongue every 5 (five) minutes as needed for chest pain. 90 tablet 3  . Polyethyl Glycol-Propyl Glycol (LUBRICANT EYE DROPS) 0.4-0.3 % SOLN Place 1-2 drops into both eyes 3 (three) times daily as needed (dry/irritated eyes.).    Marland Kitchen rosuvastatin (CRESTOR) 20 MG tablet Take 2 tablets (40 mg total) by mouth daily. 180 tablet 3   No current facility-administered medications on file prior to visit.     Past Medical History:  Diagnosis Date  . CAD (coronary artery disease) 07/13/2021   CCTA 06/2021: CAC score 260 (84th percentile); mLAD 70-99; mLAD FFR pos (0.60) Status post DES to the mid LAD 06/2021  . Carotid stenosis  08/11/2021   Carotid US 07/2021: Bilat ICA 1-39  . Deafness in right ear   . Family history of pancreatic cancer   . Family history of prostate cancer   . Family history of skin cancer   . Hyperlipidemia LDL goal <70 08/20/2018  . PAF (paroxysmal atrial fibrillation) ([place]) 05/06/2021  . Sleep apnea    Allergies  Allergen Reactions  . Bee Venom Anaphylaxis    Social History   Socioeconomic History  . Marital status: Married    Spouse name: Verdis Frederickson  . Number of children: 4  . Years of education: Law  . Highest education level: Professional school degree (e.g., MD, DDS, DVM, JD)  Occupational History  . Occupation: [employer]    Comment: [employer]  Tobacco Use  . Smoking status: Never  . Smokeless tobacco: Never  Vaping Use  . Vaping Use: Never used  Substance and Sexual Activity  . Alcohol use: No    Comment: quit: 2011  . Drug use: No  . Sexual activity: Yes  Other Topics Concern  . Not on file  Social History Narrative   Right handed   Patient lives at home with his family.   Caffeine Use: 2 cups daily   Social Determinants of Health   Financial Resource Strain: Not on file  Food Insecurity: Not on file  Transportation Needs: Not on  file  Physical Activity: Not on file  Stress: Not on file  Social Connections: Not on file    There were no vitals filed for this visit. There is no height or weight on file to calculate BMI.  Physical Exam  ASSESSMENT AND PLAN:  There are no diagnoses linked to this encounter.   No follow-ups on file.   Soleil Mas G. [place], MD  [facility]. [place] office.  Discharge Instructions   None

## 2021-11-14 NOTE — Progress Notes (Signed)
Cardiology Office Note:    Date:  11/14/2021   ID:  Brandon Madden, DOB 04-11-1960, MRN [id number]  PCP:  [place], Betty G, MD   [facility]  Cardiologist:  [name], MD  Advanced Practice Provider:  No care team member to display Electrophysiologist:  Will Jorja Loa, MD   Referring MD: [place], Betty G, MD    History of Present Illness:    Brandon Madden is a 61 y.o. male with a hx of congenital deafness, HLD and recent COVID infection who presents to clinic for follow-up.   Patient seen on 04/05/18 for non-exertional chest pain and palpitations. 2 week zio monitor without significant arrhythmias. Coronary calcium score 166 which was 80% for age and sex matched control.   Called clinic on 06/03/20 for episode of syncope. Specifically, the patient was sitting in a restaurant about to eat when he felt sudden onset of "feeling like he was going into a hole" with dizziness and profound fatigue. He then lost consciousness for 20-25seconds before returning back to baseline mental status. No preceding chest pain, palpitations, SOB. [place] and had completed an 11 mile bike ride the day before. Has had several similar episodes in the past but has not lost consciousness with them. Previously saw neuro where head imaging and EEG was negative. His TTE showed normal EF 55-60%, normal RV, no significant valve disease. CTA negative for PE/dissection. Symptoms sounded possibly vagal, however, given palpitations and syncope, we referred to EP.  Was seen by Dr. Elberta Fortis on 11/2020 and underwent linq implant which showed 22H episode of Afib. CHADs-vasc 1. No indication for [facility]. Was started on dilt 30mg  to start as needed.  Underwent CTA coronaries 06/2021 for atypical chest pain where he was found to have severe mid-LAD stenosis. Subsequent cath 07/15/21 confirmed 95% mid-LAD disease prompting PCI with excellent angiographic results.   Was seen back  in clinic after an episode of syncope where linq device revealed 10s pause prompting PPM placement with Dr. 07/17/21 on 08/31/21.  He was seen in follow-up by Dr. 11/01/21 on 10/20/21 for more frequent palpitations. Interrogation of device revealed no episodes of Afib. He was started on metop 12.5mg  daily at that time.  Today, the patient has several issues he would like to address. First, he states that he is concerned about an enlarged lymph node in his right neck associated with soreness. He has been taking an antibiotic but has not yet seen much improvement. No fevers, chills, or congestion. Plans to follow-up with his PCP for further evaluation.  He confirms having more frequent palpitations at his visit with Dr. 10/22/21. Currently his palpitations are less frequent than at that visit. However he has noticed that the palpitations are more common in the mornings. He can feel his heart rate is faster in the mornings, and his Fit Bit may show 90-100 bpm. This improves after he eats breakfast and has some fluid. Discussed that this may be related to mild dehydration that may occur overnight.  Today he presents a BP log from cardiac rehab. On personal review this shows average readings of 90s-120s/60s-70s. He feels well with exertion with no chest pain, significant dyspnea or orthostatic symptoms.  Fortunately, he has had no further episodes of syncope since his PPM was placed. We spent a lot of time talking about the role of his pacemaker and the fact that vagal episodes can still occur, however, his heart will not pause like it had  in the past due to the presence of the pacemaker. He is also concerned that he has mild residual heart artery disease and wants to ensure this is appropriately monitored in the future to prevent progression. Reassured him that this disease is mild and he is on appropriate therapy to prevent disease progression.  He denies any chest pain, or peripheral edema. No  headaches, orthopnea, or PND.   Past Medical History:  Diagnosis Date   CAD (coronary artery disease) 07/13/2021   CCTA 06/2021: CAC score 260 (84th percentile); mLAD 70-99; mLAD FFR pos (0.60) Status post DES to the mid LAD 06/2021   Carotid stenosis 08/11/2021   Carotid US 07/2021: Bilat ICA 1-39   Deafness in right ear    Family history of pancreatic cancer    Family history of prostate cancer    Family history of skin cancer    Hyperlipidemia LDL goal <70 08/20/2018   PAF (paroxysmal atrial fibrillation) ([place]) 05/06/2021   Sleep apnea     Past Surgical History:  Procedure Laterality Date   CARDIAC CATHETERIZATION     CORONARY STENT INTERVENTION N/A 07/15/2021   Procedure: CORONARY STENT INTERVENTION;  Surgeon: Belva Crome, MD;  Location: [place] [facility];  Service: Cardiovascular;  Laterality: N/A;   INSERT / REPLACE / REMOVE PACEMAKER     INTRAVASCULAR ULTRASOUND/IVUS N/A 07/15/2021   Procedure: Intravascular Ultrasound/IVUS;  Surgeon: Belva Crome, MD;  Location: [place] [facility];  Service: Cardiovascular;  Laterality: N/A;   LEFT HEART CATH AND CORONARY ANGIOGRAPHY N/A 07/15/2021   Procedure: LEFT HEART CATH AND CORONARY ANGIOGRAPHY;  Surgeon: Belva Crome, MD;  Location: [place] [facility];  Service: Cardiovascular;  Laterality: N/A;   LOOP RECORDER REMOVAL N/A 09/01/2021   Procedure: LOOP RECORDER REMOVAL;  Surgeon: Constance Haw, MD;  Location: [place] [facility];  Service: Cardiovascular;  Laterality: N/A;   NASAL SEPTUM SURGERY     PACEMAKER IMPLANT N/A 09/01/2021   Procedure: PACEMAKER IMPLANT;  Surgeon: Constance Haw, MD;  Location: [place] [facility];  Service: Cardiovascular;  Laterality: N/A;    Current Medications: Current Meds  Medication Sig   acetaminophen (TYLENOL) 500 MG tablet Take 500-1,000 mg by mouth every 6 (six) hours as needed (pain.).   aspirin EC 81 MG tablet Take 1 tablet (81 mg total) by mouth daily.    aspirin-acetaminophen-caffeine (EXCEDRIN MIGRAINE) 250-250-65 MG tablet Take 1-2 tablets by mouth 2 (two) times daily as needed for headache.   cephALEXin (KEFLEX) 500 MG capsule Take 500 mg by mouth 4 (four) times daily.   diltiazem (CARDIZEM) 30 MG tablet Take 1 tablet (30 mg total) by mouth 4 (four) times daily as needed.   ezetimibe (ZETIA) 10 MG tablet Take 1 tablet (10 mg total) by mouth daily.   ibuprofen (ADVIL) 200 MG tablet Take 400-600 mg by mouth every 8 (eight) hours as needed (pain.).   Multiple Vitamins-Minerals (MULTI FOR HIM) TABS Take 1 tablet by mouth in the morning.   Polyethyl Glycol-Propyl Glycol (LUBRICANT EYE DROPS) 0.4-0.3 % SOLN Place 1-2 drops into both eyes 3 (three) times daily as needed (dry/irritated eyes.).   rosuvastatin (CRESTOR) 20 MG tablet Take 2 tablets (40 mg total) by mouth daily.   [DISCONTINUED] clopidogrel (PLAVIX) 75 MG tablet Take 1 tablet (75 mg total) by mouth daily.     Allergies:   Bee venom   Social History   Socioeconomic History   Marital status: Married    Spouse name:  [name]   Number of children: 4   Years of education: Law   Highest education level: Professional school degree (e.g., MD, DDS, DVM, JD)  Occupational History   Occupation: [employer]    Comment: [employer]  Tobacco Use   Smoking status: Never   Smokeless tobacco: Never  Vaping Use   Vaping Use: Never used  Substance and Sexual Activity   Alcohol use: No    Comment: quit: 2011   Drug use: No   Sexual activity: Yes  Other Topics Concern   Not on file  Social History Narrative   Right handed   Patient lives at home with his family.   Caffeine Use: 2 cups daily   Social Determinants of Health   Financial Resource Strain: Not on file  Food Insecurity: Not on file  Transportation Needs: Not on file  Physical Activity: Not on file  Stress: Not on file  Social Connections: Not on file     Family History: The patient's family history includes Heart attack in his  father; Pancreatic cancer in his maternal grandmother; Prostate cancer in his brother and brother; Skin cancer in his sister.  ROS:   Please see the history of present illness.    Review of Systems  Constitutional:  Negative for chills and fever.  HENT:  Negative for hearing loss and sore throat.   Eyes:  Negative for blurred vision and redness.  Respiratory:  Positive for shortness of breath.   Cardiovascular:  Positive for palpitations. Negative for chest pain, orthopnea, claudication, leg swelling and PND.  Gastrointestinal:  Negative for nausea and vomiting.  Genitourinary:  Negative for dysuria and flank pain.  Musculoskeletal:  Positive for neck pain (Right neck soreness). Negative for falls.  Neurological:  Negative for dizziness.  Endo/Heme/Allergies:  Negative for polydipsia.  Psychiatric/Behavioral:  Negative for substance abuse.     EKGs/Labs/Other Studies Reviewed:    The following studies were reviewed today:  Pacemaker Implant, ILR Removal  09/01/2021: CONCLUSIONS:   1. Successful implantation of a Medtronic Azure XT DR MRI SureScan dual-chamber pacemaker for symptomatic bradycardia  2. No early apparent complications.   Bilateral Carotid Doppler  08/10/2021: Summary:  Right Carotid: Velocities in the right ICA are consistent with a 1-39%  stenosis.   Left Carotid: Velocities in the left ICA are consistent with a 1-39%  stenosis.                Non-hemodynamically significant plaque <50% noted in the  CCA.   Vertebrals:  Bilateral vertebral arteries demonstrate antegrade flow.  Subclavians: Normal flow hemodynamics were seen in bilateral subclavian arteries.   LHC 07/15/21: CONCLUSIONS: 95% mid LAD treated with IVUS guided stent implantation reducing stenosis to 0% with TIMI grade III flow using an 18 x 2.75 Onyx postdilated to 3.25 mm in diameter. The proximal to mid LAD contains 30% narrowing Left main is normal Circumflex is normal Right coronary is  dominant and normal LV function is normal with LVEDP 15 mmHg.  EF 55%.   RECOMMENDATIONS:   Aspirin and Plavix x6 months.  After 6 months can de-escalate to monotherapy with either aspirin or Plavix. Aggressive risk factor modification. Outpatient status.  CTA Chest 06/04/2020: FINDINGS: Cardiovascular: Heart size normal. No pericardial effusion. Satisfactory opacification of pulmonary arteries noted, and there is no evidence of pulmonary emboli. Scattered coronary calcifications. Fair contrast opacification of the thoracic aorta with no evidence of dissection, aneurysm, or stenosis. There is classic 3-vessel brachiocephalic arch anatomy without proximal stenosis.  Visualized proximal abdominal aorta unremarkable.   Mediastinum/Nodes: No mass or adenopathy. Calcified left hilar lymph node.   Lungs/Pleura: No pleural effusion. No pneumothorax. 4 mm calcified granuloma, anterior left upper lobe. Lungs are otherwise clear.   Upper Abdomen: Scattered diverticula noted near the splenic flexure. No acute findings.   Musculoskeletal: No chest wall abnormality. No acute or significant osseous findings.   Review of the MIP images confirms the above findings.   IMPRESSION: 1. Negative for acute PE or thoracic aortic dissection. 2. Coronary calcifications. The severity of coronary artery disease and any potential stenosis cannot be assessed on this non-gated CT examination.  Echo 06/03/2020:  1. Left ventricular ejection fraction, by estimation, is 55 to 60%. The  left ventricle has normal function. The left ventricle has no regional  wall motion abnormalities. Left ventricular diastolic parameters are  consistent with Grade I diastolic  dysfunction (impaired relaxation).   2. Right ventricular systolic function is normal. The right ventricular  size is normal.   3. The mitral valve is normal in structure. Trivial mitral valve  regurgitation.   4. The aortic valve is normal in  structure. There is mild thickening of  the aortic valve. Aortic valve regurgitation is trivial. No aortic  stenosis is present.   Comparison(s): No prior Echocardiogram.   Cardiac CT scan 04/30/20: FINDINGS: Non-cardiac: [facility].   Ascending Aorta: Normal caliber. No calcifications.   Pericardium: Normal   Coronary arteries: Normal coronary origins.   IMPRESSION: Coronary calcium score of 166. This was 80th percentile for age and sex matched control.  Cardiac monitor 04/27/20: Patch wear time was 13 days and 9 hours Predominant rhythm was NSR with average HR 84 (ranged from 48-172bpm) Rare SVE, rare PVCs <1% No Afib, significant pauses or sustained arrhythmias Triggered events mainly correlated with PACs. Overall, normal cardiac monitor  EKG:  EKG is personally reviewed. 11/14/2021:  Sinus rhythm. Rate 77 bpm.    Recent Labs: 08/31/2021: Hemoglobin 16.7; Platelets 179 09/29/2021: ALT 36 10/21/2021: BUN 17; Creatinine, Ser 0.91; Magnesium 2.1; Potassium 4.4; Sodium 139   Recent Lipid Panel    Component Value Date/Time   CHOL 142 09/29/2021 0901   TRIG 64 09/29/2021 0901   HDL 51 09/29/2021 0901   CHOLHDL 2.8 09/29/2021 0901   CHOLHDL 5 03/30/2020 1011   VLDL 24.2 03/30/2020 1011   LDLCALC 78 09/29/2021 0901     Physical Exam:    VS:  BP 108/76   Pulse 77   Ht 5\' 6"  (1.676 m)   Wt 187 lb 12.8 oz (85.2 kg)   SpO2 93%   BMI 30.31 kg/m     Wt Readings from Last 3 Encounters:  11/14/21 187 lb 12.8 oz (85.2 kg)  10/21/21 187 lb 6.3 oz (85 kg)  10/20/21 190 lb 6.4 oz (86.4 kg)     GEN:  Well nourished, well developed in no acute distress HEENT: Normal NECK: No JVD; No carotid bruits.  CARDIAC: RRR, no murmurs, rubs, gallops RESPIRATORY:  Clear to auscultation without rales, wheezing or rhonchi  ABDOMEN: Soft, non-tender, non-distended MUSCULOSKELETAL:  No edema; No deformity  SKIN: Warm and dry NEUROLOGIC:  Alert and  oriented x 3 PSYCHIATRIC:  Anxious  ASSESSMENT:    1. Coronary artery disease involving native coronary artery of native heart without angina pectoris   2. PAF (paroxysmal atrial fibrillation) ([facility])   3. Hyperlipidemia LDL goal <70   4. Syncope and collapse   5. NSVT (nonsustained ventricular  tachycardia) ([facility])     PLAN:    In order of problems listed above:  #CAD s/p mid-LAD PCI: Coronary CTA 06/2021 revealed mid-LAD stenosis with positive FFR. Subsequent cath 07/15/21 confirmed 95% mid-LAD stenosis now s/p PCI. Currently doing well without anginal symptoms. Discussed that his 30% residual disease may not progress, but we will watch carefully for symptoms and pursue stress testing in the future if needed. -Continue ASA 81mg  daily -Continue plavix 75mg  daily -Continue crestor 40mg  daily -Continue zetia 10mg  daily  #Paroxysmal Afib: Diagnosed on Linq recorder with 22 hour episode. CHADs-vasc 1 with no indication for [facility] at this time. Followed by Dr. -Continue dilt 30mg  as needed -Not on [facility] due to CHADs-vasc 1 -Discussed that he has a higher risk of forming clots and would recommend against testosterone replacement therapy  #Syncope with 10s sinus pause: Found to have 10s pause on Linq monitor that was implanted for work-up of syncope. He is now s/p PPM placement with Dr. .  -Follow-up with [facility]. [facility] as scheduled   #HLD: -Continue crestor 40mg  daily -Continue zetia 10mg  daily -Will follow-up repeat lipids with goal LDL<70 -Lifestyle modifications as below  Exercise recommendations: Goal of exercising for at least 30 minutes a day, at least 5 times per week.  Please exercise to a moderate exertion.  This means that while exercising it is difficult to speak in full sentences, however you are not so short of breath that you feel you must stop, and not so comfortable that you can carry on a full conversation.  Exertion level should be approximately a 5/10, if 10 is  the most exertion you can perform.  Diet recommendations: Recommend a heart healthy diet such as the Mediterranean diet.  This diet consists of plant based foods, healthy fats, lean meats, olive oil.  It suggests limiting the intake of simple carbohydrates such as white breads, pastries, and pastas.  It also limits the amount of red meat, wine, and dairy products such as cheese that one should consume on a daily basis.   Follow-up:  3 months  Medication Adjustments/Labs and Tests Ordered: Current medicines are reviewed at length with the patient today.  Concerns regarding medicines are outlined above.   Orders Placed This Encounter  Procedures   EKG 12-Lead   Meds ordered this encounter  Medications   clopidogrel (PLAVIX) 75 MG tablet    Sig: Take 1 tablet (75 mg total) by mouth daily.    Dispense:  90 tablet    Refill:  3   Patient Instructions  Medication Instructions:   Your physician recommends that you continue on your current medications as directed. Please refer to the Current Medication list given to you today.  *If you need a refill on your cardiac medications before your next appointment, please call your pharmacy*   Follow-Up:  3 MONTHS WITH AN EXTENDER IN THE OFFICE    Important Information About Sugar        I,Mathew Stumpf,acting as a scribe for Elberta Fortis, MD.,have documented all relevant documentation on the behalf of , MD,as directed by  [facility], MD while in the presence of Elberta Fortis, MD.  I, Elberta Fortis, MD, have reviewed all documentation for this visit. The documentation on 11/14/21 for the exam, diagnosis, procedures, and orders are all accurate and complete.    Signed, , MD  11/14/2021 1:13 PM    [facility]

## 2021-11-15 ENCOUNTER — Encounter: Payer: Self-pay | Admitting: Family Medicine

## 2021-11-15 ENCOUNTER — Ambulatory Visit (INDEPENDENT_AMBULATORY_CARE_PROVIDER_SITE_OTHER): Payer: BC Managed Care – PPO | Admitting: Family Medicine

## 2021-11-15 VITALS — BP 120/70 | HR 78 | Resp 12 | Ht 66.0 in | Wt 187.0 lb

## 2021-11-15 DIAGNOSIS — Z23 Encounter for immunization: Secondary | ICD-10-CM

## 2021-11-15 DIAGNOSIS — R221 Localized swelling, mass and lump, neck: Secondary | ICD-10-CM

## 2021-11-15 DIAGNOSIS — I889 Nonspecific lymphadenitis, unspecified: Secondary | ICD-10-CM

## 2021-11-15 LAB — CBC WITH DIFFERENTIAL/PLATELET
Basophils Absolute: 0 10*3/uL (ref 0.0–0.1)
Basophils Relative: 0.7 % (ref 0.0–3.0)
Eosinophils Absolute: 0.1 10*3/uL (ref 0.0–0.7)
Eosinophils Relative: 2.8 % (ref 0.0–5.0)
HCT: 45.1 % (ref 39.0–52.0)
Hemoglobin: 15.9 g/dL (ref 13.0–17.0)
Lymphocytes Relative: 28.2 % (ref 12.0–46.0)
Lymphs Abs: 1.5 10*3/uL (ref 0.7–4.0)
MCHC: 35.2 g/dL (ref 30.0–36.0)
MCV: 92.1 fl (ref 78.0–100.0)
Monocytes Absolute: 0.4 10*3/uL (ref 0.1–1.0)
Monocytes Relative: 8 % (ref 3.0–12.0)
Neutro Abs: 3.1 10*3/uL (ref 1.4–7.7)
Neutrophils Relative %: 60.3 % (ref 43.0–77.0)
Platelets: 168 10*3/uL (ref 150.0–400.0)
RBC: 4.89 Mil/uL (ref 4.22–5.81)
RDW: 12.5 % (ref 11.5–15.5)
WBC: 5.1 10*3/uL (ref 4.0–10.5)

## 2021-11-15 NOTE — Patient Instructions (Signed)
A few things to remember from today's visit:   Neck mass - Plan: CT Soft Tissue Neck W Contrast, CBC with Differential/Platelet  Cervical lymphadenitis  Complete antibiotic treatment. Neck CT will be arranged. Monitor for new symptoms.  Lymphangitis, Adult  Lymphangitis is inflammation of one or more lymph vessels. This condition is usually caused by an infection with bacteria. The lymphatic system is part of the body's defense system (immune system). It is a network of vessels, glands, and organs that carry fluid (lymph) and other substances around the body. Lymph vessels drain into glands called lymph nodes. These nodes remove bacteria, viruses, and waste products from lymph to keep them from spreading through the body. Lymphangitis causes red streaks, swelling, and skin soreness in the area of the affected lymph vessels. Starting treatment right away is important because this condition can quickly get worse and lead to serious illness. It can spread quickly through your lymph system and into your blood (bacteremia). What are the causes? This condition is usually caused by a bacterial infection of the skin. The bacteria may enter the body through an injury to the skin, such as a cut, scratch, surgical incision, or insect bite. Lymphangitis usually results from an infection with streptococcus or staphylococcus bacteria, but it may also be caused by other infections. What increases the risk? The following factors may make you more likely to develop this condition: Being male. Men are more likely to get lymphangitis caused by cellulitis. Having a decreased ability to fight infection or a weakened immune system. Having diabetes. Taking drugs that suppress the immune system. Having chickenpox. Being weak from another illness. What are the signs or symptoms? The most common symptom of lymphangitis is a wound or skin infection that develops red streaks in the skin. These are the infected lymph  vessels. The red streaks will extend toward the lymph nodes that drain the vessels.  Other symptoms may include: Warmth and tenderness over the streaks. Throbbing pain. Swollen and tender lymph nodes. For arm infections, these will be under the arm. For leg infections, these will be in the groin area. Fever. Chills. Headache. Appetite loss. Muscle aches. Fast pulse. How is this diagnosed? This condition may be diagnosed based on your symptoms and a physical exam. You may also have tests, such as: Blood tests to check for an increase in white blood cells. Blood cultures to look for bacteremia. Culture and sensitivity testing. This is a test to find out what type of bacteria will grow from a sample of pus swabbed from the wound or skin infection. The results help determine which antibiotic medicines will kill the bacteria. X-rays. These may be needed if you have a red or swollen joint. In this case, you may also be referred to a bone specialist. How is this treated? Treatment for this condition may include: Antibiotics. You may be started on an antibiotic that is known to kill both streptococcus and staphylococcus bacteria. Your antibiotics may need to be switched if tests show that your condition is caused by another type of bacteria. If your infection is very bad or has spread to another area of your body, you may need to get antibiotics given directly into a vein through an IV at the hospital. Pain medicine. Incision and drainage. This is a procedure that may be done at the hospital if pus needs to be drained from any wound. Follow these instructions at home: Take over-the-counter and prescription medicines only as told by your health care provider.  Take your antibiotic medicine as told by your health care provider. Do not stop taking the antibiotic even if you start to feel better. Rest at home until your health care provider says that you can return to your normal activities. Drink  enough fluid to keep your urine pale yellow. Follow instructions from your health care provider about how to take care of any wound. Raise (elevate) the affected area above the level of your heart while you are sitting or lying down. Keep all follow-up visits as told by your health care provider. This is important. Contact a health care provider if: You have chills or a fever. Your symptoms do not go away or they get worse with treatment. Your symptoms come back after treatment. Get help right away if you have: A headache or a stiff neck. Chest pain. Trouble breathing. Summary Lymphangitis is inflammation of one or more lymph vessels. It is usually caused by a bacterial infection. Take your antibiotic medicine as told by your health care provider. Do not stop taking the antibiotic even if you start to feel better. Rest and drink plenty of fluids. Keep all follow-up visits as told by your health care provider. This is important. This information is not intended to replace advice given to you by your health care provider. Make sure you discuss any questions you have with your health care provider. Document Revised: 06/26/2020 Document Reviewed: 06/26/2020 Elsevier Patient Education  [place]. u need refills for medications you take chronically, please call your pharmacy. Do not use My Chart to request refills or for acute issues that need immediate attention. If you send a my chart message, it may take a few days to be addressed, specially if I am not in the office.  Please be sure medication list is accurate. If a new problem present, please set up appointment sooner than planned today.

## 2021-11-16 ENCOUNTER — Encounter (HOSPITAL_COMMUNITY)
Admission: RE | Admit: 2021-11-16 | Discharge: 2021-11-16 | Disposition: A | Discharge status: Home / Self Care | Payer: BC Managed Care – PPO | Source: Ambulatory Visit | Attending: Cardiology | Admitting: Cardiology

## 2021-11-16 DIAGNOSIS — Z48812 Encounter for surgical aftercare following surgery on the circulatory system: Secondary | ICD-10-CM | POA: Diagnosis not present

## 2021-11-16 DIAGNOSIS — Z955 Presence of coronary angioplasty implant and graft: Secondary | ICD-10-CM

## 2021-11-18 ENCOUNTER — Encounter (HOSPITAL_COMMUNITY)
Admission: RE | Admit: 2021-11-18 | Discharge: 2021-11-18 | Disposition: A | Discharge status: Home / Self Care | Payer: BC Managed Care – PPO | Source: Ambulatory Visit | Attending: Cardiology | Admitting: Cardiology

## 2021-11-18 DIAGNOSIS — Z955 Presence of coronary angioplasty implant and graft: Secondary | ICD-10-CM

## 2021-11-18 DIAGNOSIS — Z48812 Encounter for surgical aftercare following surgery on the circulatory system: Secondary | ICD-10-CM | POA: Diagnosis not present

## 2021-11-19 ENCOUNTER — Ambulatory Visit (HOSPITAL_BASED_OUTPATIENT_CLINIC_OR_DEPARTMENT_OTHER)
Admission: RE | Admit: 2021-11-19 | Discharge: 2021-11-19 | Disposition: A | Discharge status: Home / Self Care | Payer: BC Managed Care – PPO | Source: Ambulatory Visit | Attending: Family Medicine | Admitting: Family Medicine

## 2021-11-19 DIAGNOSIS — R221 Localized swelling, mass and lump, neck: Secondary | ICD-10-CM | POA: Diagnosis present

## 2021-11-19 MED ORDER — IOHEXOL 300 MG/ML  SOLN
100.0000 mL | Freq: Once | INTRAMUSCULAR | Status: AC | PRN
  Administered 2021-11-19: 100 mL via INTRAVENOUS

## 2021-11-21 ENCOUNTER — Encounter (HOSPITAL_COMMUNITY)
Admission: RE | Admit: 2021-11-21 | Discharge: 2021-11-21 | Disposition: A | Discharge status: Home / Self Care | Payer: BC Managed Care – PPO | Source: Ambulatory Visit | Attending: Cardiology | Admitting: Cardiology

## 2021-11-21 ENCOUNTER — Encounter: Payer: Self-pay | Admitting: Family Medicine

## 2021-11-21 DIAGNOSIS — R5383 Other fatigue: Secondary | ICD-10-CM | POA: Insufficient documentation

## 2021-11-21 DIAGNOSIS — Z79899 Other long term (current) drug therapy: Secondary | ICD-10-CM | POA: Insufficient documentation

## 2021-11-21 DIAGNOSIS — Z48812 Encounter for surgical aftercare following surgery on the circulatory system: Secondary | ICD-10-CM | POA: Diagnosis not present

## 2021-11-21 DIAGNOSIS — R42 Dizziness and giddiness: Secondary | ICD-10-CM | POA: Diagnosis not present

## 2021-11-21 DIAGNOSIS — I2510 Atherosclerotic heart disease of native coronary artery without angina pectoris: Secondary | ICD-10-CM | POA: Diagnosis not present

## 2021-11-21 DIAGNOSIS — I480 Paroxysmal atrial fibrillation: Secondary | ICD-10-CM | POA: Insufficient documentation

## 2021-11-21 DIAGNOSIS — Z955 Presence of coronary angioplasty implant and graft: Secondary | ICD-10-CM | POA: Insufficient documentation

## 2021-11-21 DIAGNOSIS — R55 Syncope and collapse: Secondary | ICD-10-CM | POA: Insufficient documentation

## 2021-11-23 ENCOUNTER — Other Ambulatory Visit: Payer: Self-pay | Admitting: Family Medicine

## 2021-11-23 ENCOUNTER — Encounter (HOSPITAL_COMMUNITY)
Admission: RE | Admit: 2021-11-23 | Discharge: 2021-11-23 | Disposition: A | Discharge status: Home / Self Care | Payer: BC Managed Care – PPO | Source: Ambulatory Visit | Attending: Cardiology | Admitting: Cardiology

## 2021-11-23 DIAGNOSIS — R221 Localized swelling, mass and lump, neck: Secondary | ICD-10-CM

## 2021-11-23 DIAGNOSIS — Z955 Presence of coronary angioplasty implant and graft: Secondary | ICD-10-CM

## 2021-11-24 ENCOUNTER — Emergency Department (HOSPITAL_BASED_OUTPATIENT_CLINIC_OR_DEPARTMENT_OTHER)
Admission: EM | Admit: 2021-11-24 | Discharge: 2021-11-24 | Disposition: A | Discharge status: Home / Self Care | Payer: BC Managed Care – PPO | Attending: Emergency Medicine | Admitting: Emergency Medicine

## 2021-11-24 ENCOUNTER — Other Ambulatory Visit: Payer: Self-pay

## 2021-11-24 ENCOUNTER — Encounter (HOSPITAL_BASED_OUTPATIENT_CLINIC_OR_DEPARTMENT_OTHER): Payer: Self-pay

## 2021-11-24 ENCOUNTER — Telehealth: Payer: Self-pay

## 2021-11-24 ENCOUNTER — Telehealth: Payer: Self-pay | Admitting: Cardiology

## 2021-11-24 DIAGNOSIS — Z7902 Long term (current) use of antithrombotics/antiplatelets: Secondary | ICD-10-CM | POA: Insufficient documentation

## 2021-11-24 DIAGNOSIS — Y92410 Unspecified street and highway as the place of occurrence of the external cause: Secondary | ICD-10-CM | POA: Insufficient documentation

## 2021-11-24 DIAGNOSIS — Z7982 Long term (current) use of aspirin: Secondary | ICD-10-CM | POA: Diagnosis not present

## 2021-11-24 DIAGNOSIS — I2510 Atherosclerotic heart disease of native coronary artery without angina pectoris: Secondary | ICD-10-CM | POA: Insufficient documentation

## 2021-11-24 DIAGNOSIS — S5012XA Contusion of left forearm, initial encounter: Secondary | ICD-10-CM | POA: Diagnosis not present

## 2021-11-24 DIAGNOSIS — S59912A Unspecified injury of left forearm, initial encounter: Secondary | ICD-10-CM | POA: Diagnosis present
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
## 2021-11-24 NOTE — ED Notes (Signed)
Patient verbalizes understanding of discharge instructions. Opportunity for questioning and answers were provided. Patient discharged from ED.  °

## 2021-11-24 NOTE — Telephone Encounter (Signed)
Left message for patient to call back  

## 2021-11-24 NOTE — Telephone Encounter (Signed)
Pt returned call. Transferred to Olin Hauser, Therapist, sports.
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
## 2021-11-24 NOTE — Telephone Encounter (Addendum)
Call came straight to triage. Informed patient that Dr. Johney Frame advise him to get checked out for bleeding, since he is on blood thinners. Patient agreed and will go get evaluated in the ED.
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
## 2021-11-25 ENCOUNTER — Encounter (HOSPITAL_COMMUNITY): Payer: BC Managed Care – PPO

## 2021-11-25 NOTE — Progress Notes (Deleted)
HPI:  Mr.Haadi Lonzo is a 61 y.o. male, who is here today to follow on recent ED visit.  Review of Systems Rest see pertinent positives and negatives per HPI.  Current Outpatient Medications on File Prior to Visit  Medication Sig Dispense Refill   acetaminophen (TYLENOL) 500 MG tablet Take 500-1,000 mg by mouth every 6 (six) hours as needed (pain.).     aspirin EC 81 MG tablet Take 1 tablet (81 mg total) by mouth daily. 90 tablet 3   aspirin-acetaminophen-caffeine (EXCEDRIN MIGRAINE) 250-250-65 MG tablet Take 1-2 tablets by mouth 2 (two) times daily as needed for headache.     cephALEXin (KEFLEX) 500 MG capsule Take 500 mg by mouth 4 (four) times daily.     clopidogrel (PLAVIX) 75 MG tablet Take 1 tablet (75 mg total) by mouth daily. 90 tablet 3   diltiazem (CARDIZEM) 30 MG tablet Take 1 tablet (30 mg total) by mouth 4 (four) times daily as needed. 30 tablet 3   ezetimibe (ZETIA) 10 MG tablet Take 1 tablet (10 mg total) by mouth daily. 90 tablet 3   ibuprofen (ADVIL) 200 MG tablet Take 400-600 mg by mouth every 8 (eight) hours as needed (pain.).     Multiple Vitamins-Minerals (MULTI FOR HIM) TABS Take 1 tablet by mouth in the morning.     nitroGLYCERIN (NITROSTAT) 0.4 MG SL tablet Place 1 tablet (0.4 mg total) under the tongue every 5 (five) minutes as needed for chest pain. 90 tablet 3   Polyethyl Glycol-Propyl Glycol (LUBRICANT EYE DROPS) 0.4-0.3 % SOLN Place 1-2 drops into both eyes 3 (three) times daily as needed (dry/irritated eyes.).     rosuvastatin (CRESTOR) 20 MG tablet Take 2 tablets (40 mg total) by mouth daily. 180 tablet 3   No current facility-administered medications on file prior to visit.    Past Medical History:  Diagnosis Date   CAD (coronary artery disease) 07/13/2021   CCTA 06/2021: CAC score 260 (84th percentile); mLAD 70-99; mLAD FFR pos (0.60) Status post DES to the mid LAD 06/2021   Carotid stenosis 08/11/2021   Carotid US 07/2021: Bilat ICA 1-39   Deafness  in right ear    Family history of pancreatic cancer    Family history of prostate cancer    Family history of skin cancer    Hyperlipidemia LDL goal <70 08/20/2018   PAF (paroxysmal atrial fibrillation) ([place]) 05/06/2021   Sleep apnea    Allergies  Allergen Reactions   Bee Venom Anaphylaxis    Social History   Socioeconomic History   Marital status: Married    Spouse name: Verdis Frederickson   Number of children: 4   Years of education: Law   Highest education level: Professional school degree (e.g., MD, DDS, DVM, JD)  Occupational History   Occupation: [employer]    Comment: [employer]  Tobacco Use   Smoking status: Never   Smokeless tobacco: Never  Vaping Use   Vaping Use: Never used  Substance and Sexual Activity   Alcohol use: No    Comment: quit: 2011   Drug use: No   Sexual activity: Yes  Other Topics Concern   Not on file  Social History Narrative   Right handed   Patient lives at home with his family.   Caffeine Use: 2 cups daily   Social Determinants of Health   Financial Resource Strain: Not on file  Food Insecurity: Not on file  Transportation Needs: Not on file  Physical Activity: Not on  file  Stress: Not on file  Social Connections: Not on file    There were no vitals filed for this visit. There is no height or weight on file to calculate BMI.  Physical Exam  ASSESSMENT AND PLAN:   There are no diagnoses linked to this encounter.  No orders of the defined types were placed in this encounter.   No problem-specific Assessment & Plan notes found for this encounter.   No follow-ups on file.   Betty G. [place], MD  [facility]. [place] office.

## 2021-11-28 ENCOUNTER — Inpatient Hospital Stay: Payer: BC Managed Care – PPO | Admitting: Family Medicine

## 2021-11-28 ENCOUNTER — Encounter (HOSPITAL_COMMUNITY): Payer: BC Managed Care – PPO

## 2021-11-29 ENCOUNTER — Telehealth (HOSPITAL_COMMUNITY): Payer: Self-pay | Admitting: *Deleted

## 2021-11-29 NOTE — Progress Notes (Signed)
Cardiac Individual Treatment Plan  Patient Details  Name: Brandon Madden MRN: [id number] Date of Birth: [birth year] Referring Provider:   Flowsheet Row INTENSIVE CARDIAC REHAB ORIENT from 09/27/2021 in [place]  Referring Provider Freada Bergeron, MD       Initial Encounter Date:  [facility] from 09/27/2021 in [place]  Date 09/27/21       Visit Diagnosis: 07/15/21 DES LAD  Patient's Home Medications on Admission:  Current Outpatient Medications:    acetaminophen (TYLENOL) 500 MG tablet, Take 500-1,000 mg by mouth every 6 (six) hours as needed (pain.)., Disp: , Rfl:    aspirin EC 81 MG tablet, Take 1 tablet (81 mg total) by mouth daily., Disp: 90 tablet, Rfl: 3   aspirin-acetaminophen-caffeine (EXCEDRIN MIGRAINE) 250-250-65 MG tablet, Take 1-2 tablets by mouth 2 (two) times daily as needed for headache., Disp: , Rfl:    cephALEXin (KEFLEX) 500 MG capsule, Take 500 mg by mouth 4 (four) times daily., Disp: , Rfl:    clopidogrel (PLAVIX) 75 MG tablet, Take 1 tablet (75 mg total) by mouth daily., Disp: 90 tablet, Rfl: 3   diltiazem (CARDIZEM) 30 MG tablet, Take 1 tablet (30 mg total) by mouth 4 (four) times daily as needed., Disp: 30 tablet, Rfl: 3   ezetimibe (ZETIA) 10 MG tablet, Take 1 tablet (10 mg total) by mouth daily., Disp: 90 tablet, Rfl: 3   ibuprofen (ADVIL) 200 MG tablet, Take 400-600 mg by mouth every 8 (eight) hours as needed (pain.)., Disp: , Rfl:    Multiple Vitamins-Minerals (MULTI FOR HIM) TABS, Take 1 tablet by mouth in the morning., Disp: , Rfl:    nitroGLYCERIN (NITROSTAT) 0.4 MG SL tablet, Place 1 tablet (0.4 mg total) under the tongue every 5 (five) minutes as needed for chest pain., Disp: 90 tablet, Rfl: 3   Polyethyl Glycol-Propyl Glycol (LUBRICANT EYE DROPS) 0.4-0.3 % SOLN, Place 1-2 drops into both eyes 3 (three) times daily as needed (dry/irritated eyes.)., Disp:  , Rfl:    rosuvastatin (CRESTOR) 20 MG tablet, Take 2 tablets (40 mg total) by mouth daily., Disp: 180 tablet, Rfl: 3  Past Medical History: Past Medical History:  Diagnosis Date   CAD (coronary artery disease) 07/13/2021   CCTA 06/2021: CAC score 260 (84th percentile); mLAD 70-99; mLAD FFR pos (0.60) Status post DES to the mid LAD 06/2021   Carotid stenosis 08/11/2021   Carotid US 07/2021: Bilat ICA 1-39   Deafness in right ear    Family history of pancreatic cancer    Family history of prostate cancer    Family history of skin cancer    Hyperlipidemia LDL goal <70 08/20/2018   PAF (paroxysmal atrial fibrillation) ([place]) 05/06/2021   Sleep apnea     Tobacco Use: Social History   Tobacco Use  Smoking Status Never  Smokeless Tobacco Never    Labs: Review Flowsheet  More data exists      Latest Ref Rng & Units 06/03/2020 08/10/2020 09/30/2020 05/06/2021 09/29/2021  Labs for ITP Cardiac and Pulmonary Rehab  Cholestrol 100 - 199 mg/dL 187  237  171  - 142   LDL (calc) 0 - 99 mg/dL 117  172  110  - 78   HDL-C >39 mg/dL 52  50  45  - 51   Trlycerides 0 - 149 mg/dL 102  87  83  - 64   Hemoglobin A1c 4.6 - 6.5 % - - -  5.9  -    Capillary Blood Glucose: No results found for: "GLUCAP"   Exercise Target Goals: Exercise Program Goal: Individual exercise prescription set using results from initial 6 min walk test and THRR while considering  patient's activity barriers and safety.   Exercise Prescription Goal: Initial exercise prescription builds to 30-45 minutes a day of aerobic activity, 2-3 days per week.  Home exercise guidelines will be given to patient during program as part of exercise prescription that the participant will acknowledge.  Activity Barriers & Risk Stratification:  Activity Barriers & Cardiac Risk Stratification - 09/27/21 1210       Activity Barriers & Cardiac Risk Stratification   Activity Barriers Other (comment);Balance Concerns    Comments PPM implantation on  09/01/21. Lifiting restriction less than 10 lbs through 10/10/21. Prior to PPM implantation, pt experienced aura, syncopal episode, effected balance    Cardiac Risk Stratification High             6 Minute Walk:  6 Minute Walk     Row Name 09/27/21 1200         6 Minute Walk   Phase Initial     Distance 1865 feet     Walk Time 6 minutes     # of Rest Breaks 0     MPH 3.53     METS 4.12     RPE 11     Perceived Dyspnea  0     VO2 Peak 14.44     Symptoms No     Resting HR 85 bpm     Resting BP 104/72     Resting Oxygen Saturation  97 %     Exercise Oxygen Saturation  during 6 min walk 99 %     Max Ex. HR 103 bpm     Max Ex. BP 110/74     2 Minute Post BP 104/76              Oxygen Initial Assessment:   Oxygen Re-Evaluation:   Oxygen Discharge (Final Oxygen Re-Evaluation):   Initial Exercise Prescription:  Initial Exercise Prescription - 09/27/21 1200       Date of Initial Exercise RX and Referring Provider   Date 09/27/21    Referring Provider Freada Bergeron, MD    Expected Discharge Date 12/02/21      Bike   Level 2.5    Minutes 15    METs 3.5      NuStep   Level 3    SPM 85    Minutes 15    METs 3.5      Prescription Details   Frequency (times per week) 3    Duration Progress to 30 minutes of continuous aerobic without signs/symptoms of physical distress      Intensity   THRR 40-80% of Max Heartrate 64-129    Ratings of Perceived Exertion 11-13    Perceived Dyspnea 0-4      Progression   Progression Continue to progress workloads to maintain intensity without signs/symptoms of physical distress.      Resistance Training   Training Prescription Yes    Weight 4 lbs    Reps 10-15             Perform Capillary Blood Glucose checks as needed.  Exercise Prescription Changes:   Exercise Prescription Changes     Row Name 10/03/21 0825 10/19/21 0832 10/26/21 0930 11/18/21 0800       Response to Exercise   Blood  Pressure  (Admit) 108/70 100/74 118/72 116/80    Blood Pressure (Exercise) 122/80 138/74 120/76 120/80    Blood Pressure (Exit) 102/64 110/64 98/62 100/70    Heart Rate (Admit) 75 bpm 86 bpm 87 bpm 90 bpm    Heart Rate (Exercise) 105 bpm 121 bpm 127 bpm 122 bpm    Heart Rate (Exit) 79 bpm 85 bpm 89 bpm 91 bpm    Rating of Perceived Exertion (Exercise) 11.75 11 12.5 13    Perceived Dyspnea (Exercise) 0 0 0 0    Symptoms Left arm restrictions for PPM None None None    Comments Pt first day in the CRP2 program Reviewed MET's, goals and home ExRx Reviewed MET's Pt graduated the CRP2 program    Duration Progress to 30 minutes of  aerobic without signs/symptoms of physical distress Progress to 30 minutes of  aerobic without signs/symptoms of physical distress Progress to 30 minutes of  aerobic without signs/symptoms of physical distress Progress to 30 minutes of  aerobic without signs/symptoms of physical distress    Intensity THRR unchanged THRR unchanged THRR unchanged THRR unchanged      Progression   Progression Continue to progress workloads to maintain intensity without signs/symptoms of physical distress. Continue to progress workloads to maintain intensity without signs/symptoms of physical distress. Continue to progress workloads to maintain intensity without signs/symptoms of physical distress. Continue to progress workloads to maintain intensity without signs/symptoms of physical distress.    Average METs 3.1 4.8 4.6 5.25      Resistance Training   Training Prescription Yes No No Yes    Weight 4 lbs  Left arm restrictions PPM -- -- 5 lbs wts    Reps 10-15 -- -- 10-15    Time 10 Minutes -- -- 10 Minutes      Bike   Level 2.5 2.5 2.5 4    Minutes 15 15 15 15     METs 4.1 4.8 5.4 6.9      NuStep   Level 3 3 4 4     SPM 73 -- 90 90    Minutes 15 15 15 15     METs 2.1 --  Pt did not record 3.8 3.6      Home Exercise Plan   Plans to continue exercise at -- Home (comment) Home (comment) Home  (comment)    Frequency -- Add 4 additional days to program exercise sessions. Add 4 additional days to program exercise sessions. Add 4 additional days to program exercise sessions.    Initial Home Exercises Provided -- 10/19/21 10/19/21 10/19/21             Exercise Comments:   Exercise Comments     Row Name 10/03/21 0488 10/19/21 0852 10/26/21 0933 11/18/21 0852     Exercise Comments Pt first day in the CRP2 program. Pt tolerated exercise well with an average MET level of 3.1. Pt is learning his THRR, RPE and ExRx. Off to a great start. Pt will be out of town until next wednesday and wanted guidlines for exercise, reviewed how to check pulse rate and practiced, advised only light walking on a TM for exercise (he is already walking for exercise on his own at home), RPE 11-13. Reviewed MET's, goals and home ExRx. Pt tolerated exercise well with an average MET level of 4.8. Pt will continue to exercise on his own and home for 30-45 mins per session 4 days a week by walking, bands and stationary bike rides. Pt feels good about  his goals so far and is working on wt loss and has already lost some wt. Pt will continue to work on stress management, and living a healthier lifestyle. In the next few weeks, pt would like to start treadmill so he can work his way into jogging again before graduation, will follow up with pt. Reviewed MET's. Pt tolerated exercise well with an average MET level of 4.6. Overall pt is doing well and gaining strength. Reviewed Mets and goals. Pt tolerated exercise well with an average MET level of 5.25. Pt feels good about his goals so far and feels good about making a lifestyle change for becoming more heart healthy. Pt has been working on his wt loss and stress managment and feels good with the progress he has made so far.             Exercise Goals and Review:   Exercise Goals     Row Name 09/27/21 1147             Exercise Goals   Increase Physical Activity  Yes       Intervention Provide advice, education, support and counseling about physical activity/exercise needs.;Develop an individualized exercise prescription for aerobic and resistive training based on initial evaluation findings, risk stratification, comorbidities and participant's personal goals.       Expected Outcomes Short Term: Attend rehab on a regular basis to increase amount of physical activity.;Long Term: Exercising regularly at least 3-5 days a week.;Long Term: Add in home exercise to make exercise part of routine and to increase amount of physical activity.       Increase Strength and Stamina Yes       Intervention Provide advice, education, support and counseling about physical activity/exercise needs.;Develop an individualized exercise prescription for aerobic and resistive training based on initial evaluation findings, risk stratification, comorbidities and participant's personal goals.       Expected Outcomes Short Term: Increase workloads from initial exercise prescription for resistance, speed, and METs.;Short Term: Perform resistance training exercises routinely during rehab and add in resistance training at home;Long Term: Improve cardiorespiratory fitness, muscular endurance and strength as measured by increased METs and functional capacity (6MWT)       Able to understand and use rate of perceived exertion (RPE) scale Yes       Intervention Provide education and explanation on how to use RPE scale       Expected Outcomes Short Term: Able to use RPE daily in rehab to express subjective intensity level;Long Term:  Able to use RPE to guide intensity level when exercising independently       Knowledge and understanding of Target Heart Rate Range (THRR) Yes       Intervention Provide education and explanation of THRR including how the numbers were predicted and where they are located for reference       Expected Outcomes Short Term: Able to state/look up THRR;Long Term: Able to use  THRR to govern intensity when exercising independently;Short Term: Able to use daily as guideline for intensity in rehab       Able to check pulse independently Yes       Intervention Provide education and demonstration on how to check pulse in carotid and radial arteries.;Review the importance of being able to check your own pulse for safety during independent exercise       Expected Outcomes Short Term: Able to explain why pulse checking is important during independent exercise;Long Term: Able to check pulse independently and accurately  Understanding of Exercise Prescription Yes       Intervention Provide education, explanation, and written materials on patient's individual exercise prescription       Expected Outcomes Short Term: Able to explain program exercise prescription;Long Term: Able to explain home exercise prescription to exercise independently                Exercise Goals Re-Evaluation :  Exercise Goals Re-Evaluation     Row Name 10/03/21 0833 10/19/21 0834 11/18/21 0842         Exercise Goal Re-Evaluation   Exercise Goals Review Increase Physical Activity;Increase Strength and Stamina;Able to understand and use rate of perceived exertion (RPE) scale;Knowledge and understanding of Target Heart Rate Range (THRR);Able to check pulse independently;Understanding of Exercise Prescription Increase Physical Activity;Increase Strength and Stamina;Able to understand and use rate of perceived exertion (RPE) scale;Knowledge and understanding of Target Heart Rate Range (THRR);Able to check pulse independently;Understanding of Exercise Prescription Increase Physical Activity;Increase Strength and Stamina;Able to understand and use rate of perceived exertion (RPE) scale;Knowledge and understanding of Target Heart Rate Range (THRR);Able to check pulse independently;Understanding of Exercise Prescription     Comments Pt first day in the CRP2 program. Pt tolerated exercise well with an  average MET level of 3.1. Pt is learning his THRR, RPE and ExRx. Off to a great start. Pt will be out of town until next wednesday and wanted guidlines for exercise, reviewed how to check pulse rate and practiced, advised only light walking on a TM for exercise (he is already walking for exercise on his own at home), RPE 11-13. Reviewed MET's, goals and home ExRx. Pt tolerated exercise well with an average MET level of 4.8. Pt will continue to exercise on his own and home for 30-45 mins per session 4 days a week by walking, bands and stationary bike rides. Pt feels good about his goals so far and is working on wt loss and has already lost some wt. Pt will continue to work on stress management, and living a healthier lifestyle. Reviewed Mets and goals. Pt tolerated exercise well with an average MET level of 5.25. Pt feels good about his goals so far and feels good about making a lifestyle change for becoming more heart healthy. Pt has been working on his wt loss and stress managment and feels good with the progress he has made so far.     Expected Outcomes Will continue to monitor pt and progress workloads as tolerated without sign or symptom Pt will continue to exercise on his own at home. Will continue to monitor pt and progress workloads as tolerated without sign or symptom Pt will continue to exercise on his own at home. Will continue to monitor pt and progress workloads as tolerated without sign or symptom              Discharge Exercise Prescription (Final Exercise Prescription Changes):  Exercise Prescription Changes - 11/18/21 0800       Response to Exercise   Blood Pressure (Admit) 116/80    Blood Pressure (Exercise) 120/80    Blood Pressure (Exit) 100/70    Heart Rate (Admit) 90 bpm    Heart Rate (Exercise) 122 bpm    Heart Rate (Exit) 91 bpm    Rating of Perceived Exertion (Exercise) 13    Perceived Dyspnea (Exercise) 0    Symptoms None    Comments Pt graduated the CRP2 program     Duration Progress to 30 minutes of  aerobic without signs/symptoms  of physical distress    Intensity THRR unchanged      Progression   Progression Continue to progress workloads to maintain intensity without signs/symptoms of physical distress.    Average METs 5.25      Resistance Training   Training Prescription Yes    Weight 5 lbs wts    Reps 10-15    Time 10 Minutes      Bike   Level 4    Minutes 15    METs 6.9      NuStep   Level 4    SPM 90    Minutes 15    METs 3.6      Home Exercise Plan   Plans to continue exercise at Home (comment)    Frequency Add 4 additional days to program exercise sessions.    Initial Home Exercises Provided 10/19/21             Nutrition:  Target Goals: Understanding of nutrition guidelines, daily intake of sodium <1584m, cholesterol <2070m calories 30% from fat and 7% or less from saturated fats, daily to have 5 or more servings of fruits and vegetables.  Biometrics:  Pre Biometrics - 09/27/21 1140       Pre Biometrics   Waist Circumference 41.5 inches    Hip Circumference 42.25 inches    Waist to Hip Ratio 0.98 %    Triceps Skinfold 19 mm    % Body Fat 30.5 %    Grip Strength 34 kg    Flexibility 14 in    Single Leg Stand 30 seconds              Nutrition Therapy Plan and Nutrition Goals:  Nutrition Therapy & Goals - 11/18/21 1002       Nutrition Therapy   Diet Heart Healthy Diet    Drug/Food Interactions Statins/Certain Fruits      Personal Nutrition Goals   Nutrition Goal Patient to choose a daily variety of fruits, vegetables, whole grains, lean protein/plant protein, nonfat dairy as part of heart healthy lifestyle    Personal Goal #2 Patient to limit sodium to <150061maily.    Personal Goal #3 Patient to identify and limit food sources of refined carbohydrates, sodium, saturated fat, trans fat.    Personal Goal #4 Patient to learn strategies for weight loss of 0.5-2.0# per week of weight loss.    Comments  Goals in action. Brandon Madden to attend the [employer] education series and nutrition courses. He has also completed one on one nutrition counseling. He continues to track his intake to aid with weight loss. Per diet recall, patient reports reduction in saturated fat, increased omega 3 consumption, and high fiber foods choices. He continues to prioritize stress management. Lipid panel improved WNL.      Intervention Plan   Intervention Prescribe, educate and counsel regarding individualized specific dietary modifications aiming towards targeted core components such as weight, hypertension, lipid management, diabetes, heart failure and other comorbidities.;Nutrition handout(s) given to patient.    Expected Outcomes Short Term Goal: Understand basic principles of dietary content, such as calories, fat, sodium, cholesterol and nutrients.;Long Term Goal: Adherence to prescribed nutrition plan.             Nutrition Assessments:  Nutrition Assessments - 09/27/21 1703       Rate Your Plate Scores   Pre Score 76            MEDIFICTS Score Key: ?70 Need to make dietary changes  40-70  Heart Healthy Diet ? 40 Therapeutic Level Cholesterol Diet   Flowsheet Row INTENSIVE CARDIAC REHAB ORIENT from 09/27/2021 in [place]  Picture Your Plate Total Score on Admission 76      Picture Your Plate Scores: <43 Unhealthy dietary pattern with much room for improvement. 41-50 Dietary pattern unlikely to meet recommendations for good health and room for improvement. 51-60 More healthful dietary pattern, with some room for improvement.  >60 Healthy dietary pattern, although there may be some specific behaviors that could be improved.    Nutrition Goals Re-Evaluation:  Nutrition Goals Re-Evaluation     Brandon Madden Name 09/27/21 1653 10/21/21 1009 11/18/21 1002         Goals   Current Weight 191 lb 12.8 oz (87 kg) 187 lb 6.3 oz (85 kg) 185 lb 6.5 oz (84.1 kg)      Comment LDL 106, A1c WNL Lipid panel WNL, A1c WNL; patient is down 4.4# since starting with our program. No new A1c at this time; however, most recent labs show A1c in the preDM range at 5.9.     Expected Outcome Patient is extremely motivated to make lifestyle changes to aid with weight loss and heart health. He has started making many dietary changes including reduced carbohydrates and increased lean protein and fiber intake. He is working with [facility] and is down ~11# over the last 6 weeks. 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 Expect continued weight loss and improved lipid panel with continuation of lifestyle changes. Will continues to discuss carbohydrate intake for long term success with weight loss/keep weight off. Goals in action. Brandon Madden continues to work toward committment to lifestyle changes including weight loss, heart healthy diet rich in omega3s and fiber, and stress management. Discussed and answered questions about carbohydrate sources and appropriate carbohydrate itnake to maintain caloric deficit. Goals in action. Brandon Madden continues to attend the [employer] education series and nutrition courses. He has also completed one on one nutrition counseling. He continues to track his intake to aid with weight loss. Per diet recall, patient reports reduction in saturated fat, increased omega 3 consumption, and high fiber foods choices. He continues to prioritize stress management. He is down 6# since starting with our program and down ~21# over the last 5 months. Lipid panel improved WNL.              Nutrition Goals Re-Evaluation:  Nutrition Goals Re-Evaluation     [place] Name 09/27/21 1653 10/21/21 1009 11/18/21 1002         Goals   Current Weight 191 lb 12.8 oz (87 kg) 187 lb 6.3 oz (85 kg) 185 lb 6.5 oz (84.1 kg)     Comment LDL 106, A1c WNL Lipid panel WNL, A1c WNL; patient is down 4.4# since starting with our program.  No new A1c at this time; however, most recent labs show A1c in the preDM range at 5.9.     Expected Outcome Patient is extremely motivated to make lifestyle changes to aid with weight loss and heart health. He has started making many dietary changes including reduced carbohydrates and increased lean protein and fiber intake. He is working with [facility] and is down ~11# over the last 6 weeks. 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 Expect continued weight loss and improved lipid  panel with continuation of lifestyle changes. Will continues to discuss carbohydrate intake for long term success with weight loss/keep weight off. Goals in action. Brandon Madden continues to work toward committment to lifestyle changes including weight loss, heart healthy diet rich in omega3s and fiber, and stress management. Discussed and answered questions about carbohydrate sources and appropriate carbohydrate itnake to maintain caloric deficit. Goals in action. Brandon Madden continues to attend the [employer] education series and nutrition courses. He has also completed one on one nutrition counseling. He continues to track his intake to aid with weight loss. Per diet recall, patient reports reduction in saturated fat, increased omega 3 consumption, and high fiber foods choices. He continues to prioritize stress management. He is down 6# since starting with our program and down ~21# over the last 5 months. Lipid panel improved WNL.              Nutrition Goals Discharge (Final Nutrition Goals Re-Evaluation):  Nutrition Goals Re-Evaluation - 11/18/21 1002       Goals   Current Weight 185 lb 6.5 oz (84.1 kg)    Comment No new A1c at this time; however, most recent labs show A1c in the preDM range at 5.9.    Expected Outcome Goals in action. Brandon Madden continues to attend the [employer] education series and nutrition courses. He has also completed one on one nutrition  counseling. He continues to track his intake to aid with weight loss. Per diet recall, patient reports reduction in saturated fat, increased omega 3 consumption, and high fiber foods choices. He continues to prioritize stress management. He is down 6# since starting with our program and down ~21# over the last 5 months. Lipid panel improved WNL.             Psychosocial: Target Goals: Acknowledge presence or absence of significant depression and/or stress, maximize coping skills, provide positive support system. Participant is able to verbalize types and ability to use techniques and skills needed for reducing stress and depression.  Initial Review & Psychosocial Screening:  Initial Psych Review & Screening - 09/27/21 1515       Initial Review   Current issues with Current Stress Concerns    Source of Stress Concerns Chronic Illness    Comments Brandon Madden is concerned about his recent cardiac events      Family Dynamics   Good Support System? Yes   Brandon Madden has wife, children and extended family and friends for support     Barriers   Psychosocial barriers to participate in program The patient should benefit from training in stress management and relaxation.      Screening Interventions   Interventions Encouraged to exercise;To provide support and resources with identified psychosocial needs    Expected Outcomes Long Term Goal: Stressors or current issues are controlled or eliminated.             Quality of Life Scores:  Quality of Life - 09/27/21 1312       Quality of Life   Select Quality of Life      Quality of Life Scores   Health/Function Pre 20.33 %    Socioeconomic Pre 22.36 %    Psych/Spiritual Pre 22.64 %    Family Pre 24 %    GLOBAL Pre 21.76 %            Scores of 19 and below usually indicate a poorer quality of life in these areas.  A difference of  2-3 points is a clinically meaningful difference.  A difference of 2-3 points in the total score of the  Quality of Life Index has been associated with significant improvement in overall quality of life, self-image, physical symptoms, and general health in studies assessing change in quality of life.  PHQ-9: Review Flowsheet       11/15/2021 09/27/2021 05/06/2021 03/30/2020  Depression screen PHQ 2/9  Decreased Interest 0 0 0 0  Down, Depressed, Hopeless 0 0 0 0  PHQ - 2 Score 0 0 0 0   Interpretation of Total Score  Total Score Depression Severity:  1-4 = Minimal depression, 5-9 = Mild depression, 10-14 = Moderate depression, 15-19 = Moderately severe depression, 20-27 = Severe depression   Psychosocial Evaluation and Intervention:  Psychosocial Evaluation - 10/12/21 1349       Psychosocial Evaluation & Interventions   Interventions Stress management education;Relaxation education;Encouraged to exercise with the program and follow exercise prescription    Comments Brandon Madden has completed 1 exercise session on 8/14.  Will encourage attendance to stress related educational sessions.    Expected Outcomes Myrl will report decrease stress as it relates to his new cardiac issues and employ learned  stress management techniques.    Continue Psychosocial Services  Follow up required by staff             Psychosocial Re-Evaluation:  Psychosocial Re-Evaluation     [place] Name 11/08/21 1107 11/29/21 1249           Psychosocial Re-Evaluation   Current issues with Current Stress Concerns Current Stress Concerns      Comments Brandon Madden feels supported in his efforts to adopt a heart healthy lifestyle. Eager to understand the recovery process from a cardiac event.  This has caused him some stress and worry.  Doing better with this Page feels supported in his efforts to adopt a heart healthy lifestyle. Eager to understand the recovery process from a cardiac event.  Much improved anxiety related to his health however Brandon Madden did elude to personal stress that did not divulge - remarked that only prayer could  change the circumstance. Advised pt that I would pray for him and the situation.      Expected Outcomes Brandon Madden will report lesss stress and worry and emloy skills and methods he has learned during the healthy mind set education classes. Brandon Madden will report lesss stress and worry and emloy skills and methods he has learned during the healthy mind set education classes.      Interventions Stress management education;Encouraged to attend Cardiac Rehabilitation for the exercise Stress management education;Encouraged to attend Cardiac Rehabilitation for the exercise      Continue Psychosocial Services  No Follow up required Follow up required by staff               Psychosocial Discharge (Final Psychosocial Re-Evaluation):  Psychosocial Re-Evaluation - 11/29/21 1249       Psychosocial Re-Evaluation   Current issues with Current Stress Concerns    Comments Brandon Madden feels supported in his efforts to adopt a heart healthy lifestyle. Eager to understand the recovery process from a cardiac event.  Much improved anxiety related to his health however Hien did elude to personal stress that did not divulge - remarked that only prayer could change the circumstance. Advised pt that I would pray for him and the situation.    Expected Outcomes Brandon Madden will report lesss stress and worry and emloy skills and methods he has learned during the healthy mind set education classes.    Interventions Stress  management education;Encouraged to attend Cardiac Rehabilitation for the exercise    Continue Psychosocial Services  Follow up required by staff             Vocational Rehabilitation: Provide vocational rehab assistance to qualifying candidates.   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 Participant will state understanding/return demonstration of topics presented.    Education     Row Name 10/03/21 0900     Education   Cardiac Education Topics Pritikin   Select Workshops     Workshops   Educator Dietitian   Select Nutrition   Nutrition Workshop Fueling a Designer, multimedia   Instruction Review Code 1- Verbalizes Understanding   Class Start Time 0815   Class Stop Time [phone]   Class Time Calculation (min) 49 min    [place] Name 10/14/21 0900     Education   Cardiac Education Topics Pritikin   Select Core Videos     Core Videos   Educator Dietitian   Select Nutrition   Nutrition Overview of the [place]   Instruction Review Code 1- Verbalizes Understanding   Class Start Time 0815   Class Stop Time 0900   Class Time Calculation (min) 45 min    [place] Name 10/17/21 0900     Education   Cardiac Education Topics Pritikin   Select Workshops     Workshops   Educator Exercise Physiologist   Select Exercise   Exercise Workshop Hotel manager and Fall Prevention   Instruction Review Code 1- Verbalizes Understanding   Class Start Time [phone]   Class Stop Time 0900   Class Time Calculation (min) 50 min    [place] Name 10/19/21 1000     Education   Cardiac Education Topics [employer]   Educator Dietitian   Weekly Topic Personalizing Your Pritikin Plate   Instruction Review Code 1- Verbalizes Understanding   Class Start Time 0815   Class Stop Time 0900   Class Time Calculation (min) 45 min    [employer] Name 10/26/21 0900     Education   Cardiac Education Topics [employer]   Educator Dietitian   Weekly Topic Tasty Appetizers and Snacks   Instruction Review Code 1- Verbalizes Understanding   Class Start Time [phone]   Class Stop Time 0850   Class  Time Calculation (min) 40 min    [place] Name 10/28/21 0900     Education   Cardiac Education Topics Pritikin   Select Core Videos     Core Videos   Educator Dietitian   Select Nutrition   Nutrition Other  Label Reading   Instruction Review Code 1- Verbalizes Understanding   Class Start Time [phone]   Class Stop Time 0856   Class Time Calculation (min) 44 min    [place] Name 10/31/21 1000     Education   Cardiac Education Topics Pritikin   Lexicographer Nutrition  Nutrition Calorie Density   Instruction Review Code 1- Verbalizes Understanding   Class Start Time 0810   Class Stop Time 0856   Class Time Calculation (min) 46 min    [place] Name 11/02/21 0900     Education   Cardiac Education Topics [employer]   Educator Dietitian   Weekly Topic Delicious Desserts   Instruction Review Code 1- Verbalizes Understanding   Class Start Time [phone]   Class Stop Time [phone]   Class Time Calculation (min) 31 min    [place] Name 11/04/21 0900     Education   Cardiac Education Topics Pritikin   Select Workshops     Workshops   Educator Exercise Physiologist   Select Psychosocial   Psychosocial Workshop Managing Moods and Relationships   Instruction Review Code 1- Verbalizes Understanding   Class Start Time 0810   Class Stop Time 0851   Class Time Calculation (min) 41 min    [place] Name 11/07/21 0900     Education   Cardiac Education Topics Pritikin   Select Workshops     Workshops   Educator Exercise Physiologist   Select Exercise   Exercise Workshop Exercise Basics: Building Your Action Plan   Instruction Review Code 1- Verbalizes Understanding   Class Start Time [phone]   Class Stop Time 0855   Class Time Calculation (min) 43 min    [place] Name 11/16/21 0900     Education   Cardiac Education Topics [employer]   Educator Dietitian   Weekly Topic  Simple Sides and Sauces   Instruction Review Code 1- Verbalizes Understanding   Class Start Time 0809   Class Stop Time 0840   Class Time Calculation (min) 31 min    [place] Name 11/18/21 0900     Education   Cardiac Education Topics Pritikin   Select Core Videos     Core Videos   Educator Nurse   Select General Education   General Education Hypertension and Heart Disease   Instruction Review Code 1- Verbalizes Understanding   Class Start Time 0815   Class Stop Time 0850   Class Time Calculation (min) 35 min    [place] Name 11/21/21 0900     Education   Cardiac Education Topics Pritikin   Select Core Videos     Core Videos   Educator Dietitian   Select Nutrition   Nutrition Dining Out - Part 1   Instruction Review Code 1- Verbalizes Understanding   Class Start Time 0815   Class Stop Time 0850   Class Time Calculation (min) 35 min    [place] Name 11/23/21 0900     Education   Cardiac Education Topics [employer]   Educator Dietitian   Weekly Topic One-Pot Wonders   Instruction Review Code 1- Verbalizes Understanding   Class Start Time [phone]   Class Stop Time 0845   Class Time Calculation (min) 35 min            Core Videos: Exercise    Move It!  Clinical staff conducted group or individual video education with verbal and written material and guidebook.  Patient learns the recommended Pritikin exercise program. Exercise with the goal of living a long, healthy life. Some of the health benefits of exercise include controlled diabetes, healthier blood pressure levels, improved cholesterol levels, improved heart  and lung capacity, improved sleep, and better body composition. Everyone should speak with their doctor before starting or changing an exercise routine.  Biomechanical Limitations Clinical staff conducted group or individual video education with verbal and written material and guidebook.  Patient learns how biomechanical  limitations can impact exercise and how we can mitigate and possibly overcome limitations to have an impactful and balanced exercise routine.  Body Composition Clinical staff conducted group or individual video education with verbal and written material and guidebook.  Patient learns that body composition (ratio of muscle mass to fat mass) is a key component to assessing overall fitness, rather than body weight alone. Increased fat mass, especially visceral belly fat, can put [place] at increased risk for metabolic syndrome, type 2 diabetes, heart disease, and even death. It is recommended to combine diet and exercise (cardiovascular and resistance training) to improve your body composition. Seek guidance from your physician and exercise physiologist before implementing an exercise routine.  Exercise Action Plan Clinical staff conducted group or individual video education with verbal and written material and guidebook.  Patient learns the recommended strategies to achieve and enjoy long-term exercise adherence, including variety, self-motivation, self-efficacy, and positive decision making. Benefits of exercise include fitness, good health, weight management, more energy, better sleep, less stress, and overall well-being.  Medical   Heart Disease Risk Reduction Clinical staff conducted group or individual video education with verbal and written material and guidebook.  Patient learns our heart is our most vital organ as it circulates oxygen, nutrients, white blood cells, and hormones throughout the entire body, and carries waste away. Data supports a plant-based eating plan like the Pritikin Program for its effectiveness in slowing progression of and reversing heart disease. The video provides a number of recommendations to address heart disease.   Metabolic Syndrome and Belly Fat  Clinical staff conducted group or individual video education with verbal and written material and guidebook.  Patient learns  what metabolic syndrome is, how it leads to heart disease, and how one can reverse it and keep it from coming back. You have metabolic syndrome if you have 3 of the following 5 criteria: abdominal obesity, high blood pressure, high triglycerides, low HDL cholesterol, and high blood sugar.  Hypertension and Heart Disease Clinical staff conducted group or individual video education with verbal and written material and guidebook.  Patient learns that high blood pressure, or hypertension, is very common in the [place]. Hypertension is largely due to excessive salt intake, but other important risk factors include being overweight, physical inactivity, drinking too much alcohol, smoking, and not eating enough potassium from fruits and vegetables. High blood pressure is a leading risk factor for heart attack, stroke, congestive heart failure, dementia, kidney failure, and premature death. Long-term effects of excessive salt intake include stiffening of the arteries and thickening of heart muscle and organ damage. Recommendations include ways to reduce hypertension and the risk of heart disease.  Diseases of Our Time - Focusing on Diabetes Clinical staff conducted group or individual video education with verbal and written material and guidebook.  Patient learns why the best way to stop diseases of our time is prevention, through food and other lifestyle changes. Medicine (such as prescription pills and surgeries) is often only a Band-Aid on the problem, not a long-term solution. Most common diseases of our time include obesity, type 2 diabetes, hypertension, heart disease, and cancer. The Pritikin Program is recommended and has been proven to help reduce, reverse, and/or prevent the damaging effects  of metabolic syndrome.  Nutrition   Overview of the Pritikin Eating Plan  Clinical staff conducted group or individual video education with verbal and written material and guidebook.  Patient learns about the  [place] for disease risk reduction. The [place] emphasizes a wide variety of unrefined, minimally-processed carbohydrates, like fruits, vegetables, whole grains, and legumes. Go, Caution, and Stop food choices are explained. Plant-based and lean animal proteins are emphasized. Rationale provided for low sodium intake for blood pressure control, low added sugars for blood sugar stabilization, and low added fats and oils for coronary artery disease risk reduction and weight management.  Calorie Density  Clinical staff conducted group or individual video education with verbal and written material and guidebook.  Patient learns about calorie density and how it impacts the Pritikin Eating Plan. Knowing the characteristics of the food you choose will help you decide whether those foods will lead to weight gain or weight loss, and whether you want to consume more or less of them. Weight loss is usually a side effect of the Pritikin Eating Plan because of its focus on low calorie-dense foods.  Label Reading  Clinical staff conducted group or individual video education with verbal and written material and guidebook.  Patient learns about the Pritikin recommended label reading guidelines and corresponding recommendations regarding calorie density, added sugars, sodium content, and whole grains.  Dining Out - Part 1  Clinical staff conducted group or individual video education with verbal and written material and guidebook.  Patient learns that restaurant meals can be sabotaging because they can be so high in calories, fat, sodium, and/or sugar. Patient learns recommended strategies on how to positively address this and avoid unhealthy pitfalls.  Facts on Fats  Clinical staff conducted group or individual video education with verbal and written material and guidebook.  Patient learns that lifestyle modifications can be just as effective, if not more so, as many medications for lowering  your risk of heart disease. A Pritikin lifestyle can help to reduce your risk of inflammation and atherosclerosis (cholesterol build-up, or plaque, in the artery walls). Lifestyle interventions such as dietary choices and physical activity address the cause of atherosclerosis. A review of the types of fats and their impact on blood cholesterol levels, along with dietary recommendations to reduce fat intake is also included.  Nutrition Action Plan  Clinical staff conducted group or individual video education with verbal and written material and guidebook.  Patient learns how to incorporate Pritikin recommendations into their lifestyle. Recommendations include planning and keeping personal health goals in mind as an important part of their success.  Healthy Mind-Set    Healthy Minds, Bodies, Hearts  Clinical staff conducted group or individual video education with verbal and written material and guidebook.  Patient learns how to identify when they are stressed. Video will discuss the impact of that stress, as well as the many benefits of stress management. Patient will also be introduced to stress management techniques. The way we think, act, and feel has an impact on our hearts.  How Our Thoughts Can Heal Our Hearts  Clinical staff conducted group or individual video education with verbal and written material and guidebook.  Patient learns that negative thoughts can cause depression and anxiety. This can result in negative lifestyle behavior and serious health problems. Cognitive behavioral therapy is an effective method to help control our thoughts in order to change and improve our emotional outlook.  Additional Videos:  Exercise    Improving Performance  Clinical staff conducted group or individual video education with verbal and written material and guidebook.  Patient learns to use a non-linear approach by alternating intensity levels and lengths of time spent exercising to help burn more  calories and lose more body fat. Cardiovascular exercise helps improve heart health, metabolism, hormonal balance, blood sugar control, and recovery from fatigue. Resistance training improves strength, endurance, balance, coordination, reaction time, metabolism, and muscle mass. Flexibility exercise improves circulation, posture, and balance. Seek guidance from your physician and exercise physiologist before implementing an exercise routine and learn your capabilities and proper form for all exercise.  Introduction to Yoga  Clinical staff conducted group or individual video education with verbal and written material and guidebook.  Patient learns about yoga, a discipline of the coming together of mind, breath, and body. The benefits of yoga include improved flexibility, improved range of motion, better posture and core strength, increased lung function, weight loss, and positive self-image. Yoga's heart health benefits include lowered blood pressure, healthier heart rate, decreased cholesterol and triglyceride levels, improved immune function, and reduced stress. Seek guidance from your physician and exercise physiologist before implementing an exercise routine and learn your capabilities and proper form for all exercise.  Medical   Aging: Enhancing Your Quality of Life  Clinical staff conducted group or individual video education with verbal and written material and guidebook.  Patient learns key strategies and recommendations to stay in good physical health and enhance quality of life, such as prevention strategies, having an advocate, securing a [place], and keeping a list of medications and system for tracking them. It also discusses how to avoid risk for bone loss.  Biology of Weight Control  Clinical staff conducted group or individual video education with verbal and written material and guidebook.  Patient learns that weight gain occurs because we consume more  calories than we burn (eating more, moving less). Even if your body weight is normal, you may have higher ratios of fat compared to muscle mass. Too much body fat puts you at increased risk for cardiovascular disease, heart attack, stroke, type 2 diabetes, and obesity-related cancers. In addition to exercise, following the [place] can help reduce your risk.  Decoding Lab Results  Clinical staff conducted group or individual video education with verbal and written material and guidebook.  Patient learns that lab test reflects one measurement whose values change over time and are influenced by many factors, including medication, stress, sleep, exercise, food, hydration, pre-existing medical conditions, and more. It is recommended to use the knowledge from this video to become more involved with your lab results and evaluate your numbers to speak with your doctor.   Diseases of Our Time - Overview  Clinical staff conducted group or individual video education with verbal and written material and guidebook.  Patient learns that according to the CDC, 50% to 70% of chronic diseases (such as obesity, type 2 diabetes, elevated lipids, hypertension, and heart disease) are avoidable through lifestyle improvements including healthier food choices, listening to satiety cues, and increased physical activity.  Sleep Disorders Clinical staff conducted group or individual video education with verbal and written material and guidebook.  Patient learns how good quality and duration of sleep are important to overall health and well-being. Patient also learns about sleep disorders and how they impact health along with recommendations to address them, including discussing with a physician.  Nutrition  Dining Out - Part 2 Clinical staff conducted group or individual  video education with verbal and written material and guidebook.  Patient learns how to plan ahead and communicate in order to maximize their  dining experience in a healthy and nutritious manner. Included are recommended food choices based on the type of restaurant the patient is visiting.   Fueling a Best boy conducted group or individual video education with verbal and written material and guidebook.  There is a strong connection between our food choices and our health. Diseases like obesity and type 2 diabetes are very prevalent and are in large-part due to lifestyle choices. The Pritikin Eating Plan provides plenty of food and hunger-curbing satisfaction. It is easy to follow, affordable, and helps reduce health risks.  Menu Workshop  Clinical staff conducted group or individual video education with verbal and written material and guidebook.  Patient learns that restaurant meals can sabotage health goals because they are often packed with calories, fat, sodium, and sugar. Recommendations include strategies to plan ahead and to communicate with the manager, chef, or server to help order a healthier meal.  Planning Your Eating Strategy  Clinical staff conducted group or individual video education with verbal and written material and guidebook.  Patient learns about the [place] and its benefit of reducing the risk of disease. The [place] does not focus on calories. Instead, it emphasizes high-quality, nutrient-rich foods. By knowing the characteristics of the foods, we choose, we can determine their calorie density and make informed decisions.  Targeting Your Nutrition Priorities  Clinical staff conducted group or individual video education with verbal and written material and guidebook.  Patient learns that lifestyle habits have a tremendous impact on disease risk and progression. This video provides eating and physical activity recommendations based on your personal health goals, such as reducing LDL cholesterol, losing weight, preventing or controlling type 2 diabetes, and reducing high  blood pressure.  Vitamins and Minerals  Clinical staff conducted group or individual video education with verbal and written material and guidebook.  Patient learns different ways to obtain key vitamins and minerals, including through a recommended healthy diet. It is important to discuss all supplements you take with your doctor.   Healthy Mind-Set    Smoking Cessation  Clinical staff conducted group or individual video education with verbal and written material and guidebook.  Patient learns that cigarette smoking and tobacco addiction pose a serious health risk which affects millions of people. Stopping smoking will significantly reduce the risk of heart disease, lung disease, and many forms of cancer. Recommended strategies for quitting are covered, including working with your doctor to develop a successful plan.  Culinary   Becoming a Financial trader conducted group or individual video education with verbal and written material and guidebook.  Patient learns that cooking at home can be healthy, cost-effective, quick, and puts them in control. Keys to cooking healthy recipes will include looking at your recipe, assessing your equipment needs, planning ahead, making it simple, choosing cost-effective seasonal ingredients, and limiting the use of added fats, salts, and sugars.  Cooking - Breakfast and Snacks  Clinical staff conducted group or individual video education with verbal and written material and guidebook.  Patient learns how important breakfast is to satiety and nutrition through the entire day. Recommendations include key foods to eat during breakfast to help stabilize blood sugar levels and to prevent overeating at meals later in the day. Planning ahead is also a key component.  Cooking - Human resources officer  Clinical staff conducted group or individual video education with verbal and written material and guidebook.  Patient learns eating strategies to improve overall  health, including an approach to cook more at home. Recommendations include thinking of animal protein as a side on your plate rather than center stage and focusing instead on lower calorie dense options like vegetables, fruits, whole grains, and plant-based proteins, such as beans. Making sauces in large quantities to freeze for later and leaving the skin on your vegetables are also recommended to maximize your experience.  Cooking - Healthy Salads and Dressing Clinical staff conducted group or individual video education with verbal and written material and guidebook.  Patient learns that vegetables, fruits, whole grains, and legumes are the foundations of the [place]. Recommendations include how to incorporate each of these in flavorful and healthy salads, and how to create homemade salad dressings. Proper handling of ingredients is also covered. Cooking - Soups and [employer] - Soups and Desserts Clinical staff conducted group or individual video education with verbal and written material and guidebook.  Patient learns that Pritikin soups and desserts make for easy, nutritious, and delicious snacks and meal components that are low in sodium, fat, sugar, and calorie density, while high in vitamins, minerals, and filling fiber. Recommendations include simple and healthy ideas for soups and desserts.   Overview     The Pritikin Solution Program Overview Clinical staff conducted group or individual video education with verbal and written material and guidebook.  Patient learns that the results of the [place] Program have been documented in more than 100 articles published in peer-reviewed journals, and the benefits include reducing risk factors for (and, in some cases, even reversing) high cholesterol, high blood pressure, type 2 diabetes, obesity, and more! An overview of the three key pillars of the Pritikin Program will be covered: eating well, doing regular exercise, and having a  healthy mind-set.  WORKSHOPS  Exercise: Exercise Basics: Building Your Action Plan Clinical staff led group instruction and group discussion with PowerPoint presentation and patient guidebook. To enhance the learning environment the use of posters, models and videos may be added. At the conclusion of this workshop, patients will comprehend the difference between physical activity and exercise, as well as the benefits of incorporating both, into their routine. Patients will understand the FITT (Frequency, Intensity, Time, and Type) principle and how to use it to build an exercise action plan. In addition, safety concerns and other considerations for exercise and cardiac rehab will be addressed by the presenter. The purpose of this lesson is to promote a comprehensive and effective weekly exercise routine in order to improve patients' overall level of fitness.   Managing Heart Disease: Your Path to a Healthier Heart Clinical staff led group instruction and group discussion with PowerPoint presentation and patient guidebook. To enhance the learning environment the use of posters, models and videos may be added.At the conclusion of this workshop, patients will understand the anatomy and physiology of the heart. Additionally, they will understand how Pritikin's three pillars impact the risk factors, the progression, and the management of heart disease.  The purpose of this lesson is to provide a high-level overview of the heart, heart disease, and how the Pritikin lifestyle positively impacts risk factors.  Exercise Biomechanics Clinical staff led group instruction and group discussion with PowerPoint presentation and patient guidebook. To enhance the learning environment the use of posters, models and videos may be added. Patients will learn how the structural parts of  their bodies function and how these functions impact their daily activities, movement, and exercise. Patients will learn how to  promote a neutral spine, learn how to manage pain, and identify ways to improve their physical movement in order to promote healthy living. The purpose of this lesson is to expose patients to common physical limitations that impact physical activity. Participants will learn practical ways to adapt and manage aches and pains, and to minimize their effect on regular exercise. Patients will learn how to maintain good posture while sitting, walking, and lifting.  Balance Training and Fall Prevention  Clinical staff led group instruction and group discussion with PowerPoint presentation and patient guidebook. To enhance the learning environment the use of posters, models and videos may be added. At the conclusion of this workshop, patients will understand the importance of their sensorimotor skills (vision, proprioception, and the vestibular system) in maintaining their ability to balance as they age. Patients will apply a variety of balancing exercises that are appropriate for their current level of function. Patients will understand the common causes for poor balance, possible solutions to these problems, and ways to modify their physical environment in order to minimize their fall risk. The purpose of this lesson is to teach patients about the importance of maintaining balance as they age and ways to minimize their risk of falling.  WORKSHOPS   Nutrition:  Fueling a Scientist, research (physical sciences) led group instruction and group discussion with PowerPoint presentation and patient guidebook. To enhance the learning environment the use of posters, models and videos may be added. Patients will review the foundational principles of the [place] and understand what constitutes a serving size in each of the food groups. Patients will also learn Pritikin-friendly foods that are better choices when away from home and review make-ahead meal and snack options. Calorie density will be reviewed and  applied to three nutrition priorities: weight maintenance, weight loss, and weight gain. The purpose of this lesson is to reinforce (in a group setting) the key concepts around what patients are recommended to eat and how to apply these guidelines when away from home by planning and selecting Pritikin-friendly options. Patients will understand how calorie density may be adjusted for different weight management goals.  Mindful Eating  Clinical staff led group instruction and group discussion with PowerPoint presentation and patient guidebook. To enhance the learning environment the use of posters, models and videos may be added. Patients will briefly review the concepts of the [place] and the importance of low-calorie dense foods. The concept of mindful eating will be introduced as well as the importance of paying attention to internal hunger signals. Triggers for non-hunger eating and techniques for dealing with triggers will be explored. The purpose of this lesson is to provide patients with the opportunity to review the basic principles of the [place], discuss the value of eating mindfully and how to measure internal cues of hunger and fullness using the Hunger Scale. Patients will also discuss reasons for non-hunger eating and learn strategies to use for controlling emotional eating.  Targeting Your Nutrition Priorities Clinical staff led group instruction and group discussion with PowerPoint presentation and patient guidebook. To enhance the learning environment the use of posters, models and videos may be added. Patients will learn how to determine their genetic susceptibility to disease by reviewing their family history. Patients will gain insight into the importance of diet as part of an overall healthy lifestyle in mitigating the impact of genetics  and other environmental insults. The purpose of this lesson is to provide patients with the opportunity to assess their personal  nutrition priorities by looking at their family history, their own health history and current risk factors. Patients will also be able to discuss ways of prioritizing and modifying the [place] for their highest risk areas  Menu  Clinical staff led group instruction and group discussion with PowerPoint presentation and patient guidebook. To enhance the learning environment the use of posters, models and videos may be added. Using menus brought in from [employer], or printed from [employer], patients will apply the [place] dining out guidelines that were presented in the [employer] video. Patients will also be able to practice these guidelines in a variety of provided scenarios. The purpose of this lesson is to provide patients with the opportunity to practice hands-on learning of the [place] with actual menus and practice scenarios.  Label Reading Clinical staff led group instruction and group discussion with PowerPoint presentation and patient guidebook. To enhance the learning environment the use of posters, models and videos may be added. Patients will review and discuss the Pritikin label reading guidelines presented in Pritikin's Label Reading Educational series video. Using fool labels brought in from local grocery stores and markets, patients will apply the label reading guidelines and determine if the packaged food meet the Pritikin guidelines. The purpose of this lesson is to provide patients with the opportunity to review, discuss, and practice hands-on learning of the Pritikin Label Reading guidelines with actual packaged food labels. [place] Workshops are designed to teach patients ways to prepare quick, simple, and affordable recipes at home. The importance of nutrition's role in chronic disease risk reduction is reflected in its emphasis in the overall Pritikin program. By learning how to prepare  essential core Pritikin Eating Plan recipes, patients will increase control over what they eat; be able to customize the flavor of foods without the use of added salt, sugar, or fat; and improve the quality of the food they consume. By learning a set of core recipes which are easily assembled, quickly prepared, and affordable, patients are more likely to prepare more healthy foods at home. These workshops focus on convenient breakfasts, simple entres, side dishes, and desserts which can be prepared with minimal effort and are consistent with nutrition recommendations for cardiovascular risk reduction. Cooking [employer] are taught by a Engineer, materials (RD) who has been trained by the [employer]. The chef or RD has a clear understanding of the importance of minimizing - if not completely eliminating - added fat, sugar, and sodium in recipes. Throughout the series of [place] Workshop sessions, patients will learn about healthy ingredients and efficient methods of cooking to build confidence in their capability to prepare    Cooking School weekly topics:  Adding Flavor- Sodium-Free  Fast and Healthy Breakfasts  Powerhouse Plant-Based Proteins  Satisfying Salads and Dressings  Simple Sides and Sauces  International Cuisine-Spotlight on the [place] Zones  Delicious Desserts  Savory Soups  Efficiency Cooking - Meals in a Snap  Tasty Appetizers and Snacks  Comforting Weekend Breakfasts  One-Pot Wonders   Fast Evening Meals  Easy [place] (Psychosocial): New Thoughts, New Behaviors Clinical staff led group instruction and group discussion with PowerPoint presentation and patient guidebook. To enhance the learning environment the use of posters, models  and videos may be added. Patients will learn and practice techniques for developing effective health and lifestyle goals. Patients will be able  to effectively apply the goal setting process learned to develop at least one new personal goal.  The purpose of this lesson is to expose patients to a new skill set of behavior modification techniques such as techniques setting SMART goals, overcoming barriers, and achieving new thoughts and new behaviors.  Managing Moods and Relationships Clinical staff led group instruction and group discussion with PowerPoint presentation and patient guidebook. To enhance the learning environment the use of posters, models and videos may be added. Patients will learn how emotional and chronic stress factors can impact their health and relationships. They will learn healthy ways to manage their moods and utilize positive coping mechanisms. In addition, ICR patients will learn ways to improve communication skills. The purpose of this lesson is to expose patients to ways of understanding how one's mood and health are intimately connected. Developing a healthy outlook can help build positive relationships and connections with others. Patients will understand the importance of utilizing effective communication skills that include actively listening and being heard. They will learn and understand the importance of the "4 Cs" and especially Connections in fostering of a Healthy Mind-Set.  Healthy Sleep for a Healthy Heart Clinical staff led group instruction and group discussion with PowerPoint presentation and patient guidebook. To enhance the learning environment the use of posters, models and videos may be added. At the conclusion of this workshop, patients will be able to demonstrate knowledge of the importance of sleep to overall health, well-being, and quality of life. They will understand the symptoms of, and treatments for, common sleep disorders. Patients will also be able to identify daytime and nighttime behaviors which impact sleep, and they will be able to apply these tools to help manage sleep-related challenges.  The purpose of this lesson is to provide patients with a general overview of sleep and outline the importance of quality sleep. Patients will learn about a few of the most common sleep disorders. Patients will also be introduced to the concept of "sleep hygiene," and discover ways to self-manage certain sleeping problems through simple daily behavior changes. Finally, the workshop will motivate patients by clarifying the links between quality sleep and their goals of heart-healthy living.   Recognizing and Reducing Stress Clinical staff led group instruction and group discussion with PowerPoint presentation and patient guidebook. To enhance the learning environment the use of posters, models and videos may be added. At the conclusion of this workshop, patients will be able to understand the types of stress reactions, differentiate between acute and chronic stress, and recognize the impact that chronic stress has on their health. They will also be able to apply different coping mechanisms, such as reframing negative self-talk. Patients will have the opportunity to practice a variety of stress management techniques, such as deep abdominal breathing, progressive muscle relaxation, and/or guided imagery.  The purpose of this lesson is to educate patients on the role of stress in their lives and to provide healthy techniques for coping with it.  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   Learning Preferences [place];Skilled Demonstration             Education Topics:  Knowledge  Questionnaire Score:  Knowledge Questionnaire Score - 09/27/21 1315       Knowledge Questionnaire Score   Pre Score 22/24             Core Components/Risk Factors/Patient Goals at  Admission:  Personal Goals and Risk Factors at Admission - 09/27/21 1148       Core Components/Risk Factors/Patient Goals on Admission    Weight Management Yes;Obesity;Weight Loss    Intervention Weight Management/Obesity: Establish reasonable short term and long term weight goals.;Obesity: Provide education and appropriate resources to help participant work on and attain dietary goals.    Admit Weight 191 lb 12.8 oz (87 kg)    Expected Outcomes Weight Loss: Understanding of general recommendations for a balanced deficit meal plan, which promotes 1-2 lb weight loss per week and includes a negative energy balance of [phone] kcal/d;Short Term: Continue to assess and modify interventions until short term weight is achieved;Long Term: Adherence to nutrition and physical activity/exercise program aimed toward attainment of established weight goal    Hypertension Yes    Intervention Provide education on lifestyle modifcations including regular physical activity/exercise, weight management, moderate sodium restriction and increased consumption of fresh fruit, vegetables, and low fat dairy, alcohol moderation, and smoking cessation.;Monitor prescription use compliance.    Expected Outcomes Short Term: Continued assessment and intervention until BP is < 140/39m HG in hypertensive participants. < 130/850mHG in hypertensive participants with diabetes, heart failure or chronic kidney disease.;Long Term: Maintenance of blood pressure at goal levels.    Lipids Yes    Intervention Provide education and support for participant on nutrition & aerobic/resistive exercise along with prescribed medications to achieve LDL <7063mHDL >65m33m  Expected Outcomes Short Term: Participant states understanding of desired cholesterol values and is compliant with medications prescribed. Participant is following exercise prescription and nutrition guidelines.;Long Term: Cholesterol controlled with medications as prescribed, with  individualized exercise RX and with personalized nutrition plan. Value goals: LDL < 70mg46mL > 40 mg.    Stress Yes    Intervention Offer individual and/or small group education and counseling on adjustment to heart disease, stress management and health-related lifestyle change. Teach and support self-help strategies.;Refer participants experiencing significant psychosocial distress to appropriate mental health specialists for further evaluation and treatment. When possible, include family members and significant others in education/counseling sessions.    Expected Outcomes Short Term: Participant demonstrates changes in health-related behavior, relaxation and other stress management skills, ability to obtain effective social support, and compliance with psychotropic medications if prescribed.;Long Term: Emotional wellbeing is indicated by absence of clinically significant psychosocial distress or social isolation.    Personal Goal Other Yes    Personal Goal Live a healthier lifestyle. Develop a definitive exercise routine.    Intervention Offer individual and small group education on the Pritikin lifestyle plan. Develop individualized aerobic, resistance, and flexibility program that patient can follow and maintain.    Expected Outcomes Patient will follow recommendations provided on living a healthier lifestyle including healthy eating, healthy mindset, and regular exercise.             Core Components/Risk Factors/Patient Goals Review:   Goals and Risk Factor Review     Row Name 10/03/21 1422 10/12/21 1351 11/08/21 1111 11/29/21 1252       Core Components/Risk Factors/Patient Goals Review   Personal Goals Review Weight Management/Obesity;Stress;Hypertension;Lipids Weight Management/Obesity;Stress;Hypertension;Lipids Weight Management/Obesity;Stress;Hypertension;Lipids Weight Management/Obesity;Stress;Hypertension;Lipids    Review Patient started Cardiac Rehab today, tolerated well, vss,  denies pain, no adverse signs  or symptoms reported. Patient has completed 1 exercise session.  Unable to assess goals at this time.  Pt to return to exercise on 8/28 Brandon Madden has completed 11 exercise/education sessions.  Brandon Madden continues to make progress toward his desired goal. Brandon Madden has lost 7.7 lbs since begining the ICR program.  Vital signs remained well within normal limits.  Optimal managment of his lipids with statin therapy as well as cholesterol absorption inhibitors along with nutrition and exercise.  Brandon Madden feeling more confident within himself which has decreased his stress level.  Has an upcoming appt with Dr. Johney Frame encouraged him to share with his provider how he is feeling and ask any questions on areas he is unsure of. Brandon Madden has completed 18 exercise/education sessions.  Sahej continues to make progress toward his desired goal. Artem did gain back some of his weight loss in the last couple of sessions. His net loss at this present time is  lost 4 lbs since begining the [place] program. He admits to some dietary indiscretions as he is dealing with personal stress that he believes can only be dealt with by prayer.  Vital signs remained well within normal limits however have noticed that he has exceeded his target heart rate on occasion.  Believed to be a result of coping by escaping in exercise and the release of endorphin stimulation with this new stressor .  Optimal managment of his lipids with statin therapy as well as cholesterol absorption inhibitors along with nutrition and exercise.  Brandon Madden feeling more confident within himself regarding his heart.  No longer feels anxious about this however he continues to work through this new stressor.    Expected Outcomes Patient will maintain a healthy lifestyle, as he is provided the knowledge and tools neccessary to complete goal; he will increase exercise stamina during ICR program. Patient will maintain a healthy lifestyle, as he is provided the  knowledge and tools neccessary to complete goal; he will increase exercise stamina during ICR program. Brandon Madden will adopt a heart healthy lifestyle in accordance to the three pillars learned for Pritikin Intensive cardiac rehab: exercise, heart healthy nutrition and healthy mind set. Brandon Madden will adopt a heart healthy lifestyle in accordance to the three pillars learned for Pritikin Intensive cardiac rehab: exercise, heart healthy nutrition and healthy mind set.             Core Components/Risk Factors/Patient Goals at Discharge (Final Review):   Goals and Risk Factor Review - 11/29/21 1252       Core Components/Risk Factors/Patient Goals Review   Personal Goals Review Weight Management/Obesity;Stress;Hypertension;Lipids    Review Brandon Madden has completed 18 exercise/education sessions.  Brandon Madden continues to make progress toward his desired goal. [place] did gain back some of his weight loss in the last couple of sessions. His net loss at this present time is  lost 4 lbs since begining the [place] program. He admits to some dietary indiscretions as he is dealing with personal stress that he believes can only be dealt with by prayer.  Vital signs remained well within normal limits however have noticed that he has exceeded his target heart rate on occasion.  Believed to be a result of coping by escaping in exercise and the release of endorphin

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
## 2021-11-30 ENCOUNTER — Encounter (HOSPITAL_COMMUNITY): Payer: BC Managed Care – PPO

## 2021-12-02 ENCOUNTER — Encounter (HOSPITAL_COMMUNITY)
Admission: RE | Admit: 2021-12-02 | Discharge: 2021-12-02 | Disposition: A | Discharge status: Home / Self Care | Payer: BC Managed Care – PPO | Source: Ambulatory Visit | Attending: Cardiology | Admitting: Cardiology

## 2021-12-02 ENCOUNTER — Encounter: Payer: Self-pay | Admitting: Cardiology

## 2021-12-02 ENCOUNTER — Ambulatory Visit (INDEPENDENT_AMBULATORY_CARE_PROVIDER_SITE_OTHER): Payer: BC Managed Care – PPO

## 2021-12-02 DIAGNOSIS — Z955 Presence of coronary angioplasty implant and graft: Secondary | ICD-10-CM

## 2021-12-02 DIAGNOSIS — R55 Syncope and collapse: Secondary | ICD-10-CM | POA: Diagnosis not present

## 2021-12-02 LAB — CUP PACEART REMOTE DEVICE CHECK
Battery Remaining Longevity: 175 mo
Battery Voltage: 3.2 V
Brady Statistic AP VP Percent: 9.59 %
Brady Statistic AP VS Percent: 10.82 %
Brady Statistic AS VP Percent: 0.1 %
Brady Statistic AS VS Percent: 79.49 %
Brady Statistic RA Percent Paced: 20.26 %
Brady Statistic RV Percent Paced: 9.66 %
Date Time Interrogation Session: 20231013083555
Implantable Lead Implant Date: 20230713
Implantable Lead Implant Date: 20230713
Implantable Lead Location: 753859
Implantable Lead Location: 753860
Implantable Lead Model: 3830
Implantable Lead Model: 5076
Implantable Pulse Generator Implant Date: 20230713
Lead Channel Impedance Value: 361 Ohm
Lead Channel Impedance Value: 399 Ohm
Lead Channel Impedance Value: 475 Ohm
Lead Channel Impedance Value: 551 Ohm
Lead Channel Pacing Threshold Amplitude: 0.5 V
Lead Channel Pacing Threshold Amplitude: 0.75 V
Lead Channel Pacing Threshold Pulse Width: 0.4 ms
Lead Channel Pacing Threshold Pulse Width: 0.4 ms
Lead Channel Sensing Intrinsic Amplitude: 1.875 mV
Lead Channel Sensing Intrinsic Amplitude: 1.875 mV
Lead Channel Sensing Intrinsic Amplitude: 13 mV
Lead Channel Sensing Intrinsic Amplitude: 13 mV
Lead Channel Setting Pacing Amplitude: 1.5 V
Lead Channel Setting Pacing Amplitude: 2 V
Lead Channel Setting Pacing Pulse Width: 0.4 ms
Lead Channel Setting Sensing Sensitivity: 1.2 mV

## 2021-12-05 ENCOUNTER — Encounter (HOSPITAL_COMMUNITY)
Admission: RE | Admit: 2021-12-05 | Discharge: 2021-12-05 | Disposition: A | Discharge status: Home / Self Care | Payer: BC Managed Care – PPO | Source: Ambulatory Visit | Attending: Cardiology | Admitting: Cardiology

## 2021-12-05 DIAGNOSIS — Z955 Presence of coronary angioplasty implant and graft: Secondary | ICD-10-CM

## 2021-12-06 ENCOUNTER — Encounter: Payer: Self-pay | Admitting: Cardiology

## 2021-12-06 ENCOUNTER — Encounter (INDEPENDENT_AMBULATORY_CARE_PROVIDER_SITE_OTHER): Payer: BC Managed Care – PPO | Admitting: Cardiology

## 2021-12-06 VITALS — BP 110/76 | HR 80 | Ht 66.0 in | Wt 189.4 lb

## 2021-12-06 DIAGNOSIS — R55 Syncope and collapse: Secondary | ICD-10-CM

## 2021-12-06 NOTE — Progress Notes (Signed)
Electrophysiology Office Note   Date:  12/06/2021   ID:  Holten Marzec, DOB [birth year], MRN [id number]  PCP:  Martinique, Betty G, MD  Cardiologist: Johney Frame Primary Electrophysiologist:  Arayah Krouse Meredith Leeds, MD    Chief Complaint: Syncope   History of Present Illness: Brandon Madden is a 61 y.o. male who is being seen today for the evaluation of syncope at the request of [place], Malka So, MD. Presenting today for electrophysiology evaluation.  He has a history significant hypertension, COVID-19 infection.  He has been having episodes of syncope.  He was sitting at a restaurant and suddenly felt like he was going into a hole.  He had dizziness and profound fatigue.  He lost consciousness for 20 to 25 seconds before returning to baseline.  The day before he completed an 11 mile bike ride.  He had several similar episodes but not lost consciousness.  He had a Linq monitor implanted.  Linq monitor showed a 22-hour episode of atrial fibrillation as well as pauses associated with syncope.  He is now status post Medtronic dual-chamber pacemaker implanted 09/01/2021.  Today, denies symptoms of palpitations, chest pain, shortness of breath, orthopnea, PND, lower extremity edema, claudication, dizziness, presyncope, syncope, bleeding, or neurologic sequela. The patient is tolerating medications without difficulties.  Since his device was implanted, he has had no further episodes of syncope.  He has been able to do all of his daily activities without restriction.  He is quite concerned about his vagal episodes and wishes to find a cause.  Aside from that, he has no complaints.    Past Medical History:  Diagnosis Date   CAD (coronary artery disease) 07/13/2021   CCTA 06/2021: CAC score 260 (84th percentile); mLAD 70-99; mLAD FFR pos (0.60) Status post DES to the mid LAD 06/2021   Carotid stenosis 08/11/2021   Carotid US 07/2021: Bilat ICA 1-39   Deafness in right ear    Family history of pancreatic cancer     Family history of prostate cancer    Family history of skin cancer    Hyperlipidemia LDL goal <70 08/20/2018   PAF (paroxysmal atrial fibrillation) ([place]) 05/06/2021   Sleep apnea    Past Surgical History:  Procedure Laterality Date   CARDIAC CATHETERIZATION     CORONARY STENT INTERVENTION N/A 07/15/2021   Procedure: CORONARY STENT INTERVENTION;  Surgeon: Belva Crome, MD;  Location: [facility];  Service: Cardiovascular;  Laterality: N/A;   INSERT / REPLACE / REMOVE PACEMAKER     INTRAVASCULAR ULTRASOUND/IVUS N/A 07/15/2021   Procedure: Intravascular Ultrasound/IVUS;  Surgeon: Belva Crome, MD;  Location: [facility];  Service: Cardiovascular;  Laterality: N/A;   LEFT HEART CATH AND CORONARY ANGIOGRAPHY N/A 07/15/2021   Procedure: LEFT HEART CATH AND CORONARY ANGIOGRAPHY;  Surgeon: Belva Crome, MD;  Location: [facility];  Service: Cardiovascular;  Laterality: N/A;   LOOP RECORDER REMOVAL N/A 09/01/2021   Procedure: LOOP RECORDER REMOVAL;  Surgeon: Constance Haw, MD;  Location: [facility];  Service: Cardiovascular;  Laterality: N/A;   NASAL SEPTUM SURGERY     PACEMAKER IMPLANT N/A 09/01/2021   Procedure: PACEMAKER IMPLANT;  Surgeon: Constance Haw, MD;  Location: [facility];  Service: Cardiovascular;  Laterality: N/A;     Current Outpatient Medications  Medication Sig Dispense Refill   acetaminophen (TYLENOL) 500 MG tablet Take 500-1,000 mg by mouth every 6 (six) hours as needed (pain.).     aspirin EC  81 MG tablet Take 1 tablet (81 mg total) by mouth daily. 90 tablet 3   aspirin-acetaminophen-caffeine (EXCEDRIN MIGRAINE) 250-250-65 MG tablet Take 1-2 tablets by mouth 2 (two) times daily as needed for headache.     clopidogrel (PLAVIX) 75 MG tablet Take 1 tablet (75 mg total) by mouth daily. 90 tablet 3   diltiazem (CARDIZEM) 30 MG tablet Take 1 tablet (30 mg total) by mouth 4 (four) times daily as needed. 30 tablet 3    ezetimibe (ZETIA) 10 MG tablet Take 1 tablet (10 mg total) by mouth daily. 90 tablet 3   ibuprofen (ADVIL) 200 MG tablet Take 400-600 mg by mouth every 8 (eight) hours as needed (pain.).     Multiple Vitamins-Minerals (MULTI FOR HIM) TABS Take 1 tablet by mouth in the morning.     Polyethyl Glycol-Propyl Glycol (LUBRICANT EYE DROPS) 0.4-0.3 % SOLN Place 1-2 drops into both eyes 3 (three) times daily as needed (dry/irritated eyes.).     rosuvastatin (CRESTOR) 20 MG tablet Take 2 tablets (40 mg total) by mouth daily. 180 tablet 3   nitroGLYCERIN (NITROSTAT) 0.4 MG SL tablet Place 1 tablet (0.4 mg total) under the tongue every 5 (five) minutes as needed for chest pain. 90 tablet 3   No current facility-administered medications for this visit.    Allergies:   Bee venom   Social History:  The patient  reports that he has never smoked. He has never used smokeless tobacco. He reports that he does not drink alcohol and does not use drugs.   Family History:  The patient's family history includes Heart attack in his father; Pancreatic cancer in his maternal grandmother; Prostate cancer in his brother and brother; Skin cancer in his sister.   ROS:  Please see the history of present illness.   Otherwise, review of systems is positive for none.   All other systems are reviewed and negative.   PHYSICAL EXAM: VS:  BP 110/76   Pulse 80   Ht 5\' 6"  (1.676 m)   Wt 189 lb 6.4 oz (85.9 kg)   SpO2 95%   BMI 30.57 kg/m  , BMI Body mass index is 30.57 kg/m. GEN: Well nourished, well developed, in no acute distress  HEENT: normal  Neck: no JVD, carotid bruits, or masses Cardiac: RRR; no murmurs, rubs, or gallops,no edema  Respiratory:  clear to auscultation bilaterally, normal work of breathing GI: soft, nontender, nondistended, + BS MS: no deformity or atrophy  Skin: warm and dry, device site well healed Neuro:  Strength and sensation are intact Psych: euthymic mood, full affect  EKG:  EKG is not  ordered today. Personal review of the ekg ordered 11/24/21 shows sinus rhythm  Personal review of the device interrogation today. Results in [facility]: 09/29/2021: ALT 36 10/21/2021: BUN 17; Creatinine, Ser 0.91; Magnesium 2.1; Potassium 4.4; Sodium 139 11/15/2021: Hemoglobin 15.9; Platelets 168.0    Lipid Panel     Component Value Date/Time   CHOL 142 09/29/2021 0901   TRIG 64 09/29/2021 0901   HDL 51 09/29/2021 0901   CHOLHDL 2.8 09/29/2021 0901   CHOLHDL 5 03/30/2020 1011   VLDL 24.2 03/30/2020 1011   LDLCALC 78 09/29/2021 0901     Wt Readings from Last 3 Encounters:  12/06/21 189 lb 6.4 oz (85.9 kg)  11/24/21 186 lb 15.2 oz (84.8 kg)  11/15/21 187 lb (84.8 kg)      Other studies Reviewed: Additional studies/ records that were reviewed today  include: TTE 06/03/20  Review of the above records today demonstrates:   1. Left ventricular ejection fraction, by estimation, is 55 to 60%. The  left ventricle has normal function. The left ventricle has no regional  wall motion abnormalities. Left ventricular diastolic parameters are  consistent with Grade I diastolic  dysfunction (impaired relaxation).   2. Right ventricular systolic function is normal. The right ventricular  size is normal.   3. The mitral valve is normal in structure. Trivial mitral valve  regurgitation.   4. The aortic valve is normal in structure. There is mild thickening of  the aortic valve. Aortic valve regurgitation is trivial. No aortic  stenosis is present.   Cardiac monitor 04/29/2020 personally reviewed Patch wear time was 13 days and 9 hours Predominant rhythm was NSR with average HR 84 (ranged from 48-172bpm) Rare SVE, rare PVCs <1% No Afib, significant pauses or sustained arrhythmias Triggered events mainly correlated with PACs. Overall, normal cardiac monitor    ASSESSMENT AND PLAN:  1.  Syncope: Linq monitor with significant episodes of bradycardia with vagal episodes and  pauses.  He is now status post Medtronic dual-chamber pacemaker implanted 09/01/2021.  Device function appropriate.  No changes.  He is quite concerned that he is continue to have vagal episodes.  Despite that, he has had no further symptoms of syncope or near syncope since his pacemaker was implanted.  The rate drop algorithm has been turned on.  He has been assured that he does not have any obvious conduction system disease.  We Stevey Stapleton continue with current management.  2.  Paroxysmal atrial fibrillation: Currently not anticoagulated.  CHA2DS2-VASc of 1.  On as needed diltiazem.  3.  Coronary artery calcifications: Currently on Crestor 40 mg daily.  Plan per primary cardiology.    Current medicines are reviewed at length with the patient today.   The patient does not have concerns regarding his medicines.  The following changes were made today: None  Labs/ tests ordered today include:  No orders of the defined types were placed in this encounter.     Disposition:   FU with Jalicia Roszak 12 months  Signed, Jamine Highfill Meredith Leeds, MD  12/06/2021 4:02 PM     [address] [place] [address] [phone] (office) [phone] (fax)

## 2021-12-06 NOTE — Patient Instructions (Signed)
Medication Instructions:  Your physician recommends that you continue on your current medications as directed. Please refer to the Current Medication list given to you today.  *If you need a refill on your cardiac medications before your next appointment, please call your pharmacy*   Lab Work: None ordered If you have labs (blood work) drawn today and your tests are completely normal, you will receive your results only by: MyChart Message (if you have MyChart) OR A paper copy in the mail If you have any lab test that is abnormal or we need to change your treatment, we will call you to review the results.   Testing/Procedures: None ordered   Follow-Up: At [facility], you and your health needs are our priority.  As part of our continuing mission to provide you with exceptional heart care, we have created designated Provider Care Teams.  These Care Teams include your primary Cardiologist (physician) and Advanced Practice Providers (APPs -  Physician Assistants and Nurse Practitioners) who all work together to provide you with the care you need, when you need it.  Remote monitoring is used to monitor your Pacemaker or ICD from home. This monitoring reduces the number of office visits required to check your device to one time per year. It allows us to keep an eye on the functioning of your device to ensure it is working properly. You are scheduled for a device check from home on 03/03/2022. You may send your transmission at any time that day. If you have a wireless device, the transmission will be sent automatically. After your physician reviews your transmission, you will receive a postcard with your next transmission date.  Your next appointment:   1 year(s)  The format for your next appointment:   In Person  Provider:   Will Camnitz, MD    Thank you for choosing [facility]!!   Colbert Curenton, RN [phone]    Other Instructions   Important Information About  [facility]       

## 2021-12-07 ENCOUNTER — Encounter (HOSPITAL_COMMUNITY)
Admission: RE | Admit: 2021-12-07 | Discharge: 2021-12-07 | Disposition: A | Discharge status: Home / Self Care | Payer: BC Managed Care – PPO | Source: Ambulatory Visit | Attending: Cardiology | Admitting: Cardiology

## 2021-12-07 DIAGNOSIS — Z955 Presence of coronary angioplasty implant and graft: Secondary | ICD-10-CM | POA: Diagnosis not present

## 2021-12-07 LAB — CUP PACEART INCLINIC DEVICE CHECK
Battery Remaining Longevity: 175 mo
Battery Voltage: 3.19 V
Brady Statistic AP VP Percent: 8.02 %
Brady Statistic AP VS Percent: 8.86 %
Brady Statistic AS VP Percent: 0.05 %
Brady Statistic AS VS Percent: 83.07 %
Brady Statistic RA Percent Paced: 16.84 %
Brady Statistic RV Percent Paced: 8.06 %
Date Time Interrogation Session: 20231017152400
Implantable Lead Implant Date: 20230713
Implantable Lead Implant Date: 20230713
Implantable Lead Location: 753859
Implantable Lead Location: 753860
Implantable Lead Model: 3830
Implantable Lead Model: 5076
Implantable Pulse Generator Implant Date: 20230713
Lead Channel Impedance Value: 380 Ohm
Lead Channel Impedance Value: 437 Ohm
Lead Channel Impedance Value: 494 Ohm
Lead Channel Impedance Value: 570 Ohm
Lead Channel Pacing Threshold Amplitude: 0.5 V
Lead Channel Pacing Threshold Amplitude: 0.75 V
Lead Channel Pacing Threshold Pulse Width: 0.4 ms
Lead Channel Pacing Threshold Pulse Width: 0.4 ms
Lead Channel Sensing Intrinsic Amplitude: 13.25 mV
Lead Channel Sensing Intrinsic Amplitude: 13.375 mV
Lead Channel Sensing Intrinsic Amplitude: 2.375 mV
Lead Channel Sensing Intrinsic Amplitude: 2.625 mV
Lead Channel Setting Pacing Amplitude: 1.5 V
Lead Channel Setting Pacing Amplitude: 2 V
Lead Channel Setting Pacing Pulse Width: 0.4 ms
Lead Channel Setting Sensing Sensitivity: 1.2 mV

## 2021-12-07 NOTE — Progress Notes (Signed)
Remote pacemaker transmission.   

## 2021-12-09 ENCOUNTER — Encounter: Payer: BC Managed Care – PPO | Admitting: Cardiology

## 2021-12-09 ENCOUNTER — Encounter (HOSPITAL_COMMUNITY)
Admission: RE | Admit: 2021-12-09 | Discharge: 2021-12-09 | Disposition: A | Discharge status: Home / Self Care | Payer: BC Managed Care – PPO | Source: Ambulatory Visit | Attending: Cardiology | Admitting: Cardiology

## 2021-12-09 DIAGNOSIS — Z955 Presence of coronary angioplasty implant and graft: Secondary | ICD-10-CM | POA: Diagnosis not present

## 2021-12-14 ENCOUNTER — Telehealth (HOSPITAL_COMMUNITY): Payer: Self-pay

## 2021-12-14 ENCOUNTER — Encounter (HOSPITAL_COMMUNITY)
Admission: RE | Admit: 2021-12-14 | Discharge: 2021-12-14 | Disposition: A | Discharge status: Home / Self Care | Payer: BC Managed Care – PPO | Source: Ambulatory Visit | Attending: Cardiology | Admitting: Cardiology

## 2021-12-14 VITALS — Ht 65.75 in | Wt 186.3 lb

## 2021-12-14 DIAGNOSIS — Z955 Presence of coronary angioplasty implant and graft: Secondary | ICD-10-CM | POA: Diagnosis not present

## 2021-12-14 NOTE — Telephone Encounter (Signed)
CARDIAC REHAB PHASE 2  Left VM to see if pt is interested in signing up for additional [facility] resources post graduation from [employer] [facility] 12/14/2021 2:07 PM
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
## 2021-12-16 ENCOUNTER — Encounter (HOSPITAL_COMMUNITY): Payer: BC Managed Care – PPO

## 2022-01-03 ENCOUNTER — Encounter: Payer: Self-pay | Admitting: Family Medicine

## 2022-01-03 ENCOUNTER — Encounter: Payer: BC Managed Care – PPO | Attending: Cardiology

## 2022-01-03 DIAGNOSIS — E785 Hyperlipidemia, unspecified: Secondary | ICD-10-CM | POA: Insufficient documentation

## 2022-01-03 LAB — LIPID PANEL
Chol/HDL Ratio: 2.3 ratio (ref 0.0–5.0)
Cholesterol, Total: 116 mg/dL (ref 100–199)
HDL: 51 mg/dL (ref >39)
LDL Chol Calc (NIH): 52 mg/dL (ref 0–99)
Triglycerides: 60 mg/dL (ref 0–149)
VLDL Cholesterol Cal: 13 mg/dL (ref 5–40)

## 2022-01-03 LAB — ALT: ALT: 46 IU/L — ABNORMAL HIGH (ref 0–44)

## 2022-01-04 ENCOUNTER — Telehealth: Payer: Self-pay | Admitting: *Deleted

## 2022-01-04 DIAGNOSIS — Z79899 Other long term (current) drug therapy: Secondary | ICD-10-CM

## 2022-01-04 NOTE — Telephone Encounter (Signed)
-----   Message from Beatrice Lecher, [place] sent at 01/04/2022  8:48 AM EST ----- Results sent to [name] via MyChart. See MyChart comment. PLAN: -Repeat LFTs in 3 months Tereso Newcomer, PA-C    01/04/2022 8:44 AM
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
## 2022-02-03 ENCOUNTER — Encounter: Payer: Self-pay | Admitting: Physician Assistant

## 2022-02-03 ENCOUNTER — Ambulatory Visit
Admission: RE | Admit: 2022-02-03 | Discharge: 2022-02-03 | Disposition: A | Discharge status: Home / Self Care | Payer: BC Managed Care – PPO | Source: Ambulatory Visit | Attending: Physician Assistant | Admitting: Physician Assistant

## 2022-02-03 ENCOUNTER — Encounter: Payer: BC Managed Care – PPO | Attending: Cardiology | Admitting: Physician Assistant

## 2022-02-03 VITALS — BP 102/64 | HR 76 | Ht 66.0 in | Wt 193.8 lb

## 2022-02-03 DIAGNOSIS — I480 Paroxysmal atrial fibrillation: Secondary | ICD-10-CM | POA: Diagnosis not present

## 2022-02-03 DIAGNOSIS — I2510 Atherosclerotic heart disease of native coronary artery without angina pectoris: Secondary | ICD-10-CM | POA: Diagnosis not present

## 2022-02-03 DIAGNOSIS — E785 Hyperlipidemia, unspecified: Secondary | ICD-10-CM | POA: Insufficient documentation

## 2022-02-03 DIAGNOSIS — R072 Precordial pain: Secondary | ICD-10-CM | POA: Diagnosis not present

## 2022-02-03 DIAGNOSIS — Z950 Presence of cardiac pacemaker: Secondary | ICD-10-CM

## 2022-02-03 DIAGNOSIS — R0602 Shortness of breath: Secondary | ICD-10-CM | POA: Insufficient documentation

## 2022-02-03 MED ORDER — ROSUVASTATIN CALCIUM 20 MG PO TABS: 40.0000 mg | ORAL_TABLET | Freq: Every day | ORAL | 3 refills | Status: DC

## 2022-02-03 NOTE — Assessment & Plan Note (Addendum)
Status post DES to the LAD in May 2023.  As noted, he has had chest discomfort recently.  Proceed with Lexiscan Myoview to rule out ischemia.  Chest x-ray as noted. Continue aspirin 81 mg daily, Plavix 75 mg daily, Zetia 10 mg daily, Crestor 40 mg daily.

## 2022-02-03 NOTE — Assessment & Plan Note (Addendum)
He notes chest discomfort from time to time.  This is not really exertional.  He had minimal symptoms prior to his stent and is concerned that he may not recognize anginal symptoms.  Electrocardiogram today demonstrates no acute changes.  He essentially had single-vessel disease at his cardiac catheterization.  He also notes a pulling sensation in his chest when he abducts his arms.  He is concerned about his pacemaker.  He has also had some palpitations recently that he suspects are atrial fibrillation. Arrange Lexiscan Myoview Arrange chest x-ray Arrange follow-up remote at device check Follow-up 6 months or sooner if testing abnormal

## 2022-02-03 NOTE — Assessment & Plan Note (Addendum)
He has not been placed on anticoagulation secondary to low stroke risk.  He has had palpitations recently.  He is concerned he has had atrial fibrillation.  We will arrange follow-up remote device check.

## 2022-02-03 NOTE — Assessment & Plan Note (Signed)
LDL optimal on most recent lab work.  Continue current Rx with Crestor 40 mg daily, Zetia 10 mg daily..   
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
## 2022-02-03 NOTE — Assessment & Plan Note (Signed)
He had mild diastolic dysfunction on previous echocardiogram.  No evidence of volume excess on exam today.  Obtain BMET, BNP, chest x-ray.

## 2022-02-03 NOTE — Patient Instructions (Signed)
Medication Instructions:  Your physician recommends that you continue on your current medications as directed. Please refer to the Current Medication list given to you today.  *If you need a refill on your cardiac medications before your next appointment, please call your pharmacy*   Lab Work: TODAY:  BMET & PRO BNP  If you have labs (blood work) drawn today and your tests are completely normal, you will receive your results only by: MyChart Message (if you have MyChart) OR A paper copy in the mail If you have any lab test that is abnormal or we need to change your treatment, we will call you to review the results.   Testing/Procedures: A chest x-ray takes a picture of the organs and structures inside the chest, including the heart, lungs, and blood vessels. This test can show several things, including, whether the heart is enlarges; whether fluid is building up in the lungs; and whether pacemaker / defibrillator leads are still in place.   Go to [facility], TODAY [address] [phone]  Your physician has requested that you have a lexiscan myoview. For further information please visit [email or web address] Please follow instruction sheet, BELOW:    You are scheduled for a Myocardial Perfusion Imaging  Please arrive 15 minutes prior to your appointment time for registration and insurance purposes.  The test will take approximately 3 to 4 hours to complete; you may bring reading material.  If someone comes with you to your appointment, they will need to remain in the main lobby due to limited space in the testing area. **If you are pregnant or breastfeeding, please notify the nuclear lab prior to your appointment**  How to prepare for your Myocardial Perfusion Test: Do not eat or drink 3 hours prior to your test, except you may have water. Do not consume products containing caffeine (regular or decaffeinated) 12 hours prior to your test. (ex: coffee,  chocolate, sodas, tea). Do bring a list of your current medications with you.  If not listed below, you may take your medications as normal. Do wear comfortable clothes (no dresses or overalls) and walking shoes, tennis shoes preferred (No heels or open toe shoes are allowed). Do NOT wear cologne, perfume, aftershave, or lotions (deodorant is allowed). If these instructions are not followed, your test will have to be rescheduled.     Follow-Up: At [facility], you and your health needs are our priority.  As part of our continuing mission to provide you with exceptional heart care, we have created designated Provider Care Teams.  These Care Teams include your primary Cardiologist (physician) and Advanced Practice Providers (APPs -  Physician Assistants and Nurse Practitioners) who all work together to provide you with the care you need, when you need it.  We recommend signing up for the patient portal called "MyChart".  Sign up information is provided on this After Visit Summary.  MyChart is used to connect with patients for Virtual Visits (Telemedicine).  Patients are able to view lab/test results, encounter notes, upcoming appointments, etc.  Non-urgent messages can be sent to your provider as well.   To learn more about what you can do with MyChart, go to [email or web address].    Your next appointment:   6 month(s)  The format for your next appointment:   In Person  Provider:   [name], MD     Other Instructions   Important Information About Sugar

## 2022-02-04 LAB — BASIC METABOLIC PANEL
BUN/Creatinine Ratio: 17 (ref 10–24)
BUN: 18 mg/dL (ref 8–27)
CO2: 26 mmol/L (ref 20–29)
Calcium: 9.9 mg/dL (ref 8.6–10.2)
Chloride: 102 mmol/L (ref 96–106)
Creatinine, Ser: 1.06 mg/dL (ref 0.76–1.27)
Glucose: 107 mg/dL — ABNORMAL HIGH (ref 70–99)
Potassium: 4.7 mmol/L (ref 3.5–5.2)
Sodium: 141 mmol/L (ref 134–144)
eGFR: 80 mL/min/{1.73_m2} (ref >59)

## 2022-02-04 LAB — PRO B NATRIURETIC PEPTIDE: NT-Pro BNP: <36 pg/mL (ref 0–210)

## 2022-02-09 ENCOUNTER — Ambulatory Visit (HOSPITAL_COMMUNITY): Payer: BC Managed Care – PPO | Attending: Cardiology

## 2022-02-09 DIAGNOSIS — Z950 Presence of cardiac pacemaker: Secondary | ICD-10-CM | POA: Diagnosis present

## 2022-02-09 DIAGNOSIS — I480 Paroxysmal atrial fibrillation: Secondary | ICD-10-CM

## 2022-02-09 DIAGNOSIS — I2510 Atherosclerotic heart disease of native coronary artery without angina pectoris: Secondary | ICD-10-CM | POA: Diagnosis present

## 2022-02-09 DIAGNOSIS — R072 Precordial pain: Secondary | ICD-10-CM

## 2022-02-09 DIAGNOSIS — E785 Hyperlipidemia, unspecified: Secondary | ICD-10-CM

## 2022-02-09 LAB — MYOCARDIAL PERFUSION IMAGING
LV dias vol: 63 mL (ref 62–150)
LV sys vol: 24 mL
Nuc Stress EF: 61 %
Peak HR: 114 {beats}/min
Rest HR: 72 {beats}/min
Rest Nuclear Isotope Dose: 10.7 mCi
SDS: 0
SRS: 0
SSS: 0
ST Depression (mm): 0 mm
Stress Nuclear Isotope Dose: 31.4 mCi
TID: 1.06

## 2022-02-09 MED ORDER — TECHNETIUM TC 99M TETROFOSMIN IV KIT
10.7000 | PACK | Freq: Once | INTRAVENOUS | Status: AC | PRN
  Administered 2022-02-09: 10.7 via INTRAVENOUS

## 2022-02-09 MED ORDER — REGADENOSON 0.4 MG/5ML IV SOLN
0.4000 mg | Freq: Once | INTRAVENOUS | Status: AC
  Administered 2022-02-09: 0.4 mg via INTRAVENOUS

## 2022-02-09 MED ORDER — TECHNETIUM TC 99M TETROFOSMIN IV KIT
31.4000 | PACK | Freq: Once | INTRAVENOUS | Status: AC | PRN
  Administered 2022-02-09: 31.4 via INTRAVENOUS

## 2022-02-16 NOTE — Progress Notes (Signed)
Pt has been made aware of normal result and verbalized understanding.  jw

## 2022-02-22 NOTE — Telephone Encounter (Deleted)
Dr. Johney Frame Can you help with explaining LV diastolic volume results from a nuclear stress test to Mr. Morillo? Thanks, [employer], [place]    02/22/2022 10:29 AM

## 2022-02-24 NOTE — Progress Notes (Signed)
ACUTE VISIT Chief Complaint  Patient presents with   Hyperglycemia   HPI: [name] is a 62 y.o. male with past medical history significant for CAD, OSA on CPAP, atrial fibrillation, hyperlipidemia, vitamin D deficiency,NSVTstatus post pacemaker placement, and prediabetes. Here today concerned about elevated glucose seen on recent blood work. He is following with cardiologist regularly and has fasting labs on 02/04/22. Glucose was elevated at 107. He reports limiting sugar intake in his diet.  HgA1C was 5.9 in 04/2021.  Negative for polydipsia,polyuria, or polyphagia.  HLD on Rosuvastatin 20 mg daily. He reports exercising, but not as much as he would like to. He expresses concern about fatty liver and its relation to his metabolism.  Has had abnormal LFTs in the past. Negative for abdominal pain, nausea, jaundice. Liver US done in 04/2021:Hepatic steatosis.  Lab Results  Component Value Date   ALT 46 (H) 01/03/2022   AST 33 09/29/2021   ALKPHOS 87 09/29/2021   BILITOT 0.5 09/29/2021   He is also concerned about not being able to lose weight despite the following a healthful diet consistently. He is not longer following with weight loss clinic.  Neck mass, for which he was seen on 11/15/21. He was initially evaluated in [facility] and treated with oral antibiotics. Neck/soft tissue CT done on 11/23/21 showed enlarged right submandibular gland: 15 mm exophytic and heterogeneous lesion arising from the posterior superior pole of the gland, central cystic or necrotic component, and evidence of some surrounding inflammation. He was referred to an ENT specialist in October 2023 but has not been contacted for an appointment.  He is reporting decreased in gland size. Negative for sore throat, dysphagia, cough, wheezing, or stridor. He sees a cardiologist every two months and recently underwent a stress test due to chest discomfort, which according to patient, yielded good  results. Lexiscan stress test on 02/09/22: Fixed inferior perfusion defect with normal wall motion, consistent with artifact Low risk study  He is also complaining of left lower eyelid mild edema, erythema, and tenderness. Negative for conjunctival erythema or drainage.  No history of trauma or visual changes.  Review of Systems  Constitutional:  Negative for activity change, appetite change and fever.  Respiratory:  Negative for shortness of breath and wheezing.   Cardiovascular:  Negative for chest pain, palpitations and leg swelling.  Gastrointestinal:  Negative for abdominal pain, nausea and vomiting.  Genitourinary:  Negative for decreased urine volume, dysuria and hematuria.  Neurological:  Negative for syncope, weakness and headaches.  See other pertinent positives and negatives in HPI.  Current Outpatient Medications on File Prior to Visit  Medication Sig Dispense Refill   acetaminophen (TYLENOL) 500 MG tablet Take 500-1,000 mg by mouth every 6 (six) hours as needed (pain.).     aspirin EC 81 MG tablet Take 1 tablet (81 mg total) by mouth daily. 90 tablet 3   aspirin-acetaminophen-caffeine (EXCEDRIN MIGRAINE) 250-250-65 MG tablet Take 1-2 tablets by mouth 2 (two) times daily as needed for headache.     clopidogrel (PLAVIX) 75 MG tablet Take 1 tablet (75 mg total) by mouth daily. 90 tablet 3   diltiazem (CARDIZEM) 30 MG tablet Take 1 tablet (30 mg total) by mouth 4 (four) times daily as needed. 30 tablet 3   ezetimibe (ZETIA) 10 MG tablet Take 1 tablet (10 mg total) by mouth daily. 90 tablet 3   ibuprofen (ADVIL) 200 MG tablet Take 400-600 mg by mouth every 8 (eight) hours as needed (pain.).  Multiple Vitamins-Minerals (MULTI FOR HIM) TABS Take 1 tablet by mouth in the morning.     Polyethyl Glycol-Propyl Glycol (LUBRICANT EYE DROPS) 0.4-0.3 % SOLN Place 1-2 drops into both eyes 3 (three) times daily as needed (dry/irritated eyes.).     rosuvastatin (CRESTOR) 20 MG tablet Take 2  tablets (40 mg total) by mouth daily. 180 tablet 3   nitroGLYCERIN (NITROSTAT) 0.4 MG SL tablet Place 1 tablet (0.4 mg total) under the tongue every 5 (five) minutes as needed for chest pain. 90 tablet 3   No current facility-administered medications on file prior to visit.    Past Medical History:  Diagnosis Date   CAD (coronary artery disease) 07/13/2021   CCTA 06/2021: CAC score 260 (84th percentile); mLAD 70-99; mLAD FFR pos (0.60) Status post DES to the mid LAD 06/2021   Carotid stenosis 08/11/2021   Carotid US 07/2021: Bilat ICA 1-39   Deafness in right ear    Family history of pancreatic cancer    Family history of prostate cancer    Family history of skin cancer    Hyperlipidemia LDL goal <70 08/20/2018   PAF (paroxysmal atrial fibrillation) (HCC) 05/06/2021   Sleep apnea    Allergies  Allergen Reactions   Bee Venom Anaphylaxis    Social History   Socioeconomic History   Marital status: Married    Spouse name: Brandon Madden   Number of children: 4   Years of education: Law   Highest education level: Professional school degree (e.g., MD, DDS, DVM, JD)  Occupational History   Occupation: [employer]    Comment: [employer]  Tobacco Use   Smoking status: Never   Smokeless tobacco: Never  Vaping Use   Vaping Use: Never used  Substance and Sexual Activity   Alcohol use: No    Comment: quit: 2011   Drug use: No   Sexual activity: Yes  Other Topics Concern   Not on file  Social History Narrative   Right handed   Patient lives at home with his family.   Caffeine Use: 2 cups daily   Social Determinants of Health   Financial Resource Strain: Not on file  Food Insecurity: Not on file  Transportation Needs: Not on file  Physical Activity: Not on file  Stress: Not on file  Social Connections: Not on file   Today's Vitals   02/27/22 1127  BP: 118/70  Pulse: 74  Resp: 16  Temp: 98.3 F (36.8 C)  TempSrc: Oral  SpO2: 99%  Weight: 197 lb (89.4 kg)  Height: 5\' 6"  (1.676 m)    Wt Readings from Last 3 Encounters:  02/27/22 197 lb (89.4 kg)  02/09/22 193 lb (87.5 kg)  02/03/22 193 lb 12.8 oz (87.9 kg)   Body mass index is 31.8 kg/m.  Physical Exam Vitals and nursing note reviewed.  Constitutional:      General: He is not in acute distress.    Appearance: He is well-developed.  HENT:     Head: Normocephalic and atraumatic.  Eyes:     General:        Left eye: Hordeolum present.No discharge.     Conjunctiva/sclera: Conjunctivae normal.  Neck:     Thyroid: No thyroid mass.  Cardiovascular:     Rate and Rhythm: Normal rate and regular rhythm.     Pulses:          Dorsalis pedis pulses are 2+ on the right side and 2+ on the left side.     Heart  sounds: No murmur heard. Pulmonary:     Effort: Pulmonary effort is normal. No respiratory distress.     Breath sounds: Normal breath sounds.  Abdominal:     Palpations: Abdomen is soft. There is no hepatomegaly or mass.     Tenderness: There is no abdominal tenderness.  Musculoskeletal:     Cervical back: No edema or erythema.  Lymphadenopathy:     Head:     Right side of head: No submandibular adenopathy.     Left side of head: No submandibular adenopathy.     Cervical: No cervical adenopathy.  Skin:    General: Skin is warm.     Findings: No erythema or rash.  Neurological:     Mental Status: He is alert and oriented to person, place, and time.     Cranial Nerves: No cranial nerve deficit.     Gait: Gait normal.  Psychiatric:        Mood and Affect: Affect normal. Mood is anxious.   ASSESSMENT AND PLAN:  [name] was seen today for hyperglycemia.  Diagnoses and all orders for this visit: Lab Results  Component Value Date   HGBA1C 5.4 02/27/2022   Prediabetes Assessment & Plan: Hemoglobin A1c today 5.4 (5.9). Continue avoiding sugar containing foods, encouraged to increase physical activity for diabetes prevention. We discussed some side effects of statin medications as well as CV  benefits.  Orders: -     POCT glycosylated hemoglobin (Hb A1C)  Class 1 obesity with serious comorbidity and body mass index (BMI) of 31.0 to 31.9 in adult, unspecified obesity type Assessment & Plan: Frustrated because she has not been able to lose weight despite of dietary changes. He understands the benefits of wt loss as well as adverse effects of obesity. Consistency with healthy diet and physical activity encouraged. Referral to medical weight management placed.   Orders: -     Amb Ref to Medical Weight Management  Neck mass Assessment & Plan: Referral to ENT was placed in 11/2021, he did not receive a call with appointment information. Problem has greatly improved. We will check on referral status and arrange appointment even though problem seems to be resolved.    Hepatic steatosis Assessment & Plan: We discussed diagnosis, prognosis, and treatment options. His ALT was mildly elevated in 12/2021, his cardiologist has plan on repeating LFTs in 03/2022. Continue following a healthful diet, limiting fat and sugar intake. We could consider repeating liver US if problem gets worse as well as GI evaluation.   Hordeolum of left lower eyelid, unspecified hordeolum type Assessment & Plan: Educated about diagnosis, prognosis, and treatment options. Recommend local heat a few times throughout the day. Monitor for new symptoms.   PAF (paroxysmal atrial fibrillation) (HCC) Assessment & Plan: Rhythm and rate control today. On diltiazem 30 mg every 6 hours as needed Following with cardiologist.   Coronary artery disease involving native coronary artery of native heart with angina pectoris [facility]) Assessment & Plan: Status post DES to the mid LAD in 06/2021. Recent stress test 02/09/2022 was normal. On rosuvastatin 40 mg daily, Zetia 10 mg daily,Plavix 75 mg daily, and Aspirin 81 mg daily. Following with cardiologist.   I spent a total of 46 minutes in both face to face and  non face to face activities for this visit on the date of this encounter. During this time history was obtained and documented, examination was performed, prior labs/imaging reviewed, and assessment/plan discussed. Return if symptoms worsen or fail to improve, for  keep next appointment.  Kemonte Ullman G. [place], MD  [facility]. [place] office.

## 2022-02-27 ENCOUNTER — Ambulatory Visit: Payer: No Typology Code available for payment source | Admitting: Family Medicine

## 2022-02-27 ENCOUNTER — Encounter: Payer: Self-pay | Admitting: Family Medicine

## 2022-02-27 VITALS — BP 118/70 | HR 74 | Temp 98.3°F | Resp 16 | Ht 66.0 in | Wt 197.0 lb

## 2022-02-27 DIAGNOSIS — R7303 Prediabetes: Secondary | ICD-10-CM | POA: Insufficient documentation

## 2022-02-27 DIAGNOSIS — E669 Obesity, unspecified: Secondary | ICD-10-CM | POA: Diagnosis not present

## 2022-02-27 DIAGNOSIS — K760 Fatty (change of) liver, not elsewhere classified: Secondary | ICD-10-CM | POA: Diagnosis not present

## 2022-02-27 DIAGNOSIS — R221 Localized swelling, mass and lump, neck: Secondary | ICD-10-CM

## 2022-02-27 DIAGNOSIS — I480 Paroxysmal atrial fibrillation: Secondary | ICD-10-CM

## 2022-02-27 DIAGNOSIS — Z6831 Body mass index (BMI) 31.0-31.9, adult: Secondary | ICD-10-CM

## 2022-02-27 DIAGNOSIS — I25119 Atherosclerotic heart disease of native coronary artery with unspecified angina pectoris: Secondary | ICD-10-CM

## 2022-02-27 DIAGNOSIS — H00015 Hordeolum externum left lower eyelid: Secondary | ICD-10-CM | POA: Insufficient documentation

## 2022-02-27 LAB — POCT GLYCOSYLATED HEMOGLOBIN (HGB A1C): Hemoglobin A1C: 5.4 % (ref 4.0–5.6)

## 2022-02-27 NOTE — Assessment & Plan Note (Signed)
Referral to ENT was placed in 11/2021, he did not receive a call with appointment information. Problem has greatly improved. We will check on referral status and arrange appointment even though problem seems to be resolved.

## 2022-02-27 NOTE — Assessment & Plan Note (Signed)
Rhythm and rate control today. On diltiazem 30 mg every 6 hours as needed Following with cardiologist.

## 2022-02-27 NOTE — Assessment & Plan Note (Signed)
We discussed diagnosis, prognosis, and treatment options. His ALT was mildly elevated in 12/2021, his cardiologist has plan on repeating LFTs in 03/2022. Continue following a healthful diet, limiting fat and sugar intake. We could consider repeating liver US if problem gets worse as well as GI evaluation.

## 2022-02-27 NOTE — Assessment & Plan Note (Signed)
Educated about diagnosis, prognosis, and treatment options. Recommend local heat a few times throughout the day. Monitor for new symptoms.

## 2022-02-27 NOTE — Assessment & Plan Note (Addendum)
Status post DES to the mid LAD in 06/2021. Recent stress test 02/09/2022 was normal. On rosuvastatin 40 mg daily, Zetia 10 mg daily,Plavix 75 mg daily, and Aspirin 81 mg daily. Following with cardiologist.

## 2022-02-27 NOTE — Addendum Note (Signed)
Addended by: Rodrigo Ran on: 02/27/2022 01:49 PM   Modules accepted: Orders

## 2022-02-27 NOTE — Assessment & Plan Note (Signed)
Frustrated because she has not been able to lose weight despite of dietary changes. He understands the benefits of wt loss as well as adverse effects of obesity. Consistency with healthy diet and physical activity encouraged. Referral to medical weight management placed.

## 2022-02-27 NOTE — Patient Instructions (Addendum)
A few things to remember from today's visit:  Elevated glucose - Plan: POC HgB A1c  Class 1 obesity with serious comorbidity and body mass index (BMI) of 31.0 to 31.9 in adult, unspecified obesity type - Plan: Amb Ref to Medical Weight Management  Neck mass  Hepatic steatosis We will try to finding out status of referral. You have labs scheduled by cardiologist.  If you need refills for medications you take chronically, please call your pharmacy. Do not use My Chart to request refills or for acute issues that need immediate attention. If you send a my chart message, it may take a few days to be addressed, specially if I am not in the office.  Please be sure medication list is accurate. If a new problem present, please set up appointment sooner than planned today.

## 2022-02-27 NOTE — Assessment & Plan Note (Signed)
Hemoglobin A1c today 5.4 (5.9). Continue avoiding sugar containing foods, encouraged to increase physical activity for diabetes prevention. We discussed some side effects of statin medications as well as CV benefits.

## 2022-03-03 ENCOUNTER — Ambulatory Visit (INDEPENDENT_AMBULATORY_CARE_PROVIDER_SITE_OTHER): Payer: BC Managed Care – PPO

## 2022-03-03 DIAGNOSIS — I480 Paroxysmal atrial fibrillation: Secondary | ICD-10-CM

## 2022-03-03 LAB — CUP PACEART REMOTE DEVICE CHECK
Battery Remaining Longevity: 172 mo
Battery Voltage: 3.16 V
Brady Statistic AP VP Percent: 7.38 %
Brady Statistic AP VS Percent: 5.82 %
Brady Statistic AS VP Percent: 0.06 %
Brady Statistic AS VS Percent: 86.74 %
Brady Statistic RA Percent Paced: 13.18 %
Brady Statistic RV Percent Paced: 7.44 %
Date Time Interrogation Session: 20240112051612
Implantable Lead Connection Status: 753985
Implantable Lead Connection Status: 753985
Implantable Lead Implant Date: 20230713
Implantable Lead Implant Date: 20230713
Implantable Lead Location: 753859
Implantable Lead Location: 753860
Implantable Lead Model: 3830
Implantable Lead Model: 5076
Implantable Pulse Generator Implant Date: 20230713
Lead Channel Impedance Value: 361 Ohm
Lead Channel Impedance Value: 418 Ohm
Lead Channel Impedance Value: 475 Ohm
Lead Channel Impedance Value: 513 Ohm
Lead Channel Pacing Threshold Amplitude: 0.5 V
Lead Channel Pacing Threshold Amplitude: 0.875 V
Lead Channel Pacing Threshold Pulse Width: 0.4 ms
Lead Channel Pacing Threshold Pulse Width: 0.4 ms
Lead Channel Sensing Intrinsic Amplitude: 13.5 mV
Lead Channel Sensing Intrinsic Amplitude: 13.5 mV
Lead Channel Sensing Intrinsic Amplitude: 2.75 mV
Lead Channel Sensing Intrinsic Amplitude: 2.75 mV
Lead Channel Setting Pacing Amplitude: 1.5 V
Lead Channel Setting Pacing Amplitude: 2 V
Lead Channel Setting Pacing Pulse Width: 0.4 ms
Lead Channel Setting Sensing Sensitivity: 1.2 mV
Zone Setting Status: 755011

## 2022-03-07 ENCOUNTER — Encounter: Payer: Self-pay | Admitting: Bariatrics

## 2022-03-07 ENCOUNTER — Encounter: Payer: No Typology Code available for payment source | Admitting: Bariatrics

## 2022-03-07 ENCOUNTER — Ambulatory Visit: Payer: No Typology Code available for payment source | Admitting: Bariatrics

## 2022-03-07 VITALS — BP 132/89 | HR 83 | Temp 97.8°F | Ht 66.0 in | Wt 188.0 lb

## 2022-03-07 DIAGNOSIS — R5383 Other fatigue: Secondary | ICD-10-CM | POA: Diagnosis not present

## 2022-03-07 DIAGNOSIS — E65 Localized adiposity: Secondary | ICD-10-CM | POA: Insufficient documentation

## 2022-03-07 DIAGNOSIS — Z0289 Encounter for other administrative examinations: Secondary | ICD-10-CM

## 2022-03-07 DIAGNOSIS — E668 Other obesity: Secondary | ICD-10-CM

## 2022-03-07 DIAGNOSIS — E7800 Pure hypercholesterolemia, unspecified: Secondary | ICD-10-CM | POA: Diagnosis not present

## 2022-03-07 DIAGNOSIS — Z6830 Body mass index (BMI) 30.0-30.9, adult: Secondary | ICD-10-CM

## 2022-03-07 DIAGNOSIS — E559 Vitamin D deficiency, unspecified: Secondary | ICD-10-CM

## 2022-03-07 DIAGNOSIS — E669 Obesity, unspecified: Secondary | ICD-10-CM | POA: Insufficient documentation

## 2022-03-15 NOTE — Progress Notes (Unsigned)
Office: [phone]  /  Fax: [phone]   Initial Visit  Brandon Madden was seen in clinic today to evaluate for obesity. He is interested in losing weight to improve overall health and reduce the risk of weight related complications. He presents today to review program treatment options, initial physical assessment, and evaluation.     He was referred by: PCP  When asked what else they would like to accomplish? He states: Adopt healthier eating patterns, Improve existing medical conditions, Reduce number of medications, Reduce risk for a surgery, and Other: Manage stress and weight fluctuations  When asked how has your weight affected you? He states: Contributed to medical problems and Problems with eating patterns  Some associated conditions: Fatty liver disease  Contributing factors: Family history, Nutritional, and Reduced physical activity  Weight promoting medications identified: None  Current nutrition plan: Portion control / smart choices  Current level of physical activity: None, Walking, and NEAT  Current or previous pharmacotherapy: None  Response to medication: Never tried medications   Past medical history includes:   Past Medical History:  Diagnosis Date   CAD (coronary artery disease) 07/13/2021   CCTA 06/2021: CAC score 260 (84th percentile); mLAD 70-99; mLAD FFR pos (0.60) Status post DES to the mid LAD 06/2021   Carotid stenosis 08/11/2021   Carotid US 07/2021: Bilat ICA 1-39   Deafness in right ear    Family history of pancreatic cancer    Family history of prostate cancer    Family history of skin cancer    Hyperlipidemia LDL goal <70 08/20/2018   PAF (paroxysmal atrial fibrillation) ([facility]) 05/06/2021   Sleep apnea      Objective:   BP 132/89   Pulse 83   Temp 97.8 F (36.6 C)   Ht 5\' 6"  (1.676 m)   Wt 188 lb (85.3 kg)   SpO2 99%   BMI 30.34 kg/m  He was weighed on the bioimpedance scale: Body mass index is 30.34 kg/m.  Peak Weight: 205 lbs  ,Visceral Fat Rating:16, Body Fat%:29.7.  General:  Alert, oriented and cooperative. Patient is in no acute distress.  Respiratory: Normal respiratory effort, no problems with respiration noted  Extremities: Normal range of motion.    Mental Status: Normal mood and affect. Normal behavior. Normal judgment and thought content.   Assessment and Plan:  1. Other fatigue Patient notes fatigue with certain activities, fairly every day.  EKG and IC will be obtained at his next visit.  - EKG 12-Lead; Future  2. Vitamin D deficiency Patient's last vitamin D level was 26.15.  He will continue OTC vitamin D.  3. Elevated cholesterol Patient has been taking Zetia for 4 months, and Crestor for 8 years.  His last LDL was 170, and total cholesterol 245.  He will continue his medications, eliminate trans fats, and minimize saturated fats.  4. Visceral obesity Patient's visceral fat percentage is 16.  His goal visceral fat is 13 or below.  5. Obesity, Current BMI 30.5 Patient will return to our clinic in 2 weeks for his initial visit.   We reviewed weight, biometrics, associated medical conditions and contributing factors with patient. He would benefit from weight loss therapy via a modified calorie, low-carb, high-protein nutritional plan tailored to their REE (resting energy expenditure) which will be determined by indirect calorimetry.  We will also assess for cardiometabolic risk and nutritional derangements via fasting serologies at his next appointment.      Obesity Treatment / Action Plan:  Patient will  work on garnering support from family and friends to begin weight loss journey. Will work on eliminating or reducing the presence of highly palatable, calorie dense foods in the home. Will complete provided nutritional and psychosocial assessment questionnaire before the next appointment. Will be scheduled for indirect calorimetry to determine resting energy expenditure in a fasting  state.  This will allow [place] to create a reduced calorie, high-protein meal plan to promote loss of fat mass while preserving muscle mass. Will think about ideas on how to incorporate physical activity into their daily routine. Will work on reducing intake of added sugars, simple sugars and processed carbs. Will avoid skipping meals which may result in increased hunger signals and overeating at certain times. Will work on reading labels, making healthier choices and watching portion sizes. Was counseled on nutritional approaches to weight loss and benefits of complex carbs and high quality protein as part of nutritional weight management. Will work on increasing water intake with a goal of 125 ounces for men and 91 ounces for women.   Obesity Education Performed Today:  He was weighed on the bioimpedance scale and results were discussed and documented in the synopsis.  We discussed obesity as a disease and the importance of a more detailed evaluation of all the factors contributing to the disease.  We discussed the importance of long term lifestyle changes which include nutrition, exercise and behavioral modifications as well as the importance of customizing this to his specific health and social needs.  We discussed the benefits of reaching a healthier weight to alleviate the symptoms of existing conditions and reduce the risks of the biomechanical, metabolic and psychological effects of obesity.  Crew Goren appears to be in the action stage of change and states they are ready to start intensive lifestyle modifications and behavioral modifications.   Reviewed by clinician on day of visit: allergies, medications, problem list, medical history, surgical history, family history, social history, and previous encounter notes.   Wilhemena Durie, am acting as transcriptionist for [employer], DO   I have reviewed the above documentation for accuracy and completeness, and I agree with the above.  Jearld Lesch, DO

## 2022-03-16 ENCOUNTER — Encounter: Payer: Self-pay | Admitting: Bariatrics

## 2022-03-20 ENCOUNTER — Encounter: Payer: Self-pay | Admitting: Bariatrics

## 2022-03-20 ENCOUNTER — Ambulatory Visit: Payer: No Typology Code available for payment source | Admitting: Bariatrics

## 2022-03-20 VITALS — BP 129/82 | HR 77 | Temp 97.7°F | Ht 66.0 in | Wt 187.0 lb

## 2022-03-20 DIAGNOSIS — R7303 Prediabetes: Secondary | ICD-10-CM

## 2022-03-20 DIAGNOSIS — F509 Eating disorder, unspecified: Secondary | ICD-10-CM | POA: Insufficient documentation

## 2022-03-20 DIAGNOSIS — Z6830 Body mass index (BMI) 30.0-30.9, adult: Secondary | ICD-10-CM

## 2022-03-20 DIAGNOSIS — E7849 Other hyperlipidemia: Secondary | ICD-10-CM

## 2022-03-20 DIAGNOSIS — E782 Mixed hyperlipidemia: Secondary | ICD-10-CM | POA: Insufficient documentation

## 2022-03-20 DIAGNOSIS — R5383 Other fatigue: Secondary | ICD-10-CM

## 2022-03-20 DIAGNOSIS — E559 Vitamin D deficiency, unspecified: Secondary | ICD-10-CM

## 2022-03-20 DIAGNOSIS — E669 Obesity, unspecified: Secondary | ICD-10-CM

## 2022-03-20 DIAGNOSIS — Z1331 Encounter for screening for depression: Secondary | ICD-10-CM | POA: Diagnosis not present

## 2022-03-20 DIAGNOSIS — I480 Paroxysmal atrial fibrillation: Secondary | ICD-10-CM | POA: Diagnosis not present

## 2022-03-20 DIAGNOSIS — F5089 Other specified eating disorder: Secondary | ICD-10-CM

## 2022-03-20 DIAGNOSIS — E65 Localized adiposity: Secondary | ICD-10-CM

## 2022-03-20 DIAGNOSIS — Z0000 Encounter for general adult medical examination without abnormal findings: Secondary | ICD-10-CM | POA: Insufficient documentation

## 2022-03-20 DIAGNOSIS — K760 Fatty (change of) liver, not elsewhere classified: Secondary | ICD-10-CM

## 2022-03-20 DIAGNOSIS — R0602 Shortness of breath: Secondary | ICD-10-CM

## 2022-03-20 DIAGNOSIS — E785 Hyperlipidemia, unspecified: Secondary | ICD-10-CM

## 2022-03-20 NOTE — Progress Notes (Signed)
Remote pacemaker transmission.   

## 2022-03-20 NOTE — Progress Notes (Signed)
Office: [phone]  /  Fax: [phone]    Date: 04/03/2022   Appointment Start Time: 9:04am Duration: 49 minutes Provider: Glennie Isle, Psy.D. Type of Session: Intake for Individual Therapy  Location of Patient: Parked in car outside [employer] office (safe/private location) Location of Provider: Provider's home (private office) Type of Contact: Telepsychological Visit via [place] Video Visit  Informed Consent: This provider called Lennette Bihari at 9:05am as he presented for the appointment; however, was unable to connect with audio and video capabilities. Assistance was provided. Prior to proceeding with today's appointment, two pieces of identifying information were obtained. In addition, Angelica's physical location at the time of this appointment was obtained as well a phone number he could be reached at in the event of technical difficulties. Lennette Bihari and this provider participated in today's telepsychological service.   The provider's role was explained to Lorina Rabon. The provider reviewed and discussed issues of confidentiality, privacy, and limits therein (e.g., reporting obligations). In addition to verbal informed consent, written informed consent for psychological services was obtained prior to the initial appointment. Since the clinic is not a 24/7 crisis center, mental health emergency resources were shared and this  provider explained MyChart, e-mail, voicemail, and/or other messaging systems should be utilized only for non-emergency reasons. This provider also explained that information obtained during appointments will be placed in [facility] record and relevant information will be shared with other providers at [facility] for coordination of care. Maxell agreed information may be shared with other [facility] providers as needed for coordination of care and by signing the service agreement document, he provided written consent for coordination of  care. Prior to initiating telepsychological services, Kelin completed an informed consent document, which included the development of a safety plan (i.e., an emergency contact and emergency resources) in the event of an emergency/crisis. Avriel verbally acknowledged understanding he is ultimately responsible for understanding his insurance benefits for telepsychological and in-person services. This provider also reviewed confidentiality, as it relates to telepsychological services. Dent  acknowledged understanding that appointments cannot be recorded without both party consent and he is aware he is responsible for securing confidentiality on his end of the session. Azad verbally consented to proceed.  Chief Complaint/HPI: Antwine was referred by Dr. Jearld Lesch due to "other disorder of eating" on March 20, 2022.  During today's appointment, Franz reported experiencing ongoing work stress, which is "impactful on [his] eating habits." He also shared about ongoing family stress. He was verbally administered a questionnaire assessing various behaviors related to emotional eating behaviors. Marcelus endorsed the following: eat certain foods when you are anxious, stressed, depressed, or your feelings are hurt, use food to help you cope with emotional situations, find food is comforting to you, overeat when you are angry or upset, and overeat when you are worried about something. Tjaden believes the onset of emotional eating behaviors was likely when he started having his youngest three children. He described the current frequency of emotional eating behaviors as "few times a week." In addition, Inez denied a history of binge eating behaviors. Kealan denied a history of significantly restricting food intake, purging and engagement in other compensatory strategies, and has never been diagnosed with an eating disorder. He also denied a history of treatment for emotional eating behaviors. Currently, Neerav indicated he is  implementing Dr. Saul Fordyce recommendations.  Mental Status Examination:  Appearance: neat Behavior: appropriate to circumstances Mood: neutral Affect: mood congruent Speech: WNL Eye Contact: intermittent Psychomotor Activity:  WNL Gait: unable to assess  Thought Process: linear, logical, and goal directed and denies suicidal, homicidal, and self-harm ideation, plan and intent  Thought Content/Perception: no hallucinations, delusions, bizarre thinking or behavior endorsed or observed Orientation: AAOx4 Memory/Concentration: memory, attention, language, and fund of knowledge intact  Insight/Judgment: fair  Family & Psychosocial History: Kentarius reported he is married and he has four children (47, 65, 12, and 2). He indicated he is currently employed with [employer]. Additionally, [name] shared his highest level of education obtained is JD degree. Currently, Trey's social support system consists of his wife, friends, and other family members. Moreover, Matheo stated he resides with his wife, youngest three children, and a dog.   Medical History:  Past Medical History:  Diagnosis Date   CAD (coronary artery disease) 07/13/2021   CCTA 06/2021: CAC score 260 (84th percentile); mLAD 70-99; mLAD FFR pos (0.60) Status post DES to the mid LAD 06/2021   Carotid stenosis 08/11/2021   Carotid US 07/2021: Bilat ICA 1-39   CHF (congestive heart failure) ([facility])    Deafness in right ear    Family history of pancreatic cancer    Family history of prostate cancer    Family history of skin cancer    Fatty liver    High cholesterol    Hyperlipidemia LDL goal <70 08/20/2018   PAF (paroxysmal atrial fibrillation) ([facility]) 05/06/2021   Pre-diabetes    Sleep apnea    Vitamin B 12 deficiency    Vitamin D deficiency    Past Surgical History:  Procedure Laterality Date   CARDIAC CATHETERIZATION     CORONARY STENT INTERVENTION N/A 07/15/2021   Procedure: CORONARY STENT INTERVENTION;  Surgeon: Belva Crome, MD;   Location: [facility];  Service: Cardiovascular;  Laterality: N/A;   INSERT / REPLACE / REMOVE PACEMAKER     INTRAVASCULAR ULTRASOUND/IVUS N/A 07/15/2021   Procedure: Intravascular Ultrasound/IVUS;  Surgeon: Belva Crome, MD;  Location: [facility];  Service: Cardiovascular;  Laterality: N/A;   LEFT HEART CATH AND CORONARY ANGIOGRAPHY N/A 07/15/2021   Procedure: LEFT HEART CATH AND CORONARY ANGIOGRAPHY;  Surgeon: Belva Crome, MD;  Location: [facility];  Service: Cardiovascular;  Laterality: N/A;   LOOP RECORDER REMOVAL N/A 09/01/2021   Procedure: LOOP RECORDER REMOVAL;  Surgeon: Constance Haw, MD;  Location: [facility];  Service: Cardiovascular;  Laterality: N/A;   NASAL SEPTUM SURGERY     PACEMAKER IMPLANT N/A 09/01/2021   Procedure: PACEMAKER IMPLANT;  Surgeon: Constance Haw, MD;  Location: [facility];  Service: Cardiovascular;  Laterality: N/A;   Current Outpatient Medications on File Prior to Visit  Medication Sig Dispense Refill   acetaminophen (TYLENOL) 500 MG tablet Take 500-1,000 mg by mouth every 6 (six) hours as needed (pain.).     aspirin EC 81 MG tablet Take 1 tablet (81 mg total) by mouth daily. 90 tablet 3   aspirin-acetaminophen-caffeine (EXCEDRIN MIGRAINE) 250-250-65 MG tablet Take 1-2 tablets by mouth 2 (two) times daily as needed for headache.     clopidogrel (PLAVIX) 75 MG tablet Take 1 tablet (75 mg total) by mouth daily. 90 tablet 3   diltiazem (CARDIZEM) 30 MG tablet Take 1 tablet (30 mg total) by mouth 4 (four) times daily as needed. 30 tablet 3   ezetimibe (ZETIA) 10 MG tablet Take 1 tablet (10 mg total) by mouth daily. 90 tablet 3   ibuprofen (ADVIL) 200 MG tablet Take 400-600 mg by  mouth every 8 (eight) hours as needed (pain.).     Multiple Vitamins-Minerals (MULTI FOR HIM) TABS Take 1 tablet by mouth in the morning.     nitroGLYCERIN (NITROSTAT) 0.4 MG SL tablet Place 1 tablet (0.4 mg total) under the tongue  every 5 (five) minutes as needed for chest pain. 90 tablet 3   Polyethyl Glycol-Propyl Glycol (LUBRICANT EYE DROPS) 0.4-0.3 % SOLN Place 1-2 drops into both eyes 3 (three) times daily as needed (dry/irritated eyes.).     rosuvastatin (CRESTOR) 20 MG tablet Take 2 tablets (40 mg total) by mouth daily. 180 tablet 3   No current facility-administered medications on file prior to visit.  Corry stated he is medication compliant.   Mental Health History: Rixton reported he attended individual therapeutic services during the divorce process and marriage counseling with his current wife. He denied a history of psychotropic medications. Clois reported there is no history of hospitalizations for psychiatric concerns. Maribel endorsed a family history of mental health-related concerns (sister); however, he shared he is unaware of any diagnoses. Kainen reported there is no history of trauma including psychological, physical , and sexual abuse, as well as neglect. However, he described the divorce process as challenging as his "wife just left" and they went through the custody process.    Levander described his typical mood lately as "good" despite ongoing stressors. He shared his "faith in God is [his] fundamental strength." Thos denied current alcohol use. He denied tobacco use. He denied illicit/recreational substance use. Furthermore, Lennette Bihari indicated he is not experiencing the following: hallucinations and delusions, paranoia, symptoms of mania , social withdrawal, crying spells, panic attacks, memory concerns, attention and concentration issues, and obsessions and compulsions. He also denied history of and current suicidal ideation, plan, and intent; history of and current homicidal ideation, plan, and intent; and history of and current engagement in self-harm.  Legal History: Muhammadyusuf reported there is no history of legal involvement.   Structured Assessments Results: The Patient Health Questionnaire-9 (PHQ-9) is a  self-report measure that assesses symptoms and severity of depression over the course of the last two weeks. Aadan obtained a score of 3 suggesting minimal depression. Shante finds the endorsed symptoms to be not difficult at all. [0= Not at all; 1= Several days; 2= More than half the days; 3= Nearly every day] Little interest or pleasure in doing things 0  Feeling down, depressed, or hopeless 0  Trouble falling or staying asleep, or sleeping too much- diagnosed with sleep apnea 1  Feeling tired or having little energy 1  Poor appetite or overeating 1  Feeling bad about yourself --- or that you are a failure or have let yourself or your family down 0  Trouble concentrating on things, such as reading the newspaper or watching television 0  Moving or speaking so slowly that other people could have noticed? Or the opposite --- being so fidgety or restless that you have been moving around a lot more than usual 0  Thoughts that you would be better off dead or hurting yourself in some way 0  PHQ-9 Score 3    The Generalized Anxiety Disorder-7 (GAD-7) is a brief self-report measure that assesses symptoms of anxiety over the course of the last two weeks. Hadriel obtained a score of 0  [0= Not at all; 1= Several days; 2= Over half the days; 3= Nearly every day] Feeling nervous, anxious, on edge 0  Not being able to stop or control worrying 0  Worrying too  much about different things 0  Trouble relaxing 0  Being so restless that it's hard to sit still 0  Becoming easily annoyed or irritable 0  Feeling afraid as if something awful might happen 0  GAD-7 Score 0   Interventions:  Conducted a chart review Focused on rapport building Verbally administered PHQ-9 and GAD-7 for symptom monitoring Verbally administered Food & Mood questionnaire to assess various behaviors related to emotional eating Provided emphatic reflections and validation Collaborated with patient on a treatment goal  Psychoeducation  provided regarding physical versus emotional hunger  Diagnostic Impressions & Provisional DSM-5 Diagnosis(es): Caylen described the onset of emotional eating behaviors was likely when he had his youngest three children. He described the current frequency as "few times a week" and denied engagement in any other disordered eating behaviors. While he described ongoing stressors, he discussed coping with the support of his faith. Based on the aforementioned, the following diagnosis was assigned: F50.89 Other Specified Feeding or Eating Disorder, Emotional Eating Behaviors.  Plan: [place] appears able and willing to participate as evidenced by collaboration on a treatment goal, engagement in reciprocal conversation, and asking questions as needed for clarification. The next appointment is scheduled for 04/17/2022 at 8:30am, which will be via MyChart Video Visit. The following treatment goal was established: increase coping skills. This provider will regularly review the treatment plan and medical chart to keep informed of status changes. Caden expressed understanding and agreement with the initial treatment plan of care. Aybel will be sent a handout via e-mail to utilize between now and the next appointment to increase awareness of hunger patterns and subsequent eating. Lennette Bihari provided verbal consent during today's appointment for this provider to send the handout via e-mail.

## 2022-03-21 ENCOUNTER — Encounter (INDEPENDENT_AMBULATORY_CARE_PROVIDER_SITE_OTHER): Payer: Self-pay | Admitting: Bariatrics

## 2022-03-21 DIAGNOSIS — E88819 Insulin resistance, unspecified: Secondary | ICD-10-CM | POA: Insufficient documentation

## 2022-03-21 LAB — COMPREHENSIVE METABOLIC PANEL
ALT: 55 IU/L — ABNORMAL HIGH (ref 0–44)
AST: 45 IU/L — ABNORMAL HIGH (ref 0–40)
Albumin/Globulin Ratio: 2.1 (ref 1.2–2.2)
Albumin: 4.9 g/dL (ref 3.8–4.9)
Alkaline Phosphatase: 100 IU/L (ref 44–121)
BUN/Creatinine Ratio: 18 (ref 10–24)
BUN: 16 mg/dL (ref 8–27)
Bilirubin Total: 0.6 mg/dL (ref 0.0–1.2)
CO2: 25 mmol/L (ref 20–29)
Calcium: 9.4 mg/dL (ref 8.6–10.2)
Chloride: 103 mmol/L (ref 96–106)
Creatinine, Ser: 0.91 mg/dL (ref 0.76–1.27)
Globulin, Total: 2.3 g/dL (ref 1.5–4.5)
Glucose: 110 mg/dL — ABNORMAL HIGH (ref 70–99)
Potassium: 4.6 mmol/L (ref 3.5–5.2)
Sodium: 142 mmol/L (ref 134–144)
Total Protein: 7.2 g/dL (ref 6.0–8.5)
eGFR: 96 mL/min/{1.73_m2} (ref >59)

## 2022-03-21 LAB — VITAMIN D 25 HYDROXY (VIT D DEFICIENCY, FRACTURES): Vit D, 25-Hydroxy: 31 ng/mL (ref 30.0–100.0)

## 2022-03-21 LAB — TSH+T4F+T3FREE
Free T4: 1.12 ng/dL (ref 0.82–1.77)
T3, Free: 3.2 pg/mL (ref 2.0–4.4)
TSH: 1.55 u[IU]/mL (ref 0.450–4.500)

## 2022-03-21 LAB — INSULIN, RANDOM: INSULIN: 25.9 u[IU]/mL — ABNORMAL HIGH (ref 2.6–24.9)

## 2022-04-03 ENCOUNTER — Telehealth (INDEPENDENT_AMBULATORY_CARE_PROVIDER_SITE_OTHER): Payer: No Typology Code available for payment source | Admitting: Psychology

## 2022-04-03 ENCOUNTER — Encounter: Payer: Self-pay | Admitting: Bariatrics

## 2022-04-03 ENCOUNTER — Ambulatory Visit: Payer: No Typology Code available for payment source | Admitting: Bariatrics

## 2022-04-03 VITALS — BP 111/74 | HR 75 | Temp 98.1°F | Ht 66.0 in | Wt 192.0 lb

## 2022-04-03 DIAGNOSIS — E669 Obesity, unspecified: Secondary | ICD-10-CM

## 2022-04-03 DIAGNOSIS — R748 Abnormal levels of other serum enzymes: Secondary | ICD-10-CM | POA: Diagnosis not present

## 2022-04-03 DIAGNOSIS — F5089 Other specified eating disorder: Secondary | ICD-10-CM

## 2022-04-03 DIAGNOSIS — E88819 Insulin resistance, unspecified: Secondary | ICD-10-CM | POA: Diagnosis not present

## 2022-04-03 DIAGNOSIS — E559 Vitamin D deficiency, unspecified: Secondary | ICD-10-CM

## 2022-04-03 DIAGNOSIS — Z6831 Body mass index (BMI) 31.0-31.9, adult: Secondary | ICD-10-CM

## 2022-04-05 NOTE — Progress Notes (Unsigned)
Chief Complaint:   OBESITY Brandon Madden (MR# [id number]) is a 62 y.o. male who presents for evaluation and treatment of obesity and related comorbidities. Current BMI is Body mass index is 30.18 kg/m. Brandon Madden has been struggling with his weight for many years and has been unsuccessful in either losing weight, maintaining weight loss, or reaching his healthy weight goal.  Brandon Madden is here for his initial visit. He does like to cook, but he notes time as an obstacle. His goal weight is 165 lbs.   Brandon Madden is currently in the action stage of change and ready to dedicate time achieving and maintaining a healthier weight. Brandon Madden is interested in becoming our patient and working on intensive lifestyle modifications including (but not limited to) diet and exercise for weight loss.  Brandon Madden's habits were reviewed today and are as follows: His family eats meals together, he thinks his family will eat healthier with him, his desired weight loss is 12 lbs, he has been heavy most of his life, he started gaining weight in 2006, his heaviest weight ever was 207 pounds, he has significant food cravings issues, he snacks frequently in the evenings, he skips meals frequently, he is frequently drinking liquids with calories, he frequently eats larger portions than normal, and he struggles with emotional eating.  Depression Screen Brandon Madden's Food and Mood (modified PHQ-9) score was 6.  Subjective:   1. Other fatigue Brandon Madden admits to daytime somnolence and admits to waking up still tired. Patient has a history of symptoms of daytime fatigue and morning fatigue. Brandon Madden generally gets 6 hours of sleep per night, and states that he has nightime awakenings. Snoring is present. Apneic episodes are not present. Epworth Sleepiness Score is 6.   2. SOB (shortness of breath) on exertion Brandon Madden notes increasing shortness of breath with exercising and seems to be worsening over time with weight gain. He notes getting out of breath  sooner with activity than he used to. This has not gotten worse recently. Brandon Madden denies shortness of breath at rest or orthopnea.  3. Other hyperlipidemia Brandon Madden is taking Zetia and Crestor.  His last LDL was 52, not well-controlled.  4. Vitamin D deficiency Brandon Madden's last vitamin D level was 26.15.  He is taking multivitamins.  5. Visceral obesity Brandon Madden's visceral fat percentage is 16.  6. Prediabetes Brandon Madden's last A1c was 5.4 in January 2024.  He is not on medications currently.  7. PAF (paroxysmal atrial fibrillation) (HCC) Brandon Madden's coronary calcium score 260.  He is taking Plavix and Cardizem.  8. Fatty liver Brandon Madden had an ultrasound approximately in 2020.  9. Health care maintenance Given obesity.  10. Other disorder of eating Brandon Madden notes some stress eating.   Assessment/Plan:   1. Other fatigue Brandon Madden does feel that his weight is causing his energy to be lower than it should be. Fatigue may be related to obesity, depression or many other causes. Labs will be ordered, and in the meanwhile, Brandon Madden will focus on self care including making healthy food choices, increasing physical activity and focusing on stress reduction.  - EKG 12-Lead - TSH+T4F+T3Free - Comprehensive metabolic panel  2. SOB (shortness of breath) on exertion Brandon Madden does feel that he gets out of breath more easily that he used to when he exercises. Brandon Madden's shortness of breath appears to be obesity related and exercise induced. He has agreed to work on weight loss and gradually increase exercise to treat his exercise induced shortness of breath. Will continue to monitor closely.  -  [id number] - Comprehensive metabolic panel  3. Other hyperlipidemia We will check labs today.  Brandon Madden will continue with his medications, and eliminate trans fats.  - Comprehensive metabolic panel  4. Vitamin D deficiency We will check labs today.  Brandon Madden will continue with his multivitamins.  - VITAMIN D 25 Hydroxy (Vit-D  Deficiency, Fractures)  5. Visceral obesity We will check labs today.  Goal visceral fat percentage is 13 or below.  - Comprehensive metabolic panel  6. Prediabetes We will check labs today.  Brandon Madden will work on minimizing all carbohydrates (sugar and starches).   - Insulin, random - Comprehensive metabolic panel  7. PAF (paroxysmal atrial fibrillation) (HCC) Brandon Madden will continue with his medications as directed.  8. Fatty liver We will check labs today.  Brandon Madden will work on his meal plan and exercise.  - Comprehensive metabolic panel  9. Health care maintenance We will check labs today. Brandon Madden was done and results were reviewed with the patient.   10. Other disorder of eating Brandon Madden was referred to Dr. Mallie Madden, our Bariatric Psychologist for evaluation.  11. Depression screening Brandon Madden had a positive depression screening. Depression is commonly associated with obesity and often results in emotional eating behaviors. We will monitor this closely and work on CBT to help improve the non-hunger eating patterns. Referral to Psychology may be required if no improvement is seen as he continues in our clinic.  12. Generalized obesity  13. Obesity (BMI 30.0-34.9) Brandon Madden is currently in the action stage of change and his goal is to continue with weight loss efforts. I recommend Brandon Madden begin the structured treatment plan as follows:  He has agreed to the Category 2 Plan.  Meal planning and intentional eating were discussed. Reviewed labs from 02/03/2022 CMP, glucose, and A1c.  Exercise goals: No exercise has been prescribed at this time.   Behavioral modification strategies: increasing lean protein intake, decreasing simple carbohydrates, increasing vegetables, increasing water intake, decreasing eating out, no skipping meals, meal planning and cooking strategies, keeping healthy foods in the home, and planning for success.  He was informed of the importance of frequent follow-up visits to  maximize his success with intensive lifestyle modifications for his multiple health conditions. He was informed we would discuss his lab results at his next visit unless there is a critical issue that needs to be addressed sooner. Brandon Madden agreed to keep his next visit at the agreed upon time to discuss these results.  Objective:   Blood pressure 129/82, pulse 77, temperature 97.7 F (36.5 C), height 5' 6"$  (1.676 m), weight 187 lb (84.8 kg), SpO2 96 %. Body mass index is 30.18 kg/m.  EKG: Normal sinus rhythm, rate (unable to obtain).  Indirect Calorimeter completed today shows a VO2 of 253 and a REE of 1742.  His calculated basal metabolic rate is 123456 thus his basal metabolic rate is worse than expected.  General: Cooperative, alert, well developed, in no acute distress. HEENT: Conjunctivae and lids unremarkable. Cardiovascular: Regular rhythm.  Lungs: Normal work of breathing. Neurologic: No focal deficits.   Lab Results  Component Value Date   CREATININE 0.91 03/20/2022   BUN 16 03/20/2022   NA 142 03/20/2022   K 4.6 03/20/2022   CL 103 03/20/2022   CO2 25 03/20/2022   Lab Results  Component Value Date   ALT 55 (H) 03/20/2022   AST 45 (H) 03/20/2022   ALKPHOS 100 03/20/2022   BILITOT 0.6 03/20/2022   Lab Results  Component Value Date   HGBA1C  5.4 02/27/2022   HGBA1C 5.9 05/06/2021   HGBA1C 5.5 03/30/2020   HGBA1C 5.6 08/14/2018   HGBA1C 5.8 (H) 11/11/2012   Lab Results  Component Value Date   INSULIN 25.9 (H) 03/20/2022   Lab Results  Component Value Date   TSH 1.550 03/20/2022   Lab Results  Component Value Date   CHOL 116 01/03/2022   HDL 51 01/03/2022   LDLCALC 52 01/03/2022   TRIG 60 01/03/2022   CHOLHDL 2.3 01/03/2022   Lab Results  Component Value Date   WBC 5.1 11/15/2021   HGB 15.9 11/15/2021   HCT 45.1 11/15/2021   MCV 92.1 11/15/2021   PLT 168.0 11/15/2021   No results found for: "IRON", "TIBC", "FERRITIN"  Attestation Statements:    Reviewed by clinician on day of visit: allergies, medications, problem list, medical history, surgical history, family history, social history, and previous encounter notes.   Wilhemena Durie, am acting as Location manager for [employer], DO.  I have reviewed the above documentation for accuracy and completeness, and I agree with the above. Jearld Lesch, DO

## 2022-04-06 ENCOUNTER — Encounter: Payer: Self-pay | Admitting: Bariatrics

## 2022-04-06 ENCOUNTER — Encounter: Payer: No Typology Code available for payment source | Attending: Cardiology

## 2022-04-06 DIAGNOSIS — Z79899 Other long term (current) drug therapy: Secondary | ICD-10-CM | POA: Insufficient documentation

## 2022-04-06 LAB — HEPATIC FUNCTION PANEL
ALT: 39 IU/L (ref 0–44)
AST: 28 IU/L (ref 0–40)
Albumin: 4.4 g/dL (ref 3.8–4.9)
Alkaline Phosphatase: 100 IU/L (ref 44–121)
Bilirubin Total: 0.3 mg/dL (ref 0.0–1.2)
Bilirubin, Direct: 0.11 mg/dL (ref 0.00–0.40)
Total Protein: 6.7 g/dL (ref 6.0–8.5)

## 2022-04-10 NOTE — Progress Notes (Signed)
Pt has been made aware of normal result and verbalized understanding.  jw

## 2022-04-13 NOTE — Progress Notes (Signed)
Chief Complaint:   OBESITY Brandon Madden is here to discuss his progress with his obesity treatment plan along with follow-up of his obesity related diagnoses. Brandon Madden is on the Category 2 plan and states he is following his eating plan approximately 60% of the time. Brandon Madden states he is walking and lifting weights for 30 minutes 4 times per week.  Today's visit was #: 2 Starting weight: 187 lbs Starting date: 03/20/22 Today's weight: 192 lbs Today's date: 04/03/22 Total lbs lost to date: 0 Total lbs lost since last in-office visit: +5  Interim History: He is up 5 pounds since his last visit.  It was not a "usual" week.  His wife has been hospitalized and just got home.  He has been eating more carbohydrates during this time.  The bioimpedance shows increased water (greater than 3 pounds).  Subjective:   1. Elevated liver enzymes (mild) ALT is 55.  AST is 45. History of's hepatic steatosis. Patient on **  2. Insulin resistance Insulin is 25.9.  3. Vitamin D deficiency Vitamin D with vitamin K OTC.   4. Other disorder of eating Has virtual appointment with Dr. Mallie Mussel.   Assessment/Plan:   1. Elevated liver enzymes 1.  Will go back and check labs for hepatitis B and C.  2. Insulin resistance 1.  Handout for insulin and prediabetes. 2.  Handout for metformin. 3.  Goal less than 10 for insulin.  3. Vitamin D deficiency 1.  Continue vitamin D with vitamin K over-the-counter. 2.  Recheck in 3 months.  4. Other disorder of eating Follow-up with Dr. Mallie Mussel.  5. Generalized obesity, BMI 31.0-31.9,adult 1.  Will adhere closely to the plan 80-90%. 2.  Reviewed labs 03/20/2022: CMP, vitamin D, insulin, thyroid panel.  Brandon Madden is currently in the action stage of change. As such, his goal is to continue with weight loss efforts. He has agreed to the Category 2 plan.  Exercise goals:  as is  Behavioral modification strategies: increasing lean protein intake, decreasing simple  carbohydrates, increasing vegetables, increasing water intake, decreasing eating out, no skipping meals, meal planning and cooking strategies, keeping healthy foods in the home, better snacking choices, and planning for success.  Brandon Madden has agreed to follow-up with our clinic in 2 weeks with [facility]. He was informed of the importance of frequent follow-up visits to maximize his success with intensive lifestyle modifications for his multiple health conditions.   Objective:   Blood pressure 111/74, pulse 75, temperature 98.1 F (36.7 C), height '5\' 6"'$  (1.676 m), weight 192 lb (87.1 kg), SpO2 95 %. Body mass index is 30.99 kg/m.  General: Cooperative, alert, well developed, in no acute distress. HEENT: Conjunctivae and lids unremarkable. Cardiovascular: Regular rhythm.  Lungs: Normal work of breathing. Neurologic: No focal deficits.   Lab Results  Component Value Date   CREATININE 0.91 03/20/2022   BUN 16 03/20/2022   NA 142 03/20/2022   K 4.6 03/20/2022   CL 103 03/20/2022   CO2 25 03/20/2022   Lab Results  Component Value Date   ALT 39 04/06/2022   AST 28 04/06/2022   ALKPHOS 100 04/06/2022   BILITOT 0.3 04/06/2022   Lab Results  Component Value Date   HGBA1C 5.4 02/27/2022   HGBA1C 5.9 05/06/2021   HGBA1C 5.5 03/30/2020   HGBA1C 5.6 08/14/2018   HGBA1C 5.8 (H) 11/11/2012   Lab Results  Component Value Date   INSULIN 25.9 (H) 03/20/2022   Lab Results  Component Value Date  TSH 1.550 03/20/2022   Lab Results  Component Value Date   CHOL 116 01/03/2022   HDL 51 01/03/2022   LDLCALC 52 01/03/2022   TRIG 60 01/03/2022   CHOLHDL 2.3 01/03/2022   Lab Results  Component Value Date   VD25OH 31.0 03/20/2022   VD25OH 26.15 (L) 05/06/2021   VD25OH 32.71 03/30/2020   Lab Results  Component Value Date   WBC 5.1 11/15/2021   HGB 15.9 11/15/2021   HCT 45.1 11/15/2021   MCV 92.1 11/15/2021   PLT 168.0 11/15/2021   No results found for: "IRON", "TIBC",  "FERRITIN"  Attestation Statements:   Reviewed by clinician on day of visit: allergies, medications, problem list, medical history, surgical history, family history, social history, and previous encounter notes.  I, Dawn Whitmire, FNP-C, am acting as transcriptionist for Dr. Jearld Lesch.  I have reviewed the above documentation for accuracy and completeness, and I agree with the above. Jearld Lesch, DO

## 2022-04-17 ENCOUNTER — Ambulatory Visit: Payer: No Typology Code available for payment source | Admitting: Bariatrics

## 2022-04-17 ENCOUNTER — Telehealth (INDEPENDENT_AMBULATORY_CARE_PROVIDER_SITE_OTHER): Payer: No Typology Code available for payment source | Admitting: Psychology

## 2022-04-17 ENCOUNTER — Encounter: Payer: Self-pay | Admitting: Bariatrics

## 2022-04-17 VITALS — BP 107/71 | HR 80 | Temp 97.7°F | Ht 66.0 in | Wt 190.0 lb

## 2022-04-17 DIAGNOSIS — E65 Localized adiposity: Secondary | ICD-10-CM

## 2022-04-17 DIAGNOSIS — Z6830 Body mass index (BMI) 30.0-30.9, adult: Secondary | ICD-10-CM | POA: Diagnosis not present

## 2022-04-17 DIAGNOSIS — F5089 Other specified eating disorder: Secondary | ICD-10-CM | POA: Diagnosis not present

## 2022-04-17 DIAGNOSIS — R7303 Prediabetes: Secondary | ICD-10-CM | POA: Diagnosis not present

## 2022-04-17 DIAGNOSIS — F4320 Adjustment disorder, unspecified: Secondary | ICD-10-CM | POA: Diagnosis not present

## 2022-04-17 DIAGNOSIS — E669 Obesity, unspecified: Secondary | ICD-10-CM

## 2022-04-17 NOTE — Progress Notes (Signed)
  Office: [phone]  /  Fax: [phone]    Date: April 17, 2022    Appointment Start Time: 8:30am Duration: 29 minutes Provider: Glennie Isle, Psy.D. Type of Session: Individual Therapy  Location of Patient: Work (private location) Location of Provider: Provider's Home (private office) Type of Contact: Telepsychological Visit via MyChart Video Visit  Session Content: Brandon Madden is a 62 y.o. male presenting for a follow-up appointment to address the previously established treatment goal of increasing coping skills.Today's appointment was a telepsychological visit. Brandon Madden provided verbal consent for today's telepsychological appointment and he is aware he is responsible for securing confidentiality on his end of the session. Prior to proceeding with today's appointment, Brandon Madden's physical location at the time of this appointment was obtained as well a phone number he could be reached at in the event of technical difficulties. Brandon Madden and this provider participated in today's telepsychological service.   This provider conducted a brief check-in. Brandon Madden shared the last two weeks have been "more stable" at home, but work continues to be stressful. Further explored and processed. This provider recommended longer-term therapeutic services due to ongoing stressors. This provider also engaged Brandon Madden in problem solving to help with eating regularly and congruent to his structured meal plan to ensure he is consuming enough protein. Moreover, psychoeducation regarding the hunger and satisfaction scale was provided. Brandon Madden provided verbal consent during today's appointment for this provider to send a handout with the scale via e-mail. Overall, Brandon Madden was receptive to today's appointment as evidenced by openness to sharing, responsiveness to feedback, and willingness to implement discussed strategies .  Mental Status Examination:  Appearance: neat Behavior: appropriate to circumstances Mood: neutral Affect: mood  congruent Speech: WNL Eye Contact: intermittent  Psychomotor Activity: WNL Gait: unable to assess Thought Process: linear, logical, and goal directed and no evidence or endorsement of suicidal, homicidal, and self-harm ideation, plan and intent  Thought Content/Perception: no hallucinations, delusions, bizarre thinking or behavior endorsed or observed Orientation: AAOx4 Memory/Concentration: memory, attention, language, and fund of knowledge intact  Insight: fair Judgment: fair  Interventions:  Conducted a brief chart review Provided empathic reflections and validation Provided positive reinforcement Employed supportive psychotherapy interventions to facilitate reduced distress and to improve coping skills with identified stressors Engaged patient in problem solving  DSM-5 Diagnosis(es): F50.89 Other Specified Feeding or Eating Disorder, Emotional Eating Behaviors and  F43.20 Adjustment Disorder, Unspecified   Treatment Goal & Progress: During the initial appointment with this provider, the following treatment goal was established: increase coping skills. Progress is limited, as Brandon Madden has just begun treatment with this provider; however, he is receptive to the interaction and interventions and rapport is being established.   Plan: The next appointment is scheduled for 05/01/2022 at 10am, which will be via MyChart Video Visit. The next session will focus on working towards the established treatment goal. Additionally, he provided verbal consent for this provider to place a referral with [facility].

## 2022-05-01 ENCOUNTER — Telehealth (INDEPENDENT_AMBULATORY_CARE_PROVIDER_SITE_OTHER): Payer: No Typology Code available for payment source | Admitting: Psychology

## 2022-05-01 DIAGNOSIS — F5089 Other specified eating disorder: Secondary | ICD-10-CM | POA: Diagnosis not present

## 2022-05-01 DIAGNOSIS — F4320 Adjustment disorder, unspecified: Secondary | ICD-10-CM

## 2022-05-01 NOTE — Progress Notes (Signed)
  Office: [phone]  /  Fax: [phone]    Date: May 01, 2022    Appointment Start Time: 10:08am Duration: 22 minutes Provider: Glennie Isle, Psy.D. Type of Session: Individual Therapy  Location of Patient: Work (private location) Location of Provider: [employer] (private office) Type of Contact: Telepsychological Visit via MyChart Video Visit  Session Content:This provider called Lennette Bihari at 10:03am as he did not present for today's appointment. Assistance was provided. This provider called again at 10:06am, and further assistance was provided to connect. As such, today's appointment was initiated 8 minutes late. Gildardo is a 62 y.o. male presenting for a follow-up appointment to address the previously established treatment goal of increasing coping skills.Today's appointment was a telepsychological visit. Lennette Bihari provided verbal consent for today's telepsychological appointment and he is aware he is responsible for securing confidentiality on his end of the session. Prior to proceeding with today's appointment, Adeyemi's physical location at the time of this appointment was obtained as well a phone number he could be reached at in the event of technical difficulties. Lennette Bihari and this provider participated in today's telepsychological service.   This provider conducted a brief check-in. Trei shared, "Things have been relatively well."  Despite ongoing stressors, he reported successfully working through them by praying, walking, and resting. Discussed the impact of stressors on eating habits. He acknowledged he did not always eat consistently. Psychoeducation provided regarding self-compassion. Neldon was engaged in a self-compassion exercise to help with eating-related challenges and other ongoing stressors. He was encouraged to regularly ask himself, "What do I need right now?" and "How can I comfort and care for myself in this moment?" Overall, Javeion was receptive to today's appointment as  evidenced by openness to sharing, responsiveness to feedback, and willingness to work toward increasing self-compassion.  Mental Status Examination:  Appearance: neat Behavior: appropriate to circumstances Mood: neutral Affect: mood congruent Speech: WNL Eye Contact: intermittent   Psychomotor Activity: WNL Gait: unable to assess Thought Process: linear, logical, and goal directed and no evidence or endorsement of suicidal, homicidal, and self-harm ideation, plan and intent  Thought Content/Perception: no hallucinations, delusions, bizarre thinking or behavior endorsed or observed Orientation: AAOx4 Memory/Concentration: memory, attention, language, and fund of knowledge intact  Insight: fair Judgment: fair  Interventions:  Conducted a brief chart review Provided empathic reflections and validation Provided positive reinforcement Employed supportive psychotherapy interventions to facilitate reduced distress and to improve coping skills with identified stressors Psychoeducation provided regarding self-compassion Engaged pt in a self-compassion exercise  DSM-5 Diagnosis(es):  F50.89 Other Specified Feeding or Eating Disorder, Emotional Eating Behaviors and  F43.20 Adjustment Disorder, Unspecified   Treatment Goal & Progress: During the initial appointment with this provider, the following treatment goal was established: increase coping skills. Eidan has demonstrated progress in his goal as evidenced by increased awareness of hunger patterns. Greysyn also continues to demonstrate willingness to engage in learned skill(s).  Plan: The next appointment is scheduled for 05/16/2022 at 8am, which will be via [place] Visit. The next session will focus on working towards the established treatment goal.

## 2022-05-02 ENCOUNTER — Encounter: Payer: Self-pay | Admitting: Nurse Practitioner

## 2022-05-02 ENCOUNTER — Encounter: Payer: Self-pay | Admitting: Bariatrics

## 2022-05-02 ENCOUNTER — Ambulatory Visit: Payer: No Typology Code available for payment source | Admitting: Nurse Practitioner

## 2022-05-02 VITALS — BP 121/77 | HR 80 | Temp 98.2°F | Ht 66.0 in | Wt 190.0 lb

## 2022-05-02 DIAGNOSIS — Z6830 Body mass index (BMI) 30.0-30.9, adult: Secondary | ICD-10-CM | POA: Diagnosis not present

## 2022-05-02 DIAGNOSIS — R748 Abnormal levels of other serum enzymes: Secondary | ICD-10-CM

## 2022-05-02 DIAGNOSIS — E669 Obesity, unspecified: Secondary | ICD-10-CM

## 2022-05-02 NOTE — Progress Notes (Signed)
Chief Complaint:   OBESITY Brandon Madden is here to discuss his progress with his obesity treatment plan along with follow-up of his obesity related diagnoses. Mylik is on the Category 2 plan and states he is following his eating plan approximately 70% of the time. Angela states he is walking and using resistance bands for 20-30 minutes 3 times per week.  Today's visit was #: 3 Starting weight: 187 lbs Starting date: 03/20/22 Today's weight: 190 lbs Today's date: 04/17/22 Total lbs lost to date: 0 Total lbs lost since last in-office visit: -2  Interim History: He is down 2 pounds since his last visit.  He had a more routine life in the last 2 weeks.  He has had some increased stress.  Subjective:   1. Prediabetes No medications.  2. Visceral obesity Visceral fat rating - 16.   Assessment/Plan:   1. Prediabetes 1.  Will keep all carbohydrates low (sweets and starches).  2. Visceral obesity 1.  Goal less than or equal to 13. 2.  Tips to lose belly fat.  3. Generalized obesity BMI 30.0-30.9,adult 1.  Will continue to adhere closely to the plan 80-90%. 2.  Increase protein for the evening meal. 3.  Increase water at work.  Kylenn is currently in the action stage of change. As such, his goal is to continue with weight loss efforts. He has agreed to the Category 2 plan.  Exercise goals: Walking and resistance bands 20-30 minutes 3 times per week.  Behavioral modification strategies: increasing lean protein intake, decreasing simple carbohydrates, increasing vegetables, increasing water intake, decreasing eating out, no skipping meals, meal planning and cooking strategies, keeping healthy foods in the home, and planning for success.  Braxden has agreed to follow-up with our clinic in 2 weeks with [facility]. He was informed of the importance of frequent follow-up visits to maximize his success with intensive lifestyle modifications for his multiple health conditions.   Objective:    Blood pressure 107/71, pulse 80, temperature 97.7 F (36.5 C), height '5\' 6"'$  (1.676 m), weight 190 lb (86.2 kg), SpO2 96 %. Body mass index is 30.67 kg/m.  General: Cooperative, alert, well developed, in no acute distress. HEENT: Conjunctivae and lids unremarkable. Cardiovascular: Regular rhythm.  Lungs: Normal work of breathing. Neurologic: No focal deficits.   Lab Results  Component Value Date   CREATININE 0.91 03/20/2022   BUN 16 03/20/2022   NA 142 03/20/2022   K 4.6 03/20/2022   CL 103 03/20/2022   CO2 25 03/20/2022   Lab Results  Component Value Date   ALT 39 04/06/2022   AST 28 04/06/2022   ALKPHOS 100 04/06/2022   BILITOT 0.3 04/06/2022   Lab Results  Component Value Date   HGBA1C 5.4 02/27/2022   HGBA1C 5.9 05/06/2021   HGBA1C 5.5 03/30/2020   HGBA1C 5.6 08/14/2018   HGBA1C 5.8 (H) 11/11/2012   Lab Results  Component Value Date   INSULIN 25.9 (H) 03/20/2022   Lab Results  Component Value Date   TSH 1.550 03/20/2022   Lab Results  Component Value Date   CHOL 116 01/03/2022   HDL 51 01/03/2022   LDLCALC 52 01/03/2022   TRIG 60 01/03/2022   CHOLHDL 2.3 01/03/2022   Lab Results  Component Value Date   VD25OH 31.0 03/20/2022   VD25OH 26.15 (L) 05/06/2021   VD25OH 32.71 03/30/2020   Lab Results  Component Value Date   WBC 5.1 11/15/2021   HGB 15.9 11/15/2021   HCT 45.1 11/15/2021  MCV 92.1 11/15/2021   PLT 168.0 11/15/2021   No results found for: "IRON", "TIBC", "FERRITIN"  Attestation Statements:   Reviewed by clinician on day of visit: allergies, medications, problem list, medical history, surgical history, family history, social history, and previous encounter notes.  I, Dawn Whitmire, FNP-C, am acting as transcriptionist for Dr. Jearld Lesch.  I have reviewed the above documentation for accuracy and completeness, and I agree with the above. Jearld Lesch, DO

## 2022-05-02 NOTE — Progress Notes (Signed)
Office: [phone]  /  Fax: [phone]  WEIGHT SUMMARY AND BIOMETRICS  Weight Lost Since Last Visit: 0lb  No data recorded  Vitals Temp: 98.2 F (36.8 C) BP: 121/77 Pulse Rate: 80 SpO2: 96 %   Anthropometric Measurements Height: '5\' 6"'$  (1.676 m) Weight: 190 lb (86.2 kg) BMI (Calculated): 30.68 Weight at Last Visit: 190lb Weight Lost Since Last Visit: 0lb Starting Weight: 188lb Total Weight Loss (lbs): 0 lb (0 kg)   Body Composition  Body Fat %: 29.9 % Fat Mass (lbs): 56.8 lbs Muscle Mass (lbs): 126.8 lbs Total Body Water (lbs): 88.8 lbs Visceral Fat Rating : 16   Other Clinical Data Today's Visit #: 3 Starting Date: 03/07/22     HPI  Chief Complaint: OBESITY  Brandon Madden is here to discuss his progress with his obesity treatment plan. He is on the the Category 2 Plan and states he is following his eating plan approximately 60-70 % of the time. He states he is exercising 20-30 minutes 3-4 days per week.   Interval History:  Since last office visit he has maintained his weight.  Notes some stress at work and at home.  His daughter may have to have surgery.  Some stress eating. He is meal prepping.  He is finding that he is eating laster then he wants at night due to his work schedule.   Walking occasionally.  Plans to start working in his yard.  Goal weight 160-170 lbs.       Pharmacotherapy for weight loss: He is not currently taking medications  for medical weight loss.   Bariatric surgery:  Never had bariatric surgery.   History of elevated liver enzymes:  Last Hepatic function panel was wnl.  Has fatty liver.    PHYSICAL EXAM:  Blood pressure 121/77, pulse 80, temperature 98.2 F (36.8 C), height '5\' 6"'$  (1.676 m), weight 190 lb (86.2 kg), SpO2 96 %. Body mass index is 30.67 kg/m.  General: He is overweight, cooperative, alert, well developed, and in no acute distress. PSYCH: Has normal mood, affect and thought process.   Extremities: No edema.   Neurologic: No gross sensory or motor deficits. No tremors or fasciculations noted.    DIAGNOSTIC DATA REVIEWED:  BMET    Component Value Date/Time   NA 142 03/20/2022 1011   K 4.6 03/20/2022 1011   CL 103 03/20/2022 1011   CO2 25 03/20/2022 1011   GLUCOSE 110 (H) 03/20/2022 1011   GLUCOSE 104 (H) 03/30/2020 1011   BUN 16 03/20/2022 1011   CREATININE 0.91 03/20/2022 1011   CALCIUM 9.4 03/20/2022 1011   GFRNONAA 93 11/11/2012 1000   GFRAA 108 11/11/2012 1000   Lab Results  Component Value Date   HGBA1C 5.4 02/27/2022   HGBA1C 5.8 (H) 11/11/2012   Lab Results  Component Value Date   INSULIN 25.9 (H) 03/20/2022   Lab Results  Component Value Date   TSH 1.550 03/20/2022   CBC    Component Value Date/Time   WBC 5.1 11/15/2021 1122   RBC 4.89 11/15/2021 1122   HGB 15.9 11/15/2021 1122   HGB 16.7 08/31/2021 1500   HCT 45.1 11/15/2021 1122   HCT 47.7 08/31/2021 1500   PLT 168.0 11/15/2021 1122   PLT 179 08/31/2021 1500   MCV 92.1 11/15/2021 1122   MCV 93 08/31/2021 1500   MCH 32.5 08/31/2021 1500   MCHC 35.2 11/15/2021 1122   RDW 12.5 11/15/2021 1122   RDW 14.2 08/31/2021 1500   Iron Studies  No results found for: "IRON", "TIBC", "FERRITIN", "IRONPCTSAT" Lipid Panel     Component Value Date/Time   CHOL 116 01/03/2022 0842   TRIG 60 01/03/2022 0842   HDL 51 01/03/2022 0842   CHOLHDL 2.3 01/03/2022 0842   CHOLHDL 5 03/30/2020 1011   VLDL 24.2 03/30/2020 1011   LDLCALC 52 01/03/2022 0842   Hepatic Function Panel     Component Value Date/Time   PROT 6.7 04/06/2022 0737   ALBUMIN 4.4 04/06/2022 0737   AST 28 04/06/2022 0737   ALT 39 04/06/2022 0737   ALKPHOS 100 04/06/2022 0737   BILITOT 0.3 04/06/2022 0737   BILIDIR 0.11 04/06/2022 0737      Component Value Date/Time   TSH 1.550 03/20/2022 1011   Nutritional Lab Results  Component Value Date   VD25OH 31.0 03/20/2022   VD25OH 26.15 (L) 05/06/2021   VD25OH 32.71 03/30/2020     ASSESSMENT AND  PLAN  TREATMENT PLAN FOR OBESITY:  Recommended Dietary Goals  Gamble is currently in the action stage of change. As such, his goal is to continue weight management plan. He has agreed to the Category 2 Plan.  Behavioral Intervention  We discussed the following Behavioral Modification Strategies today: increasing lean protein intake, decreasing simple carbohydrates , increasing vegetables, avoiding skipping meals, increasing water intake, and work on meal planning and easy cooking plans.  Additional resources provided today: NA  Recommended Physical Activity Goals  Jomari has been advised to work up to 150 minutes of moderate intensity aerobic activity a week and strengthening exercises 2-3 times per week for cardiovascular health, weight loss maintenance and preservation of muscle mass.   He has agreed to increase physical activity in their day and reduce sedentary time (increase NEAT).  and Patient also encouraged on scheduling and tracking physical activity.    ASSOCIATED CONDITIONS ADDRESSED TODAY  Action/Plan  Elevated liver enzymes Last labs looked better.  Has history of fatty liver. Will continue to monitor  Generalized obesity  BMI 30.0-30.9,adult         Return in about 2 weeks (around 05/16/2022).Marland Kitchen He was informed of the importance of frequent follow up visits to maximize his success with intensive lifestyle modifications for his multiple health conditions.   ATTESTASTION STATEMENTS:  Reviewed by clinician on day of visit: allergies, medications, problem list, medical history, surgical history, family history, social history, and previous encounter notes.   Time spent on visit including pre-visit chart review and post-visit care and charting was 30+ minutes.    Ailene Rud. Kemper Hochman FNP-C

## 2022-05-02 NOTE — Patient Instructions (Signed)

## 2022-05-15 ENCOUNTER — Encounter: Payer: Self-pay | Admitting: Bariatrics

## 2022-05-15 ENCOUNTER — Ambulatory Visit: Payer: No Typology Code available for payment source | Admitting: Bariatrics

## 2022-05-15 VITALS — BP 120/78 | HR 87 | Temp 97.0°F | Ht 66.0 in | Wt 189.0 lb

## 2022-05-15 DIAGNOSIS — Z6830 Body mass index (BMI) 30.0-30.9, adult: Secondary | ICD-10-CM

## 2022-05-15 DIAGNOSIS — K760 Fatty (change of) liver, not elsewhere classified: Secondary | ICD-10-CM | POA: Diagnosis not present

## 2022-05-15 DIAGNOSIS — E669 Obesity, unspecified: Secondary | ICD-10-CM | POA: Diagnosis not present

## 2022-05-15 DIAGNOSIS — E88819 Insulin resistance, unspecified: Secondary | ICD-10-CM

## 2022-05-16 ENCOUNTER — Telehealth (INDEPENDENT_AMBULATORY_CARE_PROVIDER_SITE_OTHER): Payer: No Typology Code available for payment source | Admitting: Psychology

## 2022-05-16 DIAGNOSIS — F5089 Other specified eating disorder: Secondary | ICD-10-CM | POA: Diagnosis not present

## 2022-05-16 DIAGNOSIS — F4320 Adjustment disorder, unspecified: Secondary | ICD-10-CM | POA: Diagnosis not present

## 2022-05-16 NOTE — Progress Notes (Unsigned)
Chief Complaint:   OBESITY Brandon Madden is here to discuss his progress with his obesity treatment plan along with follow-up of his obesity related diagnoses. Brandon Madden is on the Category 2 Plan and states he is following his eating plan approximately 70% of the time. Brandon Madden states he is walking for 30 minutes 3-4 times per week.  Today's visit was #: 5 Starting weight: 187 lbs Starting date: 03/20/2022 Today's weight: 189 lbs Today's date: 05/15/2022 Total lbs lost to date: 0 Total lbs lost since last in-office visit: 1  Interim History: Brandon Madden is down 1 pound since his last visit.  This diet is clear with food routine.  He is getting adequate water and increasing his protein.  He notes he is walking more and using a stand-up desk.  He has played pickle ball also.  Subjective:   1. Insulin resistance Brandon Madden is not on medications currently.  2. Fatty liver Brandon Madden denies abdominal pain.  Assessment/Plan:   1. Insulin resistance Brandon Madden will keep all carbohydrates low (sweets and starches).  2. Fatty liver Brandon Madden will continue with his diet and exercise.  3. Generalized obesity  4. BMI 30.0-30.9,adult Brandon Madden is currently in the action stage of change. As such, his goal is to continue with weight loss efforts. He has agreed to the Category 2 Plan.   He will adhere closely to the plan 80-90%.  Keep protein high (increased grilling).  Exercise goals: As is.   Behavioral modification strategies: increasing lean protein intake, decreasing simple carbohydrates, increasing vegetables, increasing water intake, decreasing eating out, no skipping meals, meal planning and cooking strategies, keeping healthy foods in the home, and planning for success.  Brandon Madden has agreed to follow-up with our clinic in 2 weeks. He was informed of the importance of frequent follow-up visits to maximize his success with intensive lifestyle modifications for his multiple health conditions.   Objective:   Blood  pressure 120/78, pulse 87, temperature (!) 97 F (36.1 C), height 5\' 6"  (1.676 m), weight 189 lb (85.7 kg), SpO2 97 %. Body mass index is 30.51 kg/m.  General: Cooperative, alert, well developed, in no acute distress. HEENT: Conjunctivae and lids unremarkable. Cardiovascular: Regular rhythm.  Lungs: Normal work of breathing. Neurologic: No focal deficits.   Lab Results  Component Value Date   CREATININE 0.91 03/20/2022   BUN 16 03/20/2022   NA 142 03/20/2022   K 4.6 03/20/2022   CL 103 03/20/2022   CO2 25 03/20/2022   Lab Results  Component Value Date   ALT 39 04/06/2022   AST 28 04/06/2022   ALKPHOS 100 04/06/2022   BILITOT 0.3 04/06/2022   Lab Results  Component Value Date   HGBA1C 5.4 02/27/2022   HGBA1C 5.9 05/06/2021   HGBA1C 5.5 03/30/2020   HGBA1C 5.6 08/14/2018   HGBA1C 5.8 (H) 11/11/2012   Lab Results  Component Value Date   INSULIN 25.9 (H) 03/20/2022   Lab Results  Component Value Date   TSH 1.550 03/20/2022   Lab Results  Component Value Date   CHOL 116 01/03/2022   HDL 51 01/03/2022   LDLCALC 52 01/03/2022   TRIG 60 01/03/2022   CHOLHDL 2.3 01/03/2022   Lab Results  Component Value Date   VD25OH 31.0 03/20/2022   VD25OH 26.15 (L) 05/06/2021   VD25OH 32.71 03/30/2020   Lab Results  Component Value Date   WBC 5.1 11/15/2021   HGB 15.9 11/15/2021   HCT 45.1 11/15/2021   MCV 92.1 11/15/2021  PLT 168.0 11/15/2021   No results found for: "IRON", "TIBC", "FERRITIN"  Attestation Statements:   Reviewed by clinician on day of visit: allergies, medications, problem list, medical history, surgical history, family history, social history, and previous encounter notes.   Wilhemena Durie, am acting as Location manager for [employer], DO.  I have reviewed the above documentation for accuracy and completeness, and I agree with the above. Jearld Lesch, DO

## 2022-05-16 NOTE — Progress Notes (Signed)
  Office: [phone]  /  Fax: [phone]    Date: May 16, 2022    Appointment Start Time: 8:01am Duration: 23 minutes Provider: Glennie Isle, Psy.D. Type of Session: Individual Therapy  Location of Patient: Work - parked in car (private/safe location) Location of Provider: Provider's Home (private office) Type of Contact: Telepsychological Visit via MyChart Video Visit  Session Content: Brandon Madden is a 62 y.o. male presenting for a follow-up appointment to address the previously established treatment goal of increasing coping skills.Today's appointment was a telepsychological visit. Brandon Madden provided verbal consent for today's telepsychological appointment and he is aware he is responsible for securing confidentiality on his end of the session. Prior to proceeding with today's appointment, Brandon Madden's physical location at the time of this appointment was obtained as well a phone number he could be reached at in the event of technical difficulties. Brandon Madden and this provider participated in today's telepsychological service.   This provider conducted a brief check-in. Brandon Madden stated, "Brandon Madden was a very stressful day."  Further explored and processed. This provider again recommended traditional therapeutic services; Brandon Madden agreed to follow-up with [facility]. Regarding eating habits, he acknowledged some deviations due to ongoing stressors. Brandon Madden was engaged in problem solving to help him eat regularly and congruent to his structured meal plan (e.g., leave foods/protein shakes at work; develop a Freight forwarder; double recipes). He also expressed a desire to increase physical activity. He was engaged in problem solving. Brandon Madden agreed to walk during his work day around noon to help increase physical activity but also help with stress reduction. Overall, Brandon Madden was receptive to today's appointment as evidenced by openness to sharing, responsiveness to feedback, and willingness to implement  discussed strategies .  Mental Status Examination:  Appearance: neat Behavior: appropriate to circumstances Mood: sad Affect: mood congruent Speech: WNL Eye Contact: intermittent Psychomotor Activity: WNL Gait: unable to assess Thought Process: linear, logical, and goal directed and no evidence or endorsement of suicidal, homicidal, and self-harm ideation, plan and intent  Thought Content/Perception: no hallucinations, delusions, bizarre thinking or behavior endorsed or observed Orientation: AAOx4 Memory/Concentration: memory, attention, language, and fund of knowledge intact  Insight: fair Judgment: fair  Interventions:  Conducted a brief chart review Provided empathic reflections and validation Employed supportive psychotherapy interventions to facilitate reduced distress and to improve coping skills with identified stressors Engaged patient in problem solving  DSM-5 Diagnosis(es):  F50.89 Other Specified Feeding or Eating Disorder, Emotional Eating Behaviors and  F43.20 Adjustment Disorder, Unspecified   Treatment Goal & Progress: During the initial appointment with this provider, the following treatment goal was established: increase coping skills. Brandon Madden has demonstrated progress in his goal as evidenced by increased awareness of hunger patterns and increased awareness of triggers for emotional eating behaviors. Brandon Madden also continues to demonstrate willingness to engage in learned skill(s).  Plan: The next appointment is scheduled for 06/06/2022 at 8am, which will be via [place] Visit. The next session will focus on working towards the established treatment goal. Brandon Madden will follow-up with [facility] to establish care.

## 2022-05-17 ENCOUNTER — Encounter: Payer: Self-pay | Admitting: Bariatrics

## 2022-05-30 ENCOUNTER — Encounter: Payer: Self-pay | Admitting: Nurse Practitioner

## 2022-05-30 ENCOUNTER — Telehealth: Payer: Self-pay | Admitting: Cardiology

## 2022-05-30 ENCOUNTER — Ambulatory Visit: Payer: No Typology Code available for payment source | Admitting: Nurse Practitioner

## 2022-05-30 VITALS — BP 115/75 | HR 80 | Temp 97.5°F | Ht 66.0 in | Wt 193.0 lb

## 2022-05-30 DIAGNOSIS — E669 Obesity, unspecified: Secondary | ICD-10-CM | POA: Diagnosis not present

## 2022-05-30 DIAGNOSIS — Z6831 Body mass index (BMI) 31.0-31.9, adult: Secondary | ICD-10-CM

## 2022-05-30 DIAGNOSIS — G4733 Obstructive sleep apnea (adult) (pediatric): Secondary | ICD-10-CM

## 2022-05-30 NOTE — Progress Notes (Signed)
Office: [phone]  /  Fax: [phone]  WEIGHT SUMMARY AND BIOMETRICS  No data recorded Weight Gained Since Last Visit: 4lb   Vitals Temp: (!) 97.5 F (36.4 C) BP: 115/75 Pulse Rate: 80 SpO2: 95 %   Anthropometric Measurements Height: 5\' 6"  (1.676 m) Weight: 193 lb (87.5 kg) BMI (Calculated): 31.17 Weight at Last Visit: 189lb Weight Gained Since Last Visit: 4lb Total Weight Loss (lbs): 0 lb (0 kg)   Body Composition  Body Fat %: 29.8 % Fat Mass (lbs): 57.8 lbs Muscle Mass (lbs): 129.2 lbs Total Body Water (lbs): 91 lbs Visceral Fat Rating : 16   Other Clinical Data Today's Visit #: 5 Starting Date: 03/20/22     HPI  Chief Complaint: OBESITY  Brandon Madden is here to discuss his progress with his obesity treatment plan. He is on the the Category 2 Plan and states he is following his eating plan approximately 65-75 % of the time. He states he is exercising 120 minutes 3 days per week-working in the yard, walking and pickle ball.   Interval History:  Since last office visit he has gained 4 pounds.  He is under a lot of stres and has been stress eating. He is snacking on quick things such as chocolate covered almonds.  He has reduced eating cereal at night.  He does try to make healthier choices.  He has not been sleeping well.  He is wakin gup multiple times at night.     Pharmacotherapy for weight loss: He is not currently taking medications  for medical weight loss.    Bariatric surgery:  Patient never had bariatric surgery  Obstructive Sleep Apnea Jiaan has a diagnosis of sleep apnea. He reports that he is using a CPAP regularly.  Reports snoring if he is laying on his back, denies apenic pauses, does reports PLMS.     PHYSICAL EXAM:  Blood pressure 115/75, pulse 80, temperature (!) 97.5 F (36.4 C), height 5\' 6"  (1.676 m), weight 193 lb (87.5 kg), SpO2 95 %. Body mass index is 31.15 kg/m.  General: He is overweight, cooperative, alert, well  developed, and in no acute distress. PSYCH: Has normal mood, affect and thought process.   Extremities: No edema.  Neurologic: No gross sensory or motor deficits. No tremors or fasciculations noted.    DIAGNOSTIC DATA REVIEWED:  BMET    Component Value Date/Time   NA 142 03/20/2022 1011   K 4.6 03/20/2022 1011   CL 103 03/20/2022 1011   CO2 25 03/20/2022 1011   GLUCOSE 110 (H) 03/20/2022 1011   GLUCOSE 104 (H) 03/30/2020 1011   BUN 16 03/20/2022 1011   CREATININE 0.91 03/20/2022 1011   CALCIUM 9.4 03/20/2022 1011   GFRNONAA 93 11/11/2012 1000   GFRAA 108 11/11/2012 1000   Lab Results  Component Value Date   HGBA1C 5.4 02/27/2022   HGBA1C 5.8 (H) 11/11/2012   Lab Results  Component Value Date   INSULIN 25.9 (H) 03/20/2022   Lab Results  Component Value Date   TSH 1.550 03/20/2022   CBC    Component Value Date/Time   WBC 5.1 11/15/2021 1122   RBC 4.89 11/15/2021 1122   HGB 15.9 11/15/2021 1122   HGB 16.7 08/31/2021 1500   HCT 45.1 11/15/2021 1122   HCT 47.7 08/31/2021 1500   PLT 168.0 11/15/2021 1122   PLT 179 08/31/2021 1500   MCV 92.1 11/15/2021 1122   MCV 93 08/31/2021 1500   MCH 32.5 08/31/2021 1500  MCHC 35.2 11/15/2021 1122   RDW 12.5 11/15/2021 1122   RDW 14.2 08/31/2021 1500   Iron Studies No results found for: "IRON", "TIBC", "FERRITIN", "IRONPCTSAT" Lipid Panel     Component Value Date/Time   CHOL 116 01/03/2022 0842   TRIG 60 01/03/2022 0842   HDL 51 01/03/2022 0842   CHOLHDL 2.3 01/03/2022 0842   CHOLHDL 5 03/30/2020 1011   VLDL 24.2 03/30/2020 1011   LDLCALC 52 01/03/2022 0842   Hepatic Function Panel     Component Value Date/Time   PROT 6.7 04/06/2022 0737   ALBUMIN 4.4 04/06/2022 0737   AST 28 04/06/2022 0737   ALT 39 04/06/2022 0737   ALKPHOS 100 04/06/2022 0737   BILITOT 0.3 04/06/2022 0737   BILIDIR 0.11 04/06/2022 0737      Component Value Date/Time   TSH 1.550 03/20/2022 1011   Nutritional Lab Results  Component  Value Date   VD25OH 31.0 03/20/2022   VD25OH 26.15 (L) 05/06/2021   VD25OH 32.71 03/30/2020     ASSESSMENT AND PLAN  TREATMENT PLAN FOR OBESITY:  Recommended Dietary Goals  Elree is currently in the action stage of change. As such, his goal is to continue weight management plan. He has agreed to the Category 2 Plan.  Behavioral Intervention  We discussed the following Behavioral Modification Strategies today: increasing lean protein intake, increasing fiber rich foods, avoiding skipping meals, increasing water intake, and work on meal planning and preparation.  Additional resources provided today: NA  Recommended Physical Activity Goals  Shango has been advised to work up to 150 minutes of moderate intensity aerobic activity a week and strengthening exercises 2-3 times per week for cardiovascular health, weight loss maintenance and preservation of muscle mass.   He has agreed to Continue current level of physical activity , Think about ways to increase physical activity, and Work on scheduling and tracking physical activity.    Pharmacotherapy We discussed various medication options to help Yamel with his weight loss efforts and we both agreed to consider his options and to discuss with cardiology.  I think he would be a good candidate for [facility] due his history of CAD, carotid stenosis, intermittent heart block, OSAS, fatty liver, IR.  See Select study.  Wants to discuss with cardiology  ASSOCIATED CONDITIONS ADDRESSED TODAY  Action/Plan  Obstructive sleep apnea syndrome Needs to follow up for his OSAS.  Last HST was 08/29/21.    Generalized obesity  BMI 31.0-31.9,adult      To keep appt with cardiology.     Return in about 2 weeks (around 06/13/2022).Marland Kitchen He was informed of the importance of frequent follow up visits to maximize his success with intensive lifestyle modifications for his multiple health conditions.   ATTESTASTION STATEMENTS:  Reviewed by clinician on  day of visit: allergies, medications, problem list, medical history, surgical history, family history, social history, and previous encounter notes.   Time spent on visit including pre-visit chart review and post-visit care and charting was 30 minutes.    Theodis Sato. Random Dobrowski FNP-C

## 2022-05-30 NOTE — Telephone Encounter (Signed)
Attempted to contact Pt in regards to remote transmission.  Left detailed message requesting Pt send a remote transmission.  Advised to call device clinic if any issues at [phone].

## 2022-05-30 NOTE — Telephone Encounter (Signed)
Called and spoke with patient.  He states he sent a manual transmission for his device about 20 minutes ago.  Patient describes his chest discomfort as intermittent cramping and soreness, with pressure occasionally over his heart. He states this is very similar to what he described at this last office visit on 02/03/22. Patient states this comes on when he is feeling stressed, he states he has a very stressful job. He does not sleep well. He reports he is staying hydrated.   Denies N/V, sweating, or radiating pain. Reports occasional SOB when ambulating.  He voices concern regarding his heart rate, he states it is irregular and he's not sure if his device is working or what it's showing. He states he's "left to drown" regarding information about what his device is showing.  Patient requested a sooner office visit to discuss his concerns. Offered next available appt with Francis Dowse, PA-C on 06/01/22. Patient accepted.  Advised patient on ED precautions, patient verbalized understanding.  Message sent to Ancil Boozer, RN in device regarding device transmission.

## 2022-05-30 NOTE — Patient Instructions (Addendum)
What is a GLP-1 Glucagon like peptide-1 (GLP-1) agonists represent a class of medications used to treat type 2 diabetes mellitus and obesity.  GLP-1 medications mimic the action of a hormone called glucagon like peptide 1.  When blood sugar levels start to rise/increase these drugs stimulate the body to produce more insulin.  When that happens, the extra insulin helps to lower the blood sugar levels in the body.  This in returns helps with decreasing cravings.  These medications also slow the movement of food from the stomach into the small intestine.  This in return helps one to full faster and longer.    Diabetic medications: Approved for treatment of diabetes mellitus but does not have full approval for weight loss use Victoza (liraglutide) Ozempic (semaglutide) Mounjaro Trulicity Rybelsus  Weight loss medications: Approved for long-term weight loss use.        Saxenda (liraglutide) Wegovy (semaglutide) Zepbound  Contraindications:  Pancreatitis (active gallstones) Medullary thyroid cancer High triglycerides (>500)-will need labs prior to starting Multiple Endocrine Neoplasia syndrome type 2 (MEN 2) Trying to get pregnant Breastfeeding Use with caution with taking insulin or sulfonylureas (will need to monitor blood sugars for hypoglycemia) Side effects (most common): Most common side effects are nausea, gas, bloating and constipation.  Other possible side effects are headaches, belching, diarrhea, tiredness (fatigue), vomiting, upset stomach, dizziness, heartburn and stomach (abdominal pain).  If you think that you are becoming dehydrated, please inform our office or your primary family provider.  Stop immediately and go to ER if you have any symptoms of a serious allergic reaction including swelling of your face, lips, tongue or throat; problems breathing or swallowing; severe rash or itching; fainting or feeling dizzy; or very rapid heart rate.        Steps to starting your  WegovyT  The office staff will send a prior authorization request to your insurance company for approval. We will send you a mychart message once we hear back from your insurance with a decision.  This can take up to 7-10 business days.   Once your WegovyTis approved, you may then pick up Wegovy pen from your pharmacy.    Learn how to do Wegovy injections on the Wegovy.com website. There is a training video that will walk you through how to safely perform the injection. If you have questions for our clinical staff, please contact our  clinical staff. If you have any symptoms of allergic reaction to WegovyT discontinue immediately and call 911.  1. What should I tell my provider before using WegovyT ? have or have had problems with your pancreas or kidneys. have type 2 diabetes and a history of diabetic retinopathy. have or have had depression, suicidal thoughts, or mental health issues. are pregnant or plan to become pregnant. WegovyT may harm your unborn baby. You should stop using WegovyT 3 months before you plan to become pregnant or if you are breastfeeding or plan to breastfeed. It is not known if WegovyT passes into your breast milk.  2. What is WegovyT and how does it work?  WegovyT is an injectable prescription medication prescribed by your provider to help with your weight loss.  This medicine will be most effective when combined with a reduced calorie diet and physical activity.  WegovyT is not for the treatment of type 2 diabetes mellitus. WegovyT should not be used with other GLP-1 receptor agonist medicines. The addition of WegovyT in  patients treated with insulin has not been evaluated. When initiating WegovyT, consider   reducing the dose of concomitantly administered insulin secretagogues (such as sulfonylureas) or insulin to reduce the risk of  hypoglycemia.  One role of GLP-1 is to send a signal to your brain to tell it you are full. It also slows down stomach emptying which  will make you feel full longer and may help with reducing cravings.   3.  How should I take WegovyT?  Administer WegovyT once weekly, on the same day each week, at any time of day, with or without meals Inject subcutaneously in the abdomen, thigh or upper arm Initiate at 0.25 mg once weekly for 4 weeks. In 4 week intervals, increase the dose until a dose of 2.4 mg is reached (we will discuss with you the dosage at each visit). The maintenance dose of WegovyT is 2.4 mg once weekly.  The dosing schedule of Wegovy is:  0.25 mg per week X 4 weeks 0.5 mg per week X 4 weeks 1.0 mg per week X 4 weeks 1.7 mg per week X 4 weeks 2.4 mg per week   Missed dose   If you miss your injection day, go ahead inject your current dose. You can go >7 days, but not <7 days between injections. You may change your injection day (It must be >7 days). If you miss >2 doses, you can still keep next injection dose the same or follow de-escalation schedule which may minimize GI symptoms.   In patients with type 2 diabetes, monitor blood glucose prior to starting and during WEGOVYT treatment.   Inject your dose of Wegovy under the skin (subcutaneous injection) in your stomach area (abdomen), upper leg (thigh) or upper arm. Do not inject into a vein or a muscle. The injection site should be rotated and not given in the same spot each day. Hold the needle under the skin and count to "10". This will allow all of the medicine to be dispensed under the skin. Always wipe your skin with an alcohol prep pad before injection  Dispose of used pen in an approved sharps container. More practical options that can be put in the trash  to go to the landfill are milk jugs or plastic laundry detergent containers with a screw on lid.  What side effects may I notice from taking WegovyT?  Side effects that usually do not require medical attention (report to our office if they continue or are bothersome): Nausea (most common but  decreases over time in most people as their body gets used to the medicine) Diarrhea Constipation (you may take an over the counter laxative if needed) Headache Decreased appetite Upset stomach Tiredness Dizziness Feeling bloated Hair loss Belching Gas Heartburn  Side effects that you should call 911 as soon as possible Vomiting Stomach pain Fever Yellowing of your skin or eyes  Clay-colored stools Increased heart rate while at rest Low blood sugar  Sudden changes in mood, behaviors, thoughts, feelings, or thoughts of suicide If you get a lump or swelling in your neck, hoarseness, trouble swallowing, or shortness of breath. Allergic reaction such as skin rash, itching, hives, swelling of the face, tongue, or lips  Helpful tips for managing nausea Nausea is a common side effect when first starting WegovyT. If you experience nausea, be sure to connect with your health care provider. He or she will offer guidance on ways to manage it, which may include: Eat bland, low-fat foods, like crackers, toast and rice  Eat foods that contain water, like soups and gelatin  Avoid lying   down after you eat  Go outdoors for fresh air  Eat more slowly    Other important information Do not drop your pen or knock it against hard surfaces  Do not expose your pen to any liquids  If you think that your pen may be damaged, do not try to fix it. Use a new one Keep the pen cap on until you are ready to inject. Your pen will no longer be sterile if you store an unused pen without the cap, if you pull the pen cap off and put it on again, or if the pen cap is missing. This could lead to an infection  Store the WegovyT pen in the refrigerator from 36F to 46F (2C to 8C) If needed, before removing the pen cap, WegovyT can be stored from 8C to 30C (46F to 86F) in the original carton for up to 28 days.  Keep WegovyT in the original carton to protect it from light  Do not freeze  Throw away pen if  WegovyT has been frozen, has been exposed to light or temperatures above 86F (30C), or has been out of the refrigerator for 28 days or longer It's important to properly dispose of your used WegovyT pens. Do not throw the pen away in your household trash. Instead, use an FDA-cleared sharps disposable container or a sturdy household container with a tight-fitting lid, like a heavy duty plastic container.   Wegovy pen training website: https://www.wegovy.com/about-wegovy/how-to-use-the-wegovy-pen.html  Wegovy savings and support link: https://www.wegovy.com/saving-and-support/save-and-support.html                                                                                        

## 2022-05-30 NOTE — Telephone Encounter (Signed)
Pt states he is having some cramping and soreness in his chest area for a few days. Pt denied any other symptoms. Please advise. ?    Pt also states his hr seems irregular and would like device to take a look.

## 2022-05-30 NOTE — Telephone Encounter (Signed)
Sent Mychart message requesting manual transmission.

## 2022-05-31 NOTE — Telephone Encounter (Signed)
Transmission received.  Presenting rhythm AS/VS 80 bpm.  Pt with some recent tachycardia episodes see below. Pt is not sure that he can correlate chest pressure with the times of the elevated heart rates. Made appt with Dr. Elberta Fortis this Thursday to review episodes and discuss.  Await further needs.

## 2022-06-01 ENCOUNTER — Ambulatory Visit: Payer: No Typology Code available for payment source | Attending: Cardiology | Admitting: Cardiology

## 2022-06-01 ENCOUNTER — Encounter: Payer: Self-pay | Admitting: Cardiology

## 2022-06-01 ENCOUNTER — Ambulatory Visit: Payer: No Typology Code available for payment source | Admitting: Physician Assistant

## 2022-06-01 VITALS — BP 110/70 | HR 80 | Ht 66.0 in | Wt 201.0 lb

## 2022-06-01 DIAGNOSIS — I480 Paroxysmal atrial fibrillation: Secondary | ICD-10-CM | POA: Diagnosis not present

## 2022-06-01 DIAGNOSIS — I2510 Atherosclerotic heart disease of native coronary artery without angina pectoris: Secondary | ICD-10-CM | POA: Diagnosis not present

## 2022-06-01 DIAGNOSIS — R55 Syncope and collapse: Secondary | ICD-10-CM | POA: Diagnosis not present

## 2022-06-01 DIAGNOSIS — I471 Supraventricular tachycardia, unspecified: Secondary | ICD-10-CM

## 2022-06-01 LAB — CUP PACEART REMOTE DEVICE CHECK
Battery Remaining Longevity: 170 mo
Battery Voltage: 3.12 V
Brady Statistic AP VP Percent: 6.55 %
Brady Statistic AP VS Percent: 9.52 %
Brady Statistic AS VP Percent: 0.08 %
Brady Statistic AS VS Percent: 83.85 %
Brady Statistic RA Percent Paced: 16.06 %
Brady Statistic RV Percent Paced: 6.63 %
Date Time Interrogation Session: 20240411054218
Implantable Lead Connection Status: 753985
Implantable Lead Connection Status: 753985
Implantable Lead Implant Date: 20230713
Implantable Lead Implant Date: 20230713
Implantable Lead Location: 753859
Implantable Lead Location: 753860
Implantable Lead Model: 3830
Implantable Lead Model: 5076
Implantable Pulse Generator Implant Date: 20230713
Lead Channel Impedance Value: 361 Ohm
Lead Channel Impedance Value: 418 Ohm
Lead Channel Impedance Value: 456 Ohm
Lead Channel Impedance Value: 608 Ohm
Lead Channel Pacing Threshold Amplitude: 0.5 V
Lead Channel Pacing Threshold Amplitude: 0.875 V
Lead Channel Pacing Threshold Pulse Width: 0.4 ms
Lead Channel Pacing Threshold Pulse Width: 0.4 ms
Lead Channel Sensing Intrinsic Amplitude: 13.5 mV
Lead Channel Sensing Intrinsic Amplitude: 13.5 mV
Lead Channel Sensing Intrinsic Amplitude: 2.5 mV
Lead Channel Sensing Intrinsic Amplitude: 2.5 mV
Lead Channel Setting Pacing Amplitude: 1.5 V
Lead Channel Setting Pacing Amplitude: 2 V
Lead Channel Setting Pacing Pulse Width: 0.4 ms
Lead Channel Setting Sensing Sensitivity: 1.2 mV
Zone Setting Status: 755011

## 2022-06-01 NOTE — Progress Notes (Signed)
Electrophysiology Office Note   Date:  06/01/2022   ID:  Brandon Madden, DOB January 05, 1961, MRN [id number]  PCP:  [place], Betty G, MD  Cardiologist: Shari Prows Primary Electrophysiologist:  Axavier Pressley Jorja Loa, MD    Chief Complaint: Syncope   History of Present Illness: Brandon Madden is a 62 y.o. male who is being seen today for the evaluation of syncope at the request of [place], Timoteo Expose, MD. Presenting today for electrophysiology evaluation.  He has a history significant for hypertension, COVID-19 infection.  He has been having episodes of syncope.  He was sitting at a restaurant and fell like he was going into a hole.  He had profound dizziness and fatigue.  He lost consciousness for 20 to 25 seconds.  The day before and he completed an 11 mile bike ride.  He had an ILR implanted that showed a 22-hour episode of atrial fibrillation as well as pauses associated with syncope.  He is post Medtronic dual-chamber pacemaker implanted 09/01/2021.  Today, denies symptoms of palpitations, chest pain, shortness of breath, orthopnea, PND, lower extremity edema, claudication, dizziness, presyncope, syncope, bleeding, or neurologic sequela. The patient is tolerating medications without difficulties.  He has intermittent palpitations and chest pain.  The chest pain is not associated with exertion.  He was able to work in his yard at a high level of exertion without any pain.  He states that when he moves his arms, sometimes he has a pressure type sensation in the center of his chest.  This is also associated with potential stress.  He does get intermittent palpitations, though they are rare.  Device interrogation shows episodes of atrial fibrillation.   Past Medical History:  Diagnosis Date   CAD (coronary artery disease) 07/13/2021   CCTA 06/2021: CAC score 260 (84th percentile); mLAD 70-99; mLAD FFR pos (0.60) Status post DES to the mid LAD 06/2021   Carotid stenosis 08/11/2021   Carotid US 07/2021: Bilat  ICA 1-39   CHF (congestive heart failure)    Deafness in right ear    Family history of pancreatic cancer    Family history of prostate cancer    Family history of skin cancer    Fatty liver    High cholesterol    Hyperlipidemia LDL goal <70 08/20/2018   PAF (paroxysmal atrial fibrillation) 05/06/2021   Pre-diabetes    Sleep apnea    Vitamin B 12 deficiency    Vitamin D deficiency    Past Surgical History:  Procedure Laterality Date   CARDIAC CATHETERIZATION     CORONARY STENT INTERVENTION N/A 07/15/2021   Procedure: CORONARY STENT INTERVENTION;  Surgeon: Lyn Records, MD;  Location: [facility] INVASIVE CV LAB;  Service: Cardiovascular;  Laterality: N/A;   CORONARY ULTRASOUND/IVUS N/A 07/15/2021   Procedure: Intravascular Ultrasound/IVUS;  Surgeon: Lyn Records, MD;  Location: [facility] INVASIVE CV LAB;  Service: Cardiovascular;  Laterality: N/A;   INSERT / REPLACE / REMOVE PACEMAKER     LEFT HEART CATH AND CORONARY ANGIOGRAPHY N/A 07/15/2021   Procedure: LEFT HEART CATH AND CORONARY ANGIOGRAPHY;  Surgeon: Lyn Records, MD;  Location: [facility] INVASIVE CV LAB;  Service: Cardiovascular;  Laterality: N/A;   LOOP RECORDER REMOVAL N/A 09/01/2021   Procedure: LOOP RECORDER REMOVAL;  Surgeon: Regan Lemming, MD;  Location: [facility] INVASIVE CV LAB;  Service: Cardiovascular;  Laterality: N/A;   NASAL SEPTUM SURGERY     PACEMAKER IMPLANT N/A 09/01/2021   Procedure: PACEMAKER IMPLANT;  Surgeon: Regan Lemming, MD;  Location: [facility]  INVASIVE CV LAB;  Service: Cardiovascular;  Laterality: N/A;     Current Outpatient Medications  Medication Sig Dispense Refill   acetaminophen (TYLENOL) 500 MG tablet Take 500-1,000 mg by mouth every 6 (six) hours as needed (pain.).     aspirin EC 81 MG tablet Take 1 tablet (81 mg total) by mouth daily. 90 tablet 3   aspirin-acetaminophen-caffeine (EXCEDRIN MIGRAINE) 250-250-65 MG tablet Take 1-2 tablets by mouth 2 (two) times daily as needed for headache.     clopidogrel  (PLAVIX) 75 MG tablet Take 1 tablet (75 mg total) by mouth daily. 90 tablet 3   diltiazem (CARDIZEM) 30 MG tablet Take 1 tablet (30 mg total) by mouth 4 (four) times daily as needed. 30 tablet 3   ezetimibe (ZETIA) 10 MG tablet Take 1 tablet (10 mg total) by mouth daily. 90 tablet 3   ibuprofen (ADVIL) 200 MG tablet Take 400-600 mg by mouth every 8 (eight) hours as needed (pain.).     Multiple Vitamins-Minerals (MULTI FOR HIM) TABS Take 1 tablet by mouth in the morning.     Polyethyl Glycol-Propyl Glycol (LUBRICANT EYE DROPS) 0.4-0.3 % SOLN Place 1-2 drops into both eyes 3 (three) times daily as needed (dry/irritated eyes.).     rosuvastatin (CRESTOR) 20 MG tablet Take 2 tablets (40 mg total) by mouth daily. 180 tablet 3   nitroGLYCERIN (NITROSTAT) 0.4 MG SL tablet Place 1 tablet (0.4 mg total) under the tongue every 5 (five) minutes as needed for chest pain. 90 tablet 3   No current facility-administered medications for this visit.    Allergies:   Bee venom   Social History:  The patient  reports that he has never smoked. He has never used smokeless tobacco. He reports that he does not drink alcohol and does not use drugs.   Family History:  The patient's family history includes Heart attack in his father; High Cholesterol in his father and mother; High blood pressure in his father; Pancreatic cancer in his maternal grandmother; Prostate cancer in his brother and brother; Skin cancer in his sister; Sudden death in his father.   ROS:  Please see the history of present illness.   Otherwise, review of systems is positive for none.   All other systems are reviewed and negative.   PHYSICAL EXAM: VS:  BP 110/70   Pulse 80   Ht 5\' 6"  (1.676 m)   Wt 201 lb (91.2 kg)   SpO2 98%   BMI 32.44 kg/m  , BMI Body mass index is 32.44 kg/m. GEN: Well nourished, well developed, in no acute distress  HEENT: normal  Neck: no JVD, carotid bruits, or masses Cardiac: RRR; no murmurs, rubs, or gallops,no  edema  Respiratory:  clear to auscultation bilaterally, normal work of breathing GI: soft, nontender, nondistended, + BS MS: no deformity or atrophy  Skin: warm and dry, device site well healed Neuro:  Strength and sensation are intact Psych: euthymic mood, full affect  EKG:  EKG is ordered today. Personal review of the ekg ordered shows sinus rhythm, rate 77  Personal review of the device interrogation today. Results in Paceart   Recent Labs: 10/21/2021: Magnesium 2.1 11/15/2021: Hemoglobin 15.9; Platelets 168.0 02/03/2022: NT-Pro BNP <36 03/20/2022: BUN 16; Creatinine, Ser 0.91; Potassium 4.6; Sodium 142; TSH 1.550 04/06/2022: ALT 39    Lipid Panel     Component Value Date/Time   CHOL 116 01/03/2022 0842   TRIG 60 01/03/2022 0842   HDL 51 01/03/2022 0842  CHOLHDL 2.3 01/03/2022 0842   CHOLHDL 5 03/30/2020 1011   VLDL 24.2 03/30/2020 1011   LDLCALC 52 01/03/2022 0842     Wt Readings from Last 3 Encounters:  06/01/22 201 lb (91.2 kg)  05/30/22 193 lb (87.5 kg)  05/15/22 189 lb (85.7 kg)      Other studies Reviewed: Additional studies/ records that were reviewed today include: TTE 06/03/20  Review of the above records today demonstrates:   1. Left ventricular ejection fraction, by estimation, is 55 to 60%. The  left ventricle has normal function. The left ventricle has no regional  wall motion abnormalities. Left ventricular diastolic parameters are  consistent with Grade I diastolic  dysfunction (impaired relaxation).   2. Right ventricular systolic function is normal. The right ventricular  size is normal.   3. The mitral valve is normal in structure. Trivial mitral valve  regurgitation.   4. The aortic valve is normal in structure. There is mild thickening of  the aortic valve. Aortic valve regurgitation is trivial. No aortic  stenosis is present.   Cardiac monitor 04/29/2020 personally reviewed Patch wear time was 13 days and 9 hours Predominant rhythm was NSR  with average HR 84 (ranged from 48-172bpm) Rare SVE, rare PVCs <1% No Afib, significant pauses or sustained arrhythmias Triggered events mainly correlated with PACs. Overall, normal cardiac monitor    ASSESSMENT AND PLAN:  1.  Syncope: Linq monitor with episodes of bradycardia associated with vagal episodes and pauses.  He is post Medtronic dual-chamber pacemaker implanted 09/01/2021.  Device functioning appropriately.  Sensing, threshold, impedance within normal limits.  No changes.  2.  Paroxysmal atrial fibrillation: Currently anticoagulated.  CHA2DS2-VASc of 1.  Episodes on pacemaker interrogation are all less than 4 hours.  On as needed diltiazem.  3.  Coronary artery disease: Currently on Crestor 40 mg daily per primary cardiology.  He is quite concerned about his chest pain.  He did have a Myoview that showed no evidence of ischemia.  I Maecy Podgurski discuss this further with his primary cardiologist, though I do not feel that further imaging is necessary as his pain is quite atypical.  4.  SVT: Short episodes.  Most of these occurred while he was working in the yard.  Kennetha Pearman continue to monitor.  Current medicines are reviewed at length with the patient today.   The patient does not have concerns regarding his medicines.  The following changes were made today: None  Labs/ tests ordered today include:  Orders Placed This Encounter  Procedures   EKG 12-Lead      Disposition:   FU 6 months  Signed, Kennita Pavlovich Jorja LoaMartin Aero Drummonds, MD  06/01/2022 4:34 PM     [facility] [address] [place] [address] [phone] (office) [phone] (fax)

## 2022-06-01 NOTE — Patient Instructions (Signed)
Medication Instructions:  Your physician recommends that you continue on your current medications as directed. Please refer to the Current Medication list given to you today.  *If you need a refill on your cardiac medications before your next appointment, please call your pharmacy*   Lab Work: None ordered If you have labs (blood work) drawn today and your tests are completely normal, you will receive your results only by: . MyChart Message (if you have MyChart) OR . A paper copy in the mail If you have any lab test that is abnormal or we need to change your treatment, we will call you to review the results.   Testing/Procedures: None ordered   Follow-Up: At [facility], you and your health needs are our priority.  As part of our continuing mission to provide you with exceptional heart care, we have created designated Provider Care Teams.  These Care Teams include your primary Cardiologist (physician) and Advanced Practice Providers (APPs -  Physician Assistants and Nurse Practitioners) who all work together to provide you with the care you need, when you need it.  We recommend signing up for the patient portal called "MyChart".  Sign up information is provided on this After Visit Summary.  MyChart is used to connect with patients for Virtual Visits (Telemedicine).  Patients are able to view lab/test results, encounter notes, upcoming appointments, etc.  Non-urgent messages can be sent to your provider as well.   To learn more about what you can do with MyChart, go to [email or web address].    Your next appointment:   6 month(s)  The format for your next appointment:   In Person  Provider:   Will Camnitz, MD   Thank you for choosing [facility]!!   Huckleberry Martinson, RN [phone]    Other Instructions    

## 2022-06-02 ENCOUNTER — Ambulatory Visit (INDEPENDENT_AMBULATORY_CARE_PROVIDER_SITE_OTHER): Payer: No Typology Code available for payment source

## 2022-06-02 DIAGNOSIS — I480 Paroxysmal atrial fibrillation: Secondary | ICD-10-CM

## 2022-06-06 ENCOUNTER — Telehealth (INDEPENDENT_AMBULATORY_CARE_PROVIDER_SITE_OTHER): Payer: No Typology Code available for payment source | Admitting: Psychology

## 2022-06-06 DIAGNOSIS — F4320 Adjustment disorder, unspecified: Secondary | ICD-10-CM | POA: Diagnosis not present

## 2022-06-06 DIAGNOSIS — F5089 Other specified eating disorder: Secondary | ICD-10-CM

## 2022-06-06 NOTE — Progress Notes (Signed)
  Office: [phone]  /  Fax: [phone]    Date: June 06, 2022    Appointment Start Time: 8:01am Duration: 28 minutes Provider: Lawerance Cruel, Psy.D. Type of Session: Individual Therapy  Location of Patient: Parked in car (address obtained; safe/private location) Location of Provider: Provider's Home (private office) Type of Contact: Telepsychological Visit via MyChart Video Visit  Session Content: Arty is a 62 y.o. male presenting for a follow-up appointment to address the previously established treatment goal of increasing coping skills.Today's appointment was a telepsychological visit. Caryn Bee provided verbal consent for today's telepsychological appointment and he is aware he is responsible for securing confidentiality on his end of the session. Prior to proceeding with today's appointment, Fernando's physical location at the time of this appointment was obtained as well a phone number he could be reached at in the event of technical difficulties. Caryn Bee and this provider participated in today's telepsychological service.   This provider conducted a brief check-in. Nazar shared about recent events, including an appointment with his cardiologist. Due to ongoing stressors, he acknowledged deviations with his eating habits. Further explored and processed. Reviewed strategies discussed during the last appointment. He agreed to set reminders. Additionally, psychoeducation provided regarding all or nothing thinking. Overall, Arch was receptive to today's appointment as evidenced by openness to sharing, responsiveness to feedback, and willingness to implement discussed strategies .  Mental Status Examination:  Appearance: neat Behavior: appropriate to circumstances Mood: sad Affect: mood congruent Speech: WNL Eye Contact: intermittent  Psychomotor Activity: WNL Gait: unable to assess Thought Process: linear, logical, and goal directed and no evidence or endorsement of suicidal, homicidal,  and self-harm ideation, plan and intent  Thought Content/Perception: no hallucinations, delusions, bizarre thinking or behavior endorsed or observed Orientation: AAOx4 Memory/Concentration: memory, attention, language, and fund of knowledge intact  Insight: fair Judgment: fair  Interventions:  Conducted a brief chart review Provided empathic reflections and validation Reviewed content from the previous session Employed supportive psychotherapy interventions to facilitate reduced distress and to improve coping skills with identified stressors Psychoeducation provided regarding all-or-nothing thinking  DSM-5 Diagnosis(es):  F50.89 Other Specified Feeding or Eating Disorder, Emotional Eating Behaviors and  F43.20 Adjustment Disorder, Unspecified   Treatment Goal & Progress: During the initial appointment with this provider, the following treatment goal was established: increase coping skills. Lacey has demonstrated progress in his goal as evidenced by increased awareness of hunger patterns and increased awareness of triggers for emotional eating behaviors. Trystan also continues to demonstrate willingness to engage in learned skill(s).  Plan: The next appointment is scheduled for 06/20/2022 at 8am, which will be via MyChart Video Visit. The next session will focus on working towards the established treatment goal. Dmetrius reported he completed the paperwork for [facility] and is waiting to be scheduled.

## 2022-06-13 ENCOUNTER — Encounter: Payer: Self-pay | Admitting: Nurse Practitioner

## 2022-06-13 ENCOUNTER — Telehealth: Payer: Self-pay

## 2022-06-13 ENCOUNTER — Ambulatory Visit: Payer: No Typology Code available for payment source | Admitting: Nurse Practitioner

## 2022-06-13 VITALS — BP 130/86 | HR 80 | Temp 98.0°F | Ht 66.0 in | Wt 193.0 lb

## 2022-06-13 DIAGNOSIS — E669 Obesity, unspecified: Secondary | ICD-10-CM

## 2022-06-13 DIAGNOSIS — I6523 Occlusion and stenosis of bilateral carotid arteries: Secondary | ICD-10-CM

## 2022-06-13 DIAGNOSIS — G4733 Obstructive sleep apnea (adult) (pediatric): Secondary | ICD-10-CM | POA: Diagnosis not present

## 2022-06-13 DIAGNOSIS — K760 Fatty (change of) liver, not elsewhere classified: Secondary | ICD-10-CM

## 2022-06-13 DIAGNOSIS — I2510 Atherosclerotic heart disease of native coronary artery without angina pectoris: Secondary | ICD-10-CM | POA: Diagnosis not present

## 2022-06-13 DIAGNOSIS — E785 Hyperlipidemia, unspecified: Secondary | ICD-10-CM

## 2022-06-13 DIAGNOSIS — I480 Paroxysmal atrial fibrillation: Secondary | ICD-10-CM

## 2022-06-13 DIAGNOSIS — Z6831 Body mass index (BMI) 31.0-31.9, adult: Secondary | ICD-10-CM

## 2022-06-13 DIAGNOSIS — R7303 Prediabetes: Secondary | ICD-10-CM

## 2022-06-13 DIAGNOSIS — Z950 Presence of cardiac pacemaker: Secondary | ICD-10-CM

## 2022-06-13 MED ORDER — WEGOVY 0.25 MG/0.5ML ~~LOC~~ SOAJ: 0.2500 mg | SUBCUTANEOUS | 0 refills | Status: DC

## 2022-06-13 NOTE — Telephone Encounter (Signed)
PA submitted through Cover My Meds for [facility]. Awaiting insurance determination.  Key: [id number]

## 2022-06-13 NOTE — Patient Instructions (Addendum)
What is a GLP-1 Glucagon like peptide-1 (GLP-1) agonists represent a class of medications used to treat type 2 diabetes mellitus and obesity.  GLP-1 medications mimic the action of a hormone called glucagon like peptide 1.  When blood sugar levels start to rise/increase these drugs stimulate the body to produce more insulin.  When that happens, the extra insulin helps to lower the blood sugar levels in the body.  This in returns helps with decreasing cravings.  These medications also slow the movement of food from the stomach into the small intestine.  This in return helps one to full faster and longer.    Diabetic medications: Approved for treatment of diabetes mellitus but does not have full approval for weight loss use Victoza (liraglutide) Ozempic (semaglutide) Mounjaro Trulicity Rybelsus  Weight loss medications: Approved for long-term weight loss use.        Saxenda (liraglutide) Wegovy (semaglutide) Zepbound  Contraindications:  Pancreatitis (active gallstones) Medullary thyroid cancer High triglycerides (>500)-will need labs prior to starting Multiple Endocrine Neoplasia syndrome type 2 (MEN 2) Trying to get pregnant Breastfeeding Use with caution with taking insulin or sulfonylureas (will need to monitor blood sugars for hypoglycemia) Side effects (most common): Most common side effects are nausea, gas, bloating and constipation.  Other possible side effects are headaches, belching, diarrhea, tiredness (fatigue), vomiting, upset stomach, dizziness, heartburn and stomach (abdominal pain).  If you think that you are becoming dehydrated, please inform our office or your primary family provider.  Stop immediately and go to ER if you have any symptoms of a serious allergic reaction including swelling of your face, lips, tongue or throat; problems breathing or swallowing; severe rash or itching; fainting or feeling dizzy; or very rapid heart rate.        Steps to starting your  WegovyT  The office staff will send a prior authorization request to your insurance company for approval. We will send you a mychart message once we hear back from your insurance with a decision.  This can take up to 7-10 business days.   Once your WegovyTis approved, you may then pick up Wegovy pen from your pharmacy.    Learn how to do Wegovy injections on the Wegovy.com website. There is a training video that will walk you through how to safely perform the injection. If you have questions for our clinical staff, please contact our  clinical staff. If you have any symptoms of allergic reaction to WegovyT discontinue immediately and call 911.  1. What should I tell my provider before using WegovyT ? have or have had problems with your pancreas or kidneys. have type 2 diabetes and a history of diabetic retinopathy. have or have had depression, suicidal thoughts, or mental health issues. are pregnant or plan to become pregnant. WegovyT may harm your unborn baby. You should stop using WegovyT 3 months before you plan to become pregnant or if you are breastfeeding or plan to breastfeed. It is not known if WegovyT passes into your breast milk.  2. What is WegovyT and how does it work?  WegovyT is an injectable prescription medication prescribed by your provider to help with your weight loss.  This medicine will be most effective when combined with a reduced calorie diet and physical activity.  WegovyT is not for the treatment of type 2 diabetes mellitus. WegovyT should not be used with other GLP-1 receptor agonist medicines. The addition of WegovyT in  patients treated with insulin has not been evaluated. When initiating WegovyT, consider   reducing the dose of concomitantly administered insulin secretagogues (such as sulfonylureas) or insulin to reduce the risk of  hypoglycemia.  One role of GLP-1 is to send a signal to your brain to tell it you are full. It also slows down stomach emptying which  will make you feel full longer and may help with reducing cravings.   3.  How should I take WegovyT?  Administer WegovyT once weekly, on the same day each week, at any time of day, with or without meals Inject subcutaneously in the abdomen, thigh or upper arm Initiate at 0.25 mg once weekly for 4 weeks. In 4 week intervals, increase the dose until a dose of 2.4 mg is reached (we will discuss with you the dosage at each visit). The maintenance dose of WegovyT is 2.4 mg once weekly.  The dosing schedule of Wegovy is:  0.25 mg per week X 4 weeks 0.5 mg per week X 4 weeks 1.0 mg per week X 4 weeks 1.7 mg per week X 4 weeks 2.4 mg per week   Missed dose   If you miss your injection day, go ahead inject your current dose. You can go >7 days, but not <7 days between injections. You may change your injection day (It must be >7 days). If you miss >2 doses, you can still keep next injection dose the same or follow de-escalation schedule which may minimize GI symptoms.   In patients with type 2 diabetes, monitor blood glucose prior to starting and during WEGOVYT treatment.   Inject your dose of Wegovy under the skin (subcutaneous injection) in your stomach area (abdomen), upper leg (thigh) or upper arm. Do not inject into a vein or a muscle. The injection site should be rotated and not given in the same spot each day. Hold the needle under the skin and count to "10". This will allow all of the medicine to be dispensed under the skin. Always wipe your skin with an alcohol prep pad before injection  Dispose of used pen in an approved sharps container. More practical options that can be put in the trash  to go to the landfill are milk jugs or plastic laundry detergent containers with a screw on lid.  What side effects may I notice from taking WegovyT?  Side effects that usually do not require medical attention (report to our office if they continue or are bothersome): Nausea (most common but  decreases over time in most people as their body gets used to the medicine) Diarrhea Constipation (you may take an over the counter laxative if needed) Headache Decreased appetite Upset stomach Tiredness Dizziness Feeling bloated Hair loss Belching Gas Heartburn  Side effects that you should call 911 as soon as possible Vomiting Stomach pain Fever Yellowing of your skin or eyes  Clay-colored stools Increased heart rate while at rest Low blood sugar  Sudden changes in mood, behaviors, thoughts, feelings, or thoughts of suicide If you get a lump or swelling in your neck, hoarseness, trouble swallowing, or shortness of breath. Allergic reaction such as skin rash, itching, hives, swelling of the face, tongue, or lips  Helpful tips for managing nausea Nausea is a common side effect when first starting WegovyT. If you experience nausea, be sure to connect with your health care provider. He or she will offer guidance on ways to manage it, which may include: Eat bland, low-fat foods, like crackers, toast and rice  Eat foods that contain water, like soups and gelatin  Avoid lying   down after you eat  Go outdoors for fresh air  Eat more slowly    Other important information Do not drop your pen or knock it against hard surfaces  Do not expose your pen to any liquids  If you think that your pen may be damaged, do not try to fix it. Use a new one Keep the pen cap on until you are ready to inject. Your pen will no longer be sterile if you store an unused pen without the cap, if you pull the pen cap off and put it on again, or if the pen cap is missing. This could lead to an infection  Store the WegovyT pen in the refrigerator from 36F to 46F (2C to 8C) If needed, before removing the pen cap, WegovyT can be stored from 8C to 30C (46F to 86F) in the original carton for up to 28 days.  Keep WegovyT in the original carton to protect it from light  Do not freeze  Throw away pen if  WegovyT has been frozen, has been exposed to light or temperatures above 86F (30C), or has been out of the refrigerator for 28 days or longer It's important to properly dispose of your used WegovyT pens. Do not throw the pen away in your household trash. Instead, use an FDA-cleared sharps disposable container or a sturdy household container with a tight-fitting lid, like a heavy duty plastic container.   Wegovy pen training website: https://www.wegovy.com/about-wegovy/how-to-use-the-wegovy-pen.html  Wegovy savings and support link: https://www.wegovy.com/saving-and-support/save-and-support.html                                                                                        

## 2022-06-13 NOTE — Progress Notes (Signed)
Office: [phone]  /  Fax: [phone]  WEIGHT SUMMARY AND BIOMETRICS  Weight Lost Since Last Visit: 0lb  Weight Gained Since Last Visit: 0lb   Vitals Temp: 98 F (36.7 C) BP: 130/86 Pulse Rate: 80 SpO2: 96 %   Anthropometric Measurements Height: 5\' 6"  (1.676 m) Weight: 193 lb (87.5 kg) BMI (Calculated): 31.17 Weight at Last Visit: 193lb Weight Lost Since Last Visit: 0lb Weight Gained Since Last Visit: 0lb Starting Weight: 188lb Total Weight Loss (lbs): 0 lb (0 kg)   Body Composition  Body Fat %: 30 % Fat Mass (lbs): 58 lbs Muscle Mass (lbs): 128.6 lbs Total Body Water (lbs): 90.4 lbs Visceral Fat Rating : 16   Other Clinical Data Fasting: No Today's Visit #: 6 Starting Date: 03/20/22     HPI  Chief Complaint: OBESITY  Brandon Madden is here to discuss his progress with his obesity treatment plan. He is on the the Category 2 Plan and states he is following his eating plan approximately 60 % of the time. He states he is exercising 30 minutes 3 days per week.  Started chair yoga this week. He has been walking more.     Interval History:  Since last office visit he has maintained his weight.  He is starting to see a therapist in May for stress/stress eating.      Pharmacotherapy for weight loss: He is not currently taking medications  for medical weight loss.    Bariatric surgery:  Patient has not had bariatric surgery.  Obstructive Sleep Apnea He reports that he is using a CPAP regularly.  Notes snoring if he is laying on his back, denies apenic pauses, does reports PLMS.    CAD/carotid stenosis/PAF/cardiac pacemaker Saw cardiology last on 06/01/22.  He is taking Crestor for HLD.  He is also taking an ASA 81mg , plavix 75mg , Zetia 10mg .    Hyperlipidemia Medication(s): Crestor and Zetia. Denies side effects.    Lab Results  Component Value Date   CHOL 116 01/03/2022   HDL 51 01/03/2022   LDLCALC 52 01/03/2022   TRIG 60 01/03/2022   CHOLHDL 2.3  01/03/2022   Lab Results  Component Value Date   ALT 39 04/06/2022   AST 28 04/06/2022   ALKPHOS 100 04/06/2022   BILITOT 0.3 04/06/2022   The ASCVD Risk score (Arnett DK, et al., 2019) failed to calculate for the following reasons:   The valid total cholesterol range is 130 to 320 mg/dL   Fatty liver Confirmed by abd [place] on 05/16/21.  Denies abd pain.    PHYSICAL EXAM:  Blood pressure 130/86, pulse 80, temperature 98 F (36.7 C), height 5\' 6"  (1.676 m), weight 193 lb (87.5 kg), SpO2 96 %. Body mass index is 31.15 kg/m.  General: He is overweight, cooperative, alert, well developed, and in no acute distress. PSYCH: Has normal mood, affect and thought process.   Extremities: No edema.  Neurologic: No gross sensory or motor deficits. No tremors or fasciculations noted.    DIAGNOSTIC DATA REVIEWED:  BMET    Component Value Date/Time   NA 142 03/20/2022 1011   K 4.6 03/20/2022 1011   CL 103 03/20/2022 1011   CO2 25 03/20/2022 1011   GLUCOSE 110 (H) 03/20/2022 1011   GLUCOSE 104 (H) 03/30/2020 1011   BUN 16 03/20/2022 1011   CREATININE 0.91 03/20/2022 1011   CALCIUM 9.4 03/20/2022 1011   GFRNONAA 93 11/11/2012 1000   GFRAA 108 11/11/2012 1000   Lab Results  Component  Value Date   HGBA1C 5.4 02/27/2022   HGBA1C 5.8 (H) 11/11/2012   Lab Results  Component Value Date   INSULIN 25.9 (H) 03/20/2022   Lab Results  Component Value Date   TSH 1.550 03/20/2022   CBC    Component Value Date/Time   WBC 5.1 11/15/2021 1122   RBC 4.89 11/15/2021 1122   HGB 15.9 11/15/2021 1122   HGB 16.7 08/31/2021 1500   HCT 45.1 11/15/2021 1122   HCT 47.7 08/31/2021 1500   PLT 168.0 11/15/2021 1122   PLT 179 08/31/2021 1500   MCV 92.1 11/15/2021 1122   MCV 93 08/31/2021 1500   MCH 32.5 08/31/2021 1500   MCHC 35.2 11/15/2021 1122   RDW 12.5 11/15/2021 1122   RDW 14.2 08/31/2021 1500   Iron Studies No results found for: "IRON", "TIBC", "FERRITIN", "IRONPCTSAT" Lipid Panel      Component Value Date/Time   CHOL 116 01/03/2022 0842   TRIG 60 01/03/2022 0842   HDL 51 01/03/2022 0842   CHOLHDL 2.3 01/03/2022 0842   CHOLHDL 5 03/30/2020 1011   VLDL 24.2 03/30/2020 1011   LDLCALC 52 01/03/2022 0842   Hepatic Function Panel     Component Value Date/Time   PROT 6.7 04/06/2022 0737   ALBUMIN 4.4 04/06/2022 0737   AST 28 04/06/2022 0737   ALT 39 04/06/2022 0737   ALKPHOS 100 04/06/2022 0737   BILITOT 0.3 04/06/2022 0737   BILIDIR 0.11 04/06/2022 0737      Component Value Date/Time   TSH 1.550 03/20/2022 1011   Nutritional Lab Results  Component Value Date   VD25OH 31.0 03/20/2022   VD25OH 26.15 (L) 05/06/2021   VD25OH 32.71 03/30/2020     ASSESSMENT AND PLAN  TREATMENT PLAN FOR OBESITY:  Recommended Dietary Goals  Brandon Madden is currently in the action stage of change. As such, his goal is to continue weight management plan. He has agreed to the Category 2 Plan.  Behavioral Intervention  We discussed the following Behavioral Modification Strategies today: increasing lean protein intake, decreasing simple carbohydrates , increasing vegetables, increasing lower glycemic fruits, increasing fiber rich foods, avoiding skipping meals, increasing water intake, continue to practice mindfulness when eating, and planning for success.  Additional resources provided today: NA  Recommended Physical Activity Goals  Brandon Madden has been advised to work up to 150 minutes of moderate intensity aerobic activity a week and strengthening exercises 2-3 times per week for cardiovascular health, weight loss maintenance and preservation of muscle mass.   He has agreed to Continue current level of physical activity    Pharmacotherapy We discussed various medication options to help Brandon Madden with his weight loss efforts and we both agreed to start [facility] 0.25mg . Side effects discussed.  Would benefit from starting [facility] due to cardiac history.  See SELECT studay.   Avoid  Phentermine and Qsymia due to cardiac history.   Avoid Orlistat due to Vit D def.    ASSOCIATED CONDITIONS ADDRESSED TODAY  Action/Plan  Prediabetes -     UJWJXB; Inject 0.25 mg into the skin once a week.  Dispense: 2 mL; Refill: 0  Brandon Madden will continue to work on weight loss, exercise, and decreasing simple carbohydrates to help decrease the risk of diabetes.    Obstructive sleep apnea syndrome -     JYNWGN; Inject 0.25 mg into the skin once a week.  Dispense: 2 mL; Refill: 0  Continue CPAP nightly   Coronary artery disease involving native coronary artery of native heart without angina  pectoris -     ZOXWRU; Inject 0.25 mg into the skin once a week.  Dispense: 2 mL; Refill: 0  Continue to follow up with cardiology and PCP.  Continue meds as directed.    Bilateral carotid artery stenosis -     EAVWUJ; Inject 0.25 mg into the skin once a week.  Dispense: 2 mL; Refill: 0  Continue to follow up with cardiology and PCP.  Continue meds as directed.   Hyperlipidemia LDL goal <70 -     [id number]; Inject 0.25 mg into the skin once a week.  Dispense: 2 mL; Refill: 0  Continue to follow up with cardiology and PCP.  Continue meds as directed.   PAF (paroxysmal atrial fibrillation) -     WJXBJY; Inject 0.25 mg into the skin once a week.  Dispense: 2 mL; Refill: 0  Continue to follow up with cardiology and PCP.  Continue meds as directed.   Cardiac pacemaker in situ -     NWGNFA; Inject 0.25 mg into the skin once a week.  Dispense: 2 mL; Refill: 0  Continue to follow up with cardiology and PCP.  Continue meds as directed.   Fatty liver -     Wegovy; Inject 0.25 mg into the skin once a week.  Dispense: 2 mL; Refill: 0  Continue working on dietary changes, exercise and weight loss.    Generalized obesity -     [id number]; Inject 0.25 mg into the skin once a week.  Dispense: 2 mL; Refill: 0  BMI 31.0-31.9,adult -     OZHYQM; Inject 0.25 mg into the skin once a week.  Dispense: 2 mL;  Refill: 0       Return in about 2 weeks (around 06/27/2022).Marland Kitchen He was informed of the importance of frequent follow up visits to maximize his success with intensive lifestyle modifications for his multiple health conditions.   ATTESTASTION STATEMENTS:  Reviewed by clinician on day of visit: allergies, medications, problem list, medical history, surgical history, family history, social history, and previous encounter notes.     Theodis Sato. Macrina Lehnert FNP-C

## 2022-06-20 ENCOUNTER — Telehealth (INDEPENDENT_AMBULATORY_CARE_PROVIDER_SITE_OTHER): Payer: No Typology Code available for payment source | Admitting: Psychology

## 2022-06-22 ENCOUNTER — Encounter: Payer: Self-pay | Admitting: Cardiology

## 2022-06-27 ENCOUNTER — Encounter: Payer: Self-pay | Admitting: Bariatrics

## 2022-06-27 ENCOUNTER — Ambulatory Visit: Payer: No Typology Code available for payment source | Admitting: Bariatrics

## 2022-06-27 VITALS — Ht 66.0 in | Wt 192.0 lb

## 2022-06-27 DIAGNOSIS — Z6830 Body mass index (BMI) 30.0-30.9, adult: Secondary | ICD-10-CM

## 2022-06-27 DIAGNOSIS — R748 Abnormal levels of other serum enzymes: Secondary | ICD-10-CM

## 2022-06-27 DIAGNOSIS — E785 Hyperlipidemia, unspecified: Secondary | ICD-10-CM

## 2022-06-27 DIAGNOSIS — R7303 Prediabetes: Secondary | ICD-10-CM | POA: Diagnosis not present

## 2022-06-27 DIAGNOSIS — K760 Fatty (change of) liver, not elsewhere classified: Secondary | ICD-10-CM

## 2022-06-27 DIAGNOSIS — G4733 Obstructive sleep apnea (adult) (pediatric): Secondary | ICD-10-CM

## 2022-06-27 DIAGNOSIS — E669 Obesity, unspecified: Secondary | ICD-10-CM

## 2022-06-27 DIAGNOSIS — Z6831 Body mass index (BMI) 31.0-31.9, adult: Secondary | ICD-10-CM

## 2022-06-27 DIAGNOSIS — Z950 Presence of cardiac pacemaker: Secondary | ICD-10-CM

## 2022-06-27 DIAGNOSIS — I2510 Atherosclerotic heart disease of native coronary artery without angina pectoris: Secondary | ICD-10-CM

## 2022-06-27 DIAGNOSIS — I6523 Occlusion and stenosis of bilateral carotid arteries: Secondary | ICD-10-CM | POA: Diagnosis not present

## 2022-06-27 MED ORDER — WEGOVY 0.25 MG/0.5ML ~~LOC~~ SOAJ: 0.2500 mg | SUBCUTANEOUS | 0 refills | Status: DC

## 2022-06-27 NOTE — Telephone Encounter (Signed)
Received through Cover My Meds: Why your request was denied: Your plan only covers this drug when you experience benefits from taking the drug. We have denied your request because you did not have good outcomes from the drug. We reviewed the information we had. Your request has been denied. Your doctor can send [place] any new or missing information for [place] to review. For this drug, you may have to meet other criteria. You can request the drug policy for more details. You can also request other plan documents for your review.

## 2022-06-28 ENCOUNTER — Telehealth: Payer: Self-pay

## 2022-06-28 NOTE — Progress Notes (Signed)
Chief Complaint:   OBESITY Brandon Madden is here to discuss his progress with his obesity treatment plan along with follow-up of his obesity related diagnoses. Brandon Madden is on the Category 2 Plan and states he is following his eating plan approximately 60% of the time. Brandon Madden states he is walking gardening for 30-120 minutes 4 times per week.  Today's visit was #: 8 Starting weight: 187 lbs Starting date: 03/20/2022 Today's weight: 192 lbs Today's date: 06/27/2022 Total lbs lost to date: 0 Total lbs lost since last in-office visit: 1  Interim History: Brandon Madden is down 1 lb since his last visit.   Subjective:   1. Prediabetes Brandon Madden is not on medications, and he has not taken medications before. His last glucose was 101 on 06/23/2022.  2. Elevated liver enzymes Brandon Madden's levels has decreased from his last labs.   3. Coronary artery disease involving native coronary artery of native heart without angina pectoris Brandon Madden denies chest pain.   4. Bilateral carotid artery stenosis Brandon Madden denies chest pain.  5. Cardiac pacemaker in situ Brandon Madden is working on his diet and exercise.   6. Fatty liver Brandon Madden's liver enzymes have decreased. He is working on his weight loss.   7. Obstructive sleep apnea syndrome Brandon Madden is using CPAP.   8. Hyperlipidemia LDL goal <70 Brandon Madden is taking his medications as directed.   Assessment/Plan:   1. Prediabetes [place] agreed to start Wegovy at 0.25 mg once weekly with no refills. He will work on decreasing carbohydrates (sweets and starches), and keep protein high.   - Semaglutide-Weight Management (WEGOVY) 0.25 MG/0.5ML SOAJ; Inject 0.25 mg into the skin once a week.  Dispense: 2 mL; Refill: 0  2. Elevated liver enzymes Brandon Madden will continue with his weight loss and meal plan.   3. Coronary artery disease involving native coronary artery of native heart without angina pectoris Brandon Madden will follow-up with his PCP and Cardiologist.  He will begin [facility] at 0.25 mg, and  will continue his other medications.   4. Bilateral carotid artery stenosis Brandon Madden will continue with his meal plan and exercise.   5. Cardiac pacemaker in situ Brandon Madden will continue with his diet and exercise.   6. Fatty liver Brandon Madden will continue with his diet and exercise.   7. Obstructive sleep apnea syndrome Brandon Madden will continue with his CPAP compliance.   8. Hyperlipidemia LDL goal <70 Brandon Madden will continue his medications.   9. Generalized obesity  10. BMI 31.0-31.9,adult - Semaglutide-Weight Management (WEGOVY) 0.25 MG/0.5ML SOAJ; Inject 0.25 mg into the skin once a week.  Dispense: 2 mL; Refill: 0  Brandon Madden is currently in the action stage of change. As such, his goal is to continue with weight loss efforts. He has agreed to the Category 2 Plan.   Meal planning and intentional eating were discussed. He will bring in his last labs from work to his next visit. Reviewed labs with the patient from 06/23/2022, CMP, lipid, TSH, and CBC.   Exercise goals: As is.   Behavioral modification strategies: increasing lean protein intake, decreasing simple carbohydrates, increasing vegetables, increasing water intake, decreasing eating out, no skipping meals, meal planning and cooking strategies, keeping healthy foods in the home, and planning for success.  Brandon Madden has agreed to follow-up with our clinic in 3 weeks. He was informed of the importance of frequent follow-up visits to maximize his success with intensive lifestyle modifications for his multiple health conditions.   Objective:   Height 5\' 6"  (1.676 m), weight 192 lb (  87.1 kg). Body mass index is 30.99 kg/m.  General: Cooperative, alert, well developed, in no acute distress. HEENT: Conjunctivae and lids unremarkable. Cardiovascular: Regular rhythm.  Lungs: Normal work of breathing. Neurologic: No focal deficits.   Lab Results  Component Value Date   CREATININE 0.91 03/20/2022   BUN 16 03/20/2022   NA 142 03/20/2022   K 4.6  03/20/2022   CL 103 03/20/2022   CO2 25 03/20/2022   Lab Results  Component Value Date   ALT 39 04/06/2022   AST 28 04/06/2022   ALKPHOS 100 04/06/2022   BILITOT 0.3 04/06/2022   Lab Results  Component Value Date   HGBA1C 5.4 02/27/2022   HGBA1C 5.9 05/06/2021   HGBA1C 5.5 03/30/2020   HGBA1C 5.6 08/14/2018   HGBA1C 5.8 (H) 11/11/2012   Lab Results  Component Value Date   INSULIN 25.9 (H) 03/20/2022   Lab Results  Component Value Date   TSH 1.550 03/20/2022   Lab Results  Component Value Date   CHOL 116 01/03/2022   HDL 51 01/03/2022   LDLCALC 52 01/03/2022   TRIG 60 01/03/2022   CHOLHDL 2.3 01/03/2022   Lab Results  Component Value Date   VD25OH 31.0 03/20/2022   VD25OH 26.15 (L) 05/06/2021   VD25OH 32.71 03/30/2020   Lab Results  Component Value Date   WBC 5.1 11/15/2021   HGB 15.9 11/15/2021   HCT 45.1 11/15/2021   MCV 92.1 11/15/2021   PLT 168.0 11/15/2021   No results found for: "IRON", "TIBC", "FERRITIN"  Attestation Statements:   Reviewed by clinician on day of visit: allergies, medications, problem list, medical history, surgical history, family history, social history, and previous encounter notes.   Trude Mcburney, am acting as Energy manager for [employer], DO.  I have reviewed the above documentation for accuracy and completeness, and I agree with the above. Corinna Capra, DO

## 2022-06-28 NOTE — Telephone Encounter (Signed)
Resubmitted PA for [facility] via covermymeds

## 2022-06-29 ENCOUNTER — Encounter: Payer: Self-pay | Admitting: Bariatrics

## 2022-07-03 ENCOUNTER — Telehealth (INDEPENDENT_AMBULATORY_CARE_PROVIDER_SITE_OTHER): Payer: Self-pay | Admitting: Psychology

## 2022-07-03 NOTE — Telephone Encounter (Signed)
  Office: [phone]  /  Fax: [phone]  Date of Call: Jul 03, 2022  Time of Call: 12:54pm Provider: Lawerance Cruel, PsyD  CONTENT: This provider called Caryn Bee to check-in and schedule a follow-up appointment. A HIPAA compliant voicemail was left requesting a call back.   PLAN: This provider will wait for Mosi to call back. No further follow-up planned by this provider.

## 2022-07-05 NOTE — Progress Notes (Signed)
Remote pacemaker transmission.   

## 2022-07-11 ENCOUNTER — Encounter: Payer: Self-pay | Admitting: Nurse Practitioner

## 2022-07-11 ENCOUNTER — Ambulatory Visit (INDEPENDENT_AMBULATORY_CARE_PROVIDER_SITE_OTHER): Payer: No Typology Code available for payment source | Admitting: Psychology

## 2022-07-11 ENCOUNTER — Ambulatory Visit (INDEPENDENT_AMBULATORY_CARE_PROVIDER_SITE_OTHER): Payer: No Typology Code available for payment source | Admitting: Nurse Practitioner

## 2022-07-11 VITALS — BP 129/85 | HR 85 | Temp 97.8°F | Ht 66.0 in | Wt 194.0 lb

## 2022-07-11 DIAGNOSIS — F4321 Adjustment disorder with depressed mood: Secondary | ICD-10-CM | POA: Diagnosis not present

## 2022-07-11 DIAGNOSIS — R7303 Prediabetes: Secondary | ICD-10-CM | POA: Diagnosis not present

## 2022-07-11 DIAGNOSIS — E669 Obesity, unspecified: Secondary | ICD-10-CM

## 2022-07-11 DIAGNOSIS — Z6831 Body mass index (BMI) 31.0-31.9, adult: Secondary | ICD-10-CM

## 2022-07-11 NOTE — Progress Notes (Signed)
[facility] Counselor Initial Adult Exam  Name: Brandon Madden Date: 07/11/2022 MRN: [id number] DOB: [birth year] PCP: [place], Betty G, MD  Time spent: 45 mins  Guardian/Payee:  Pt  Paperwork requested: No   Reason for Visit /Presenting Problem: Pt presents for session in person in the office, granting consent for the session.  Mental Status Exam: Appearance:   Casual     Behavior:  Appropriate  Motor:  Normal  Speech/Language:   Clear and Coherent  Affect:  Appropriate  Mood:  normal  Thought process:  normal  Thought content:    WNL  Sensory/Perceptual disturbances:    WNL  Orientation:  oriented to person, place, and time/date  Attention:  Good  Concentration:  Good  Memory:  WNL  Fund of knowledge:   Good  Insight:    Good  Judgment:   Good  Impulse Control:  Good   Reported Symptoms:  Pt shares he is seeking therapy at this time due to stress in his life.  Last year, pt had a 95% blockage in his LAD and had a stent placed over Memorial Day weekend last year.  Pt was aware that this required a life style change.  Pt also had a pacemaker put in 09/01/21.  Pt shares that he believes stress to be the biggest issue for him.  He shares that his stressors include work, family dynamics, and life perspectives.  Risk Assessment: Danger to Self:  No Self-injurious Behavior: No Danger to Others: No Duty to Warn:no Physical Aggression / Violence:No  Access to Firearms a concern: No  Gang Involvement:No  Patient / guardian was educated about steps to take if suicide or homicide risk level increases between visits: n/a While future psychiatric events cannot be accurately predicted, the patient does not currently require acute inpatient psychiatric care and does not currently meet [facility] involuntary commitment criteria.  Substance Abuse History: Current substance abuse: No ; pt used to drink but 12 yrs ago decided not to drink anymore; [name] has never  drank  Past Psychiatric History:   No previous psychological problems have been observed Outpatient Providers:two therapists in the past; last one was about 20 yrs ago History of Psych Hospitalization: No  Psychological Testing:  none    Abuse History:  Victim of: No.,  none    Report needed: No. Victim of Neglect:No. Perpetrator of  none   Witness / Exposure to Domestic Violence: No   Protective Services Involvement: No  Witness to [employer] Violence:  No   Family History:  Family History  Problem Relation Age of Onset   High Cholesterol Mother    Heart attack Father    High Cholesterol Father    High blood pressure Father    Sudden death Father    Skin cancer Sister    Prostate cancer Brother        dx 81   Prostate cancer Brother        dx 34, metastatic, d 84   Pancreatic cancer Maternal Grandmother        dx 53s, d 65s    Living situation: the patient lives with their family; born in [place], grew up in [place], moved to [place]; is one of 10 kids; 8 siblings still living; pt was the youngest of 4 boys; father was an Pensions consultant and CFO for various corporations; mom is 71 yo [place] woman and lives alone in [place]  Sexual Orientation: Straight  Relationship Status: married; met [name] while he was going  through his divorced (difficult);  Name of spouse / other: [name]; 22 yrs If a parent, number of children / ages: Dillion (31 yo daughter by previous marriage; lives in [place]); Carter-18 yo daughter; going to [place]; [place] yo son; finishing jr year at [employer]; [place]-61 yo; finishing her freshman year at [employer].  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[employer]; [name] has been  a professor in [employer] for her career  Financial planner: No   Educational History: Education: post Engineer, maintenance (IT) work or degree  Religion/Sprituality/World View: Catholic (raised); non-denominational now; [name]'s father is a 76 yo Optician, dispensing at a [employer] in [place]  Any cultural differences that may affect / interfere with treatment:  not applicable   Recreation/Hobbies: "I don't do a lot for fun."  Enjoys short trips with the family and enjoys eating out;   Stressors: Financial difficulties   Occupational concerns    Strengths: Friends and Church  Barriers:  none noted   Legal History: Pending legal issue / charges: The patient has no significant history of legal issues. History of legal issue / charges:  none  Medical History/Surgical History: reviewed Past Medical History:  Diagnosis Date   CAD (coronary artery disease) 07/13/2021   CCTA 06/2021: CAC score 260 (84th percentile); mLAD 70-99; mLAD FFR pos (0.60) Status post DES to the mid LAD 06/2021   Carotid stenosis 08/11/2021   Carotid US 08-25-21: Bilat ICA 1-39   CHF (congestive heart failure) (HCC)    Deafness in right ear    Family history of pancreatic cancer    Family history of prostate cancer    Family history of skin cancer    Fatty liver    High cholesterol    Hyperlipidemia LDL goal <70 08/20/2018   PAF (paroxysmal atrial fibrillation) (HCC) 05/06/2021   Pre-diabetes    Sleep apnea    Vitamin B 12 deficiency    Vitamin D deficiency     Past Surgical History:  Procedure Laterality Date   CARDIAC CATHETERIZATION     CORONARY STENT INTERVENTION N/A 07/15/2021   Procedure: CORONARY STENT INTERVENTION;  Surgeon: Lyn Records, MD;  Location: [facility] INVASIVE CV LAB;  Service: Cardiovascular;  Laterality: N/A;   CORONARY ULTRASOUND/IVUS N/A 07/15/2021   Procedure: Intravascular Ultrasound/IVUS;  Surgeon: Lyn Records, MD;  Location: [facility] INVASIVE CV LAB;  Service: Cardiovascular;  Laterality: N/A;    INSERT / REPLACE / REMOVE PACEMAKER     LEFT HEART CATH AND CORONARY ANGIOGRAPHY N/A 07/15/2021   Procedure: LEFT HEART CATH AND CORONARY ANGIOGRAPHY;  Surgeon: Lyn Records, MD;  Location: [facility] INVASIVE CV LAB;  Service: Cardiovascular;  Laterality: N/A;   LOOP RECORDER REMOVAL N/A 09/01/2021   Procedure: LOOP RECORDER REMOVAL;  Surgeon: Regan Lemming, MD;  Location: [facility] INVASIVE CV LAB;  Service: Cardiovascular;  Laterality: N/A;   NASAL SEPTUM SURGERY     PACEMAKER IMPLANT N/A 09/01/2021   Procedure: PACEMAKER IMPLANT;  Surgeon: Regan Lemming, MD;  Location: [facility] INVASIVE CV LAB;  Service: Cardiovascular;  Laterality: N/A;    Medications: Current Outpatient Medications  Medication Sig Dispense Refill   acetaminophen (TYLENOL) 500 MG tablet Take 500-1,000 mg by mouth every 6 (six)  hours as needed (pain.).     aspirin EC 81 MG tablet Take 1 tablet (81 mg total) by mouth daily. 90 tablet 3   aspirin-acetaminophen-caffeine (EXCEDRIN MIGRAINE) 250-250-65 MG tablet Take 1-2 tablets by mouth 2 (two) times daily as needed for headache.     clopidogrel (PLAVIX) 75 MG tablet Take 1 tablet (75 mg total) by mouth daily. 90 tablet 3   diltiazem (CARDIZEM) 30 MG tablet Take 1 tablet (30 mg total) by mouth 4 (four) times daily as needed. 30 tablet 3   ezetimibe (ZETIA) 10 MG tablet Take 1 tablet (10 mg total) by mouth daily. 90 tablet 3   ibuprofen (ADVIL) 200 MG tablet Take 400-600 mg by mouth every 8 (eight) hours as needed (pain.).     Multiple Vitamins-Minerals (MULTI FOR HIM) TABS Take 1 tablet by mouth in the morning.     nitroGLYCERIN (NITROSTAT) 0.4 MG SL tablet Place 1 tablet (0.4 mg total) under the tongue every 5 (five) minutes as needed for chest pain. 90 tablet 3   Polyethyl Glycol-Propyl Glycol (LUBRICANT EYE DROPS) 0.4-0.3 % SOLN Place 1-2 drops into both eyes 3 (three) times daily as needed (dry/irritated eyes.).     rosuvastatin (CRESTOR) 20 MG tablet Take 2 tablets (40 mg  total) by mouth daily. 180 tablet 3   Semaglutide-Weight Management (WEGOVY) 0.25 MG/0.5ML SOAJ Inject 0.25 mg into the skin once a week. (Patient not taking: Reported on 07/11/2022) 2 mL 0   No current facility-administered medications for this visit.    Allergies  Allergen Reactions   Bee Venom Anaphylaxis    Diagnoses:  Adjustment disorder with depressed mood  Plan of Care: Encouraged pt to think about what self care activities he wants to engage in and we will meet in 2 wks for a follow up session (07/25/22).   Karie Kirks, [facility]

## 2022-07-11 NOTE — Patient Instructions (Signed)
Steps to starting your WegovyT  The office staff will send a prior authorization request to your insurance company for approval. We will send you a mychart message once we hear back from your insurance with a decision.  This can take up to 7-10 business days.   Once your WegovyTis approved, you may then pick up Wegovy pen from your pharmacy.    Learn how to do Wegovy injections on the Wegovy.com website. There is a training video that will walk you through how to safely perform the injection. If you have questions for our clinical staff, please contact our  clinical staff. If you have any symptoms of allergic reaction to WegovyT discontinue immediately and call 911.  1. What should I tell my provider before using WegovyT ? have or have had problems with your pancreas or kidneys. have type 2 diabetes and a history of diabetic retinopathy. have or have had depression, suicidal thoughts, or mental health issues. are pregnant or plan to become pregnant. WegovyT may harm your unborn baby. You should stop using WegovyT 3 months before you plan to become pregnant or if you are breastfeeding or plan to breastfeed. It is not known if WegovyT passes into your breast milk.  2. What is WegovyT and how does it work?  WegovyT is an injectable prescription medication prescribed by your provider to help with your weight loss.  This medicine will be most effective when combined with a reduced calorie diet and physical activity.  WegovyT is not for the treatment of type 2 diabetes mellitus. WegovyT should not be used with other GLP-1 receptor agonist medicines. The addition of WegovyT in  patients treated with insulin has not been evaluated. When initiating WegovyT, consider reducing the dose of concomitantly administered insulin secretagogues (such as sulfonylureas) or insulin to reduce the risk of  hypoglycemia.  One role of GLP-1 is to send a signal to your brain to tell it you are full. It also slows down  stomach emptying which will make you feel full longer and may help with reducing cravings.   3.  How should I take WegovyT?  Administer WegovyT once weekly, on the same day each week, at any time of day, with or without meals Inject subcutaneously in the abdomen, thigh or upper arm Initiate at 0.25 mg once weekly for 4 weeks. In 4 week intervals, increase the dose until a dose of 2.4 mg is reached (we will discuss with you the dosage at each visit). The maintenance dose of WegovyT is 2.4 mg once weekly.  The dosing schedule of Wegovy is:  0.25 mg per week X 4 weeks 0.5 mg per week X 4 weeks 1.0 mg per week X 4 weeks 1.7 mg per week X 4 weeks 2.4 mg per week   Missed dose   If you miss your injection day, go ahead inject your current dose. You can go >7 days, but not <7 days between injections. You may change your injection day (It must be >7 days). If you miss >2 doses, you can still keep next injection dose the same or follow de-escalation schedule which may minimize GI symptoms.   In patients with type 2 diabetes, monitor blood glucose prior to starting and during WEGOVYT treatment.   Inject your dose of Wegovy under the skin (subcutaneous injection) in your stomach area (abdomen), upper leg (thigh) or upper arm. Do not inject into a vein or a muscle. The injection site should be rotated and not given in the   same spot each day. Hold the needle under the skin and count to "10". This will allow all of the medicine to be dispensed under the skin. Always wipe your skin with an alcohol prep pad before injection  Dispose of used pen in an approved sharps container. More practical options that can be put in the trash  to go to the landfill are milk jugs or plastic laundry detergent containers with a screw on lid.  What side effects may I notice from taking WegovyT?  Side effects that usually do not require medical attention (report to our office if they continue or are bothersome): Nausea  (most common but decreases over time in most people as their body gets used to the medicine) Diarrhea Constipation (you may take an over the counter laxative if needed) Headache Decreased appetite Upset stomach Tiredness Dizziness Feeling bloated Hair loss Belching Gas Heartburn  Side effects that you should call 911 as soon as possible Vomiting Stomach pain Fever Yellowing of your skin or eyes  Clay-colored stools Increased heart rate while at rest Low blood sugar  Sudden changes in mood, behaviors, thoughts, feelings, or thoughts of suicide If you get a lump or swelling in your neck, hoarseness, trouble swallowing, or shortness of breath. Allergic reaction such as skin rash, itching, hives, swelling of the face, tongue, or lips  Helpful tips for managing nausea Nausea is a common side effect when first starting WegovyT. If you experience nausea, be sure to connect with your health care provider. He or she will offer guidance on ways to manage it, which may include: Eat bland, low-fat foods, like crackers, toast and rice  Eat foods that contain water, like soups and gelatin  Avoid lying down after you eat  Go outdoors for fresh air  Eat more slowly    Other important information Do not drop your pen or knock it against hard surfaces  Do not expose your pen to any liquids  If you think that your pen may be damaged, do not try to fix it. Use a new one Keep the pen cap on until you are ready to inject. Your pen will no longer be sterile if you store an unused pen without the cap, if you pull the pen cap off and put it on again, or if the pen cap is missing. This could lead to an infection  Store the WegovyT pen in the refrigerator from 36F to 46F (2C to 8C) If needed, before removing the pen cap, WegovyT can be stored from 8C to 30C (46F to 86F) in the original carton for up to 28 days.  Keep WegovyT in the original carton to protect it from light  Do not freeze   Throw away pen if WegovyT has been frozen, has been exposed to light or temperatures above 86F (30C), or has been out of the refrigerator for 28 days or longer It's important to properly dispose of your used WegovyT pens. Do not throw the pen away in your household trash. Instead, use an FDA-cleared sharps disposable container or a sturdy household container with a tight-fitting lid, like a heavy duty plastic container.   Wegovy pen training website: https://www.wegovy.com/about-wegovy/how-to-use-the-wegovy-pen.html  Wegovy savings and support link: https://www.wegovy.com/saving-and-support/save-and-support.html    

## 2022-07-11 NOTE — Progress Notes (Signed)
Office: [phone]  /  Fax: [phone]  WEIGHT SUMMARY AND BIOMETRICS  No data recorded Weight Gained Since Last Visit: 2lb   Vitals Temp: 97.8 F (36.6 C) BP: 129/85 Pulse Rate: 85 SpO2: 98 %   Anthropometric Measurements Height: 5\' 6"  (1.676 m) Weight: 194 lb (88 kg) BMI (Calculated): 31.33 Weight at Last Visit: 192lb Weight Gained Since Last Visit: 2lb Starting Weight: 188lb Total Weight Loss (lbs): 0 lb (0 kg)   Body Composition  Body Fat %: 30 % Fat Mass (lbs): 58.2 lbs Muscle Mass (lbs): 129 lbs Total Body Water (lbs): 91 lbs Visceral Fat Rating : 16   Other Clinical Data Fasting: No Labs: No Today's Visit #: 8 Starting Date: 03/20/22     HPI  Chief Complaint: OBESITY  Cotter is here to discuss his progress with his obesity treatment plan. He is on the the Category 2 Plan and states he is following his eating plan approximately 70 % of the time. He states he is exercising 20-30 minutes 3-4 days per week.   Interval History:  Since last office visit he has gained 2 pounds.  His daughter is graduating this week.  The end of June he is going to [place] and in July he is going to [place] for a family reunion.  He is under some stress and finds he stress eating.  He is struggling with portion sizes and cravings.  He drinking water daily.  He is seeing a new therapist today.    BF:  [employer] egg bites & coffee Lunch:  hummus, carrots Dinner:  chicken or salmon or shrimp Snack:  yogurt, fruit, protein bars   Pharmacotherapy for weight loss: He is not currently taking medications  for medical weight loss but brought in [place] for [place] to show him how to use it prior to starting it.    Prediabetes Last A1c was 5.4  Medication(s): planning to start [facility] 0.25mg  next week Polyphagia:Yes Lab Results  Component Value Date   HGBA1C 5.4 02/27/2022   HGBA1C 5.9 05/06/2021   HGBA1C 5.5 03/30/2020   HGBA1C 5.6 08/14/2018   HGBA1C 5.8 (H)  11/11/2012   Lab Results  Component Value Date   INSULIN 25.9 (H) 03/20/2022    PHYSICAL EXAM:  Blood pressure 129/85, pulse 85, temperature 97.8 F (36.6 C), height 5\' 6"  (1.676 m), weight 194 lb (88 kg), SpO2 98 %. Body mass index is 31.31 kg/m.  General: He is overweight, cooperative, alert, well developed, and in no acute distress. PSYCH: Has normal mood, affect and thought process.   Extremities: No edema.  Neurologic: No gross sensory or motor deficits. No tremors or fasciculations noted.    DIAGNOSTIC DATA REVIEWED:  BMET    Component Value Date/Time   NA 142 03/20/2022 1011   K 4.6 03/20/2022 1011   CL 103 03/20/2022 1011   CO2 25 03/20/2022 1011   GLUCOSE 110 (H) 03/20/2022 1011   GLUCOSE 104 (H) 03/30/2020 1011   BUN 16 03/20/2022 1011   CREATININE 0.91 03/20/2022 1011   CALCIUM 9.4 03/20/2022 1011   GFRNONAA 93 11/11/2012 1000   GFRAA 108 11/11/2012 1000   Lab Results  Component Value Date   HGBA1C 5.4 02/27/2022   HGBA1C 5.8 (H) 11/11/2012   Lab Results  Component Value Date   INSULIN 25.9 (H) 03/20/2022   Lab Results  Component Value Date   TSH 1.550 03/20/2022   CBC    Component Value Date/Time   WBC 5.1 11/15/2021 1122  RBC 4.89 11/15/2021 1122   HGB 15.9 11/15/2021 1122   HGB 16.7 08/31/2021 1500   HCT 45.1 11/15/2021 1122   HCT 47.7 08/31/2021 1500   PLT 168.0 11/15/2021 1122   PLT 179 08/31/2021 1500   MCV 92.1 11/15/2021 1122   MCV 93 08/31/2021 1500   MCH 32.5 08/31/2021 1500   MCHC 35.2 11/15/2021 1122   RDW 12.5 11/15/2021 1122   RDW 14.2 08/31/2021 1500   Iron Studies No results found for: "IRON", "TIBC", "FERRITIN", "IRONPCTSAT" Lipid Panel     Component Value Date/Time   CHOL 116 01/03/2022 0842   TRIG 60 01/03/2022 0842   HDL 51 01/03/2022 0842   CHOLHDL 2.3 01/03/2022 0842   CHOLHDL 5 03/30/2020 1011   VLDL 24.2 03/30/2020 1011   LDLCALC 52 01/03/2022 0842   Hepatic Function Panel     Component Value  Date/Time   PROT 6.7 04/06/2022 0737   ALBUMIN 4.4 04/06/2022 0737   AST 28 04/06/2022 0737   ALT 39 04/06/2022 0737   ALKPHOS 100 04/06/2022 0737   BILITOT 0.3 04/06/2022 0737   BILIDIR 0.11 04/06/2022 0737      Component Value Date/Time   TSH 1.550 03/20/2022 1011   Nutritional Lab Results  Component Value Date   VD25OH 31.0 03/20/2022   VD25OH 26.15 (L) 05/06/2021   VD25OH 32.71 03/30/2020     ASSESSMENT AND PLAN  TREATMENT PLAN FOR OBESITY:  Recommended Dietary Goals  Gemayel is currently in the action stage of change. As such, his goal is to continue weight management plan. He has agreed to the Category 2 Plan.  Behavioral Intervention  We discussed the following Behavioral Modification Strategies today: increasing lean protein intake, decreasing simple carbohydrates , increasing vegetables, increasing lower glycemic fruits, increasing fiber rich foods, avoiding skipping meals, increasing water intake, continue to practice mindfulness when eating, and planning for success.  Additional resources provided today: NA  Recommended Physical Activity Goals  Rocco has been advised to work up to 150 minutes of moderate intensity aerobic activity a week and strengthening exercises 2-3 times per week for cardiovascular health, weight loss maintenance and preservation of muscle mass.   He has agreed to Continue current level of physical activity  and Think about ways to increase daily physical activity and overcoming barriers to exercise   Pharmacotherapy We discussed various medication options to help [place] with his weight loss efforts and we both agreed to start [facility] 0.25mg  next week.  Side effects discussed. All questions answered today.  Reviewed the pen and how to take [facility].    Avoid Phentermine and Qsymia due to cardiac history.   Avoid Orlistat due to Vit D def.    ASSOCIATED CONDITIONS ADDRESSED TODAY  Action/Plan  Prediabetes Raynen will continue to work on  weight loss, exercise, and decreasing simple carbohydrates to help decrease the risk of diabetes.    Plans to start [facility] next week.    Generalized obesity  BMI 31.0-31.9,adult         Return in about 2 weeks (around 07/25/2022).Marland Kitchen He was informed of the importance of frequent follow up visits to maximize his success with intensive lifestyle modifications for his multiple health conditions.   ATTESTASTION STATEMENTS:  Reviewed by clinician on day of visit: allergies, medications, problem list, medical history, surgical history, family history, social history, and previous encounter notes.   Time spent on visit including pre-visit chart review and post-visit care and charting was 30 minutes.    Theodis Sato. Gamaliel Charney  FNP-C

## 2022-07-25 ENCOUNTER — Ambulatory Visit (INDEPENDENT_AMBULATORY_CARE_PROVIDER_SITE_OTHER): Payer: No Typology Code available for payment source | Admitting: Psychology

## 2022-07-25 ENCOUNTER — Ambulatory Visit: Payer: No Typology Code available for payment source | Admitting: Bariatrics

## 2022-07-25 ENCOUNTER — Encounter: Payer: Self-pay | Admitting: Bariatrics

## 2022-07-25 VITALS — BP 96/58 | HR 82 | Temp 97.6°F | Ht 66.0 in | Wt 193.0 lb

## 2022-07-25 DIAGNOSIS — F4321 Adjustment disorder with depressed mood: Secondary | ICD-10-CM

## 2022-07-25 DIAGNOSIS — E669 Obesity, unspecified: Secondary | ICD-10-CM

## 2022-07-25 DIAGNOSIS — K760 Fatty (change of) liver, not elsewhere classified: Secondary | ICD-10-CM

## 2022-07-25 DIAGNOSIS — R7303 Prediabetes: Secondary | ICD-10-CM

## 2022-07-25 DIAGNOSIS — Z6831 Body mass index (BMI) 31.0-31.9, adult: Secondary | ICD-10-CM

## 2022-07-25 MED ORDER — WEGOVY 0.5 MG/0.5ML ~~LOC~~ SOAJ: 0.5000 mg | SUBCUTANEOUS | 0 refills | Status: DC

## 2022-07-25 NOTE — Progress Notes (Signed)
[facility] Counselor/Therapist Progress Note  Patient ID: Brandon Madden, MRN: [id number],    Date: 07/25/2022  Time Spent: 60 mins  Treatment Type: Individual Therapy  Reported Symptoms: Pt presents for session in person in the office, granting consent for the session.  Mental Status Exam: Appearance:  Casual     Behavior: Appropriate  Motor: Normal  Speech/Language:  Clear and Coherent  Affect: Appropriate  Mood: normal  Thought process: normal  Thought content:   WNL  Sensory/Perceptual disturbances:   WNL  Orientation: oriented to person, place, and time/date  Attention: Good  Concentration: Good  Memory: WNL  Fund of knowledge:  Good  Insight:   Good  Judgment:  Good  Impulse Control: Good   Risk Assessment: Danger to Self:  No Self-injurious Behavior: No Danger to Others: No Duty to Warn:no Physical Aggression / Violence:No  Access to Firearms a concern: No  Gang Involvement:No   Subjective: Talked with pt about how he felt about our first session.  Also talked with pt about how he felt about his heart conditions with his artery blockage and his pace maker.  Pt was disappointed that his wife and kids did not act more concerned when he passed out a year before getting his stent.  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  Pt is not sure how Brandon Madden feels about him; he wants to work on expressing more of his feelings to Brandon Madden and having her be more expressive to him.  Pt shares that he sleeps "so so"; pt shares that he is working on mindfulness exercises and trying to be present in more of his self care activities.  Talked with pt about deep breathing exercises to help him fall back to sleep at night when he wakes up.  He will practice between now and  our follow up session.  Encouraged pt to continue with his self care activities and we will meet in 3 wks for a follow up session.  Interventions: Cognitive Behavioral Therapy  Diagnosis:Adjustment disorder with depressed mood  Plan: Treatment Plan Strengths/Abilities:  Intelligent, Intuitive, Willing to participate in therapy Treatment Preferences:  Outpatient Individual Therapy Statement of Needs:  Patient is to use CBT, mindfulness and coping skills to help manage and/or decrease symptoms associated with their diagnosis. Symptoms:  Depressed/Irritable mood, worry, social withdrawal Problems Addressed:  Depressive thoughts, Sadness, Sleep issues, etc. Long Term Goals:  Pt to reduce overall level, frequency, and intensity of the feelings of depression as evidenced by decreased irritability, negative self talk, and helpless feelings from 6 to 7 days/week to 0 to 1 days/week, per client report, for at least 3 consecutive months.  Progress: 20% Short Term Goals:  Pt to verbally express understanding of the relationship between feelings of depression and their impact on thinking patterns and behaviors.  Pt to verbalize an understanding of the role that distorted thinking plays in creating fears, excessive worry, and ruminations.  Progress: 20% Target Date:  07/25/2023 Frequency:  Bi-weekly Modality:  Cognitive Behavioral Therapy Interventions by Therapist:  Therapist will use CBT, Mindfulness exercises, Coping skills and Referrals, as needed by client. Client has verbally approved this treatment plan.  Brandon Madden, [facility]

## 2022-07-25 NOTE — Progress Notes (Signed)
WEIGHT SUMMARY AND BIOMETRICS  Weight Lost Since Last Visit: 1lb   No data recorded Anthropometric Measurements Height: 5\' 6"  (1.676 m) Weight: 193 lb (87.5 kg) BMI (Calculated): 31.17 Weight at Last Visit: 194lb Weight Lost Since Last Visit: 1lb Starting Weight: 188lb Total Weight Loss (lbs): 0 lb (0 kg)   Body Composition  Body Fat %: 30.4 % Fat Mass (lbs): 58.8 lbs Muscle Mass (lbs): 128 lbs Total Body Water (lbs): 91 lbs Visceral Fat Rating : 16   Other Clinical Data Fasting: yes Labs: no Today's Visit #: 9 Starting Date: 03/20/22    OBESITY Brandon Madden is here to discuss his progress with his obesity treatment plan along with follow-up of his obesity related diagnoses.     Nutrition Plan: the Category 2 plan - 65% adherence.  Current exercise: walking and yard work  Interim History:  He is down 1 additional lb.  Eating all of the food on the plan., Protein intake is as prescribed, and Water intake is adequate.  Pharmacotherapy: Brandon Madden is on Wegovy 0.25 mg SQ weekly Adverse side effects: None Hunger is moderately controlled.  Cravings are moderately controlled.  Assessment/Plan:   1. Prediabetes  Last A1c was 5.4  Medication(s):  Wegovy 0.50 mg SQ weekly Lab Results  Component Value Date   HGBA1C 5.4 02/27/2022   HGBA1C 5.9 05/06/2021   HGBA1C 5.5 03/30/2020   HGBA1C 5.6 08/14/2018   HGBA1C 5.8 (H) 11/11/2012   Lab Results  Component Value Date   INSULIN 25.9 (H) 03/20/2022    Plan: Information sheet on " Insulin Resistance and Prediabetes".  Will minimize all refined carbohydrates both sweets and starches.  Will work on the plan and exercise.  Consider both aerobic and resistance training.  Will keep protein, water, and fiber intake high.  Increase Polyunsaturated and Monounsaturated fats to increase satiety and encourage weight loss.  Aim for 7 to 9 hours of sleep nightly.  Will continue medications.   Continue and increase  dose Wegovy 0.50 mg SQ weekly     2. Fatty Liver:  He was diagnosed with fatty liver disease in the past . Liver enzymes are normal.  He is working on diet and exercise measures to counter this.   Plan:  Will follow the plan and exercise. Exercise both as cardio and resistance have been proven to help decrease fatty liver.  Will minimize all carbohydrates both sweets and starches.      Generalized Obesity: Current BMI BMI (Calculated): 31.17   Pharmacotherapy Plan Continue and increase dose  Wegovy 0.50 mg SQ weekly  Brandon Madden is currently in the action stage of change. As such, his goal is to continue with weight loss efforts.  He has agreed to the Category 2 plan.  Exercise goals: For substantial health benefits, adults should do at least 150 minutes (2 hours and 30 minutes) a week of moderate-intensity, or 75 minutes (1 hour and 15 minutes) a week of vigorous-intensity aerobic physical activity, or an equivalent combination of moderate- and vigorous-intensity aerobic activity. Aerobic activity should be performed in episodes of at least 10 minutes, and preferably, it should be spread throughout the week.  Behavioral modification strategies: increasing lean protein intake, decreasing simple carbohydrates , no meal skipping, and meal planning .  Brandon Madden has agreed to follow-up with our clinic in 4 weeks.       Objective:   VITALS: Per patient if applicable, see vitals. GENERAL: Alert and in no acute distress. CARDIOPULMONARY: No increased WOB. Speaking in  clear sentences.  PSYCH: Pleasant and cooperative. Speech normal rate and rhythm. Affect is appropriate. Insight and judgement are appropriate. Attention is focused, linear, and appropriate.  NEURO: Oriented as arrived to appointment on time with no prompting.   Attestation Statements:    This was prepared with the assistance of Engineer, civil (consulting).  Occasional wrong-word or sound-a-like substitutions may have occurred due  to the inherent limitations of voice recognition software.   Corinna Capra, DO

## 2022-07-31 ENCOUNTER — Encounter: Payer: Self-pay | Admitting: Cardiology

## 2022-08-07 IMAGING — CT CT HEART MORP W/ CTA COR W/ SCORE W/ CA W/CM &/OR W/O CM
4 of 7 series · 8 of 20 positions shown, 9 images · non-contrast
Comparison: 06/04/2020 CTA chest
COMPARISON: 06/04/2020 CTA chest

Addendum:
EXAM:
OVER-READ INTERPRETATION  CT CHEST

The following report is a limited chest CT over-read performed by
07/11/2021. This over-read does not include interpretation of cardiac
or coronary anatomy or pathology. The cardiac CTA interpretation by
the cardiologist is attached.
CLINICAL DATA: Chest pain
Cardiac/Coronary CTA
TECHNIQUE: A non-contrast, gated CT scan was obtained with axial slices of 3 mm
through the heart for calcium scoring. Calcium scoring was performed
using the Agatston method. A 110 kV prospective, gated, contrast
cardiac scan was obtained. Gantry rotation speed was 250 msecs and
collimation was 0.6 mm. Two sublingual nitroglycerin tablets (0.8
mg) were given. The 3D data set was reconstructed in 5% intervals of
the 35-75% of the R-R cycle. Diastolic phases were analyzed on a
dedicated workstation using MPR, MIP, and VRT modes. The patient
received 95 cc of contrast.

[Series 6: ts syst sharp · axial · 0.39mm/px · z∈[-160,-124]mm · 2 of 268 slices shown]
[im 90/268  lung]
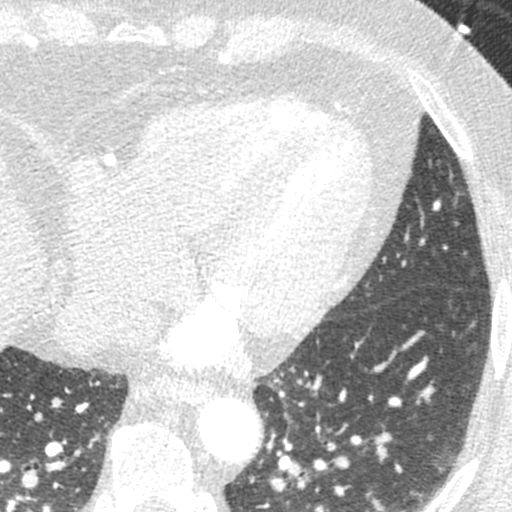
[im 179/268  lung]
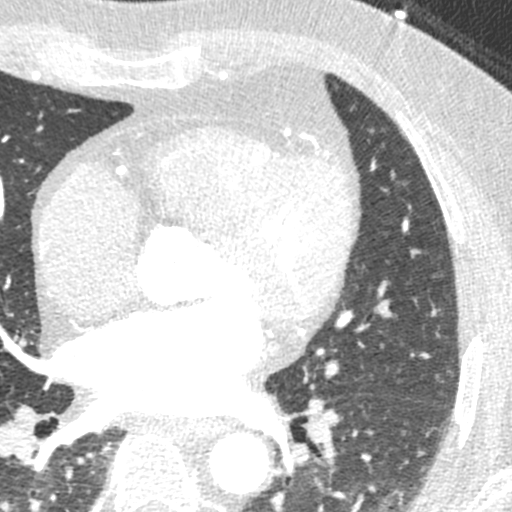

[Series 7: best syst · axial · 0.39mm/px · z∈[-160,-124]mm · 2 of 268 slices shown, 3 images]
[im 90/268  vessel]
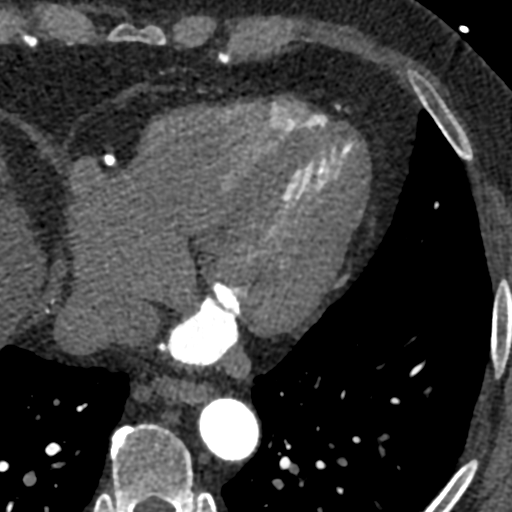
[im 90/268  lung]
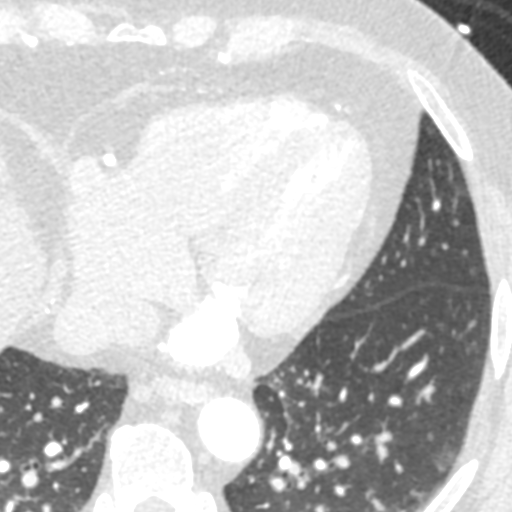
[im 179/268  vessel]
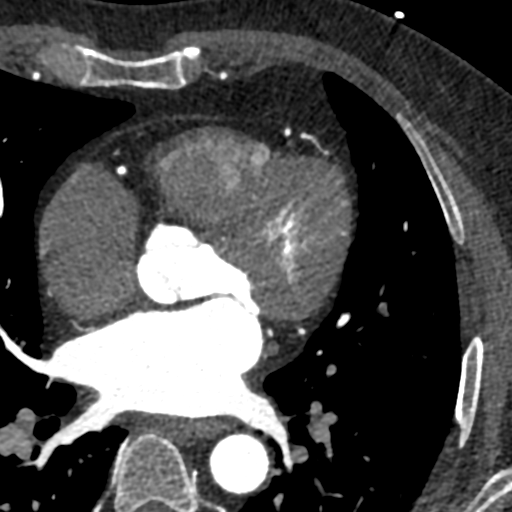

[Series 8: ts diast sharp · axial · 0.39mm/px · z∈[-160,-124]mm · 2 of 268 slices shown]
[im 90/268  lung]
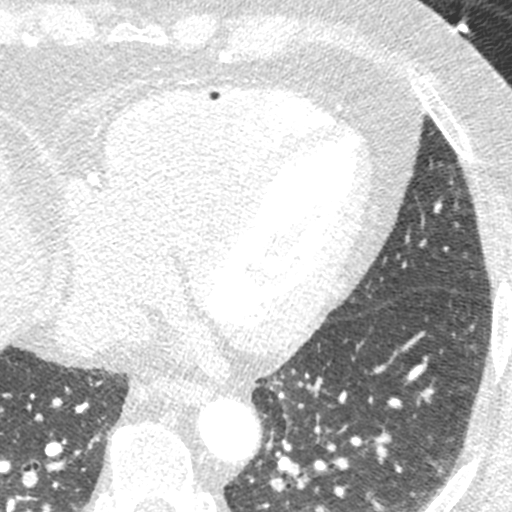
[im 179/268  lung]
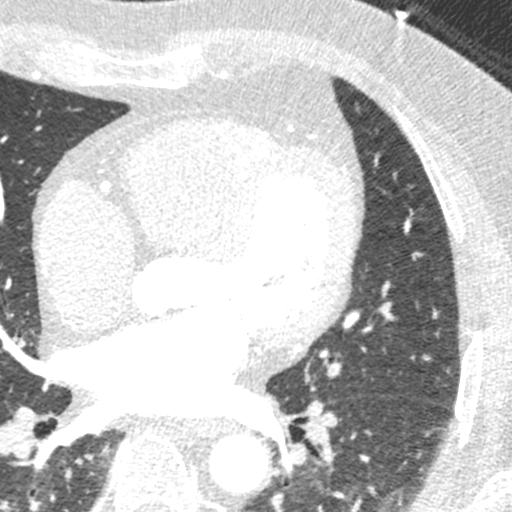

[Series 9: best diast · axial · 0.39mm/px · z∈[-160,-124]mm · 2 of 268 slices shown]
[im 90/268  vessel]
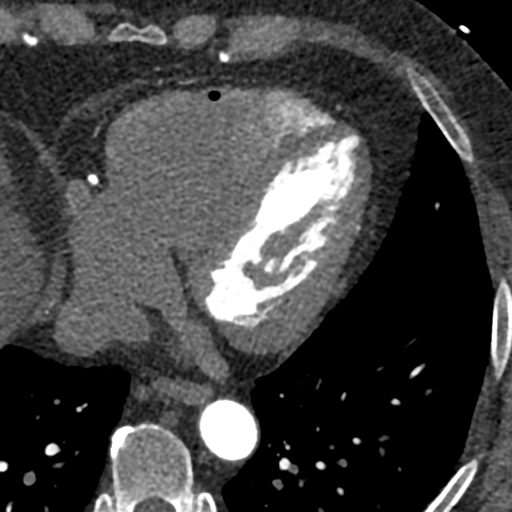
[im 179/268  vessel]
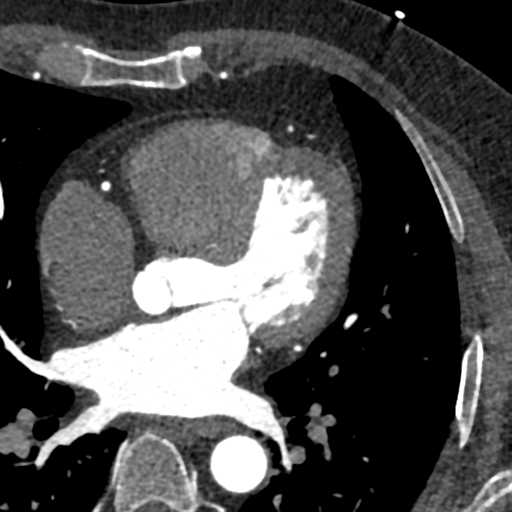

[8 of 20 positions shown; findings below may reference images not displayed]

FINDINGS: Vascular: Normal aortic caliber. No central pulmonary embolism, on
this non-dedicated study.

Mediastinum/Nodes: No imaged thoracic adenopathy.

Lungs/Pleura: No pleural fluid.  Clear imaged lungs.

Upper Abdomen: Mild hepatic steatosis. Normal imaged portions of the
spleen, stomach.

Musculoskeletal: No acute osseous abnormality.
IMPRESSION: No acute findings in the imaged extracardiac chest.

Mild hepatic steatosis.
FINDINGS: Image quality: Excellent.

Noise artifact is: Limited.

Coronary Arteries:  Normal coronary origin.  Right dominance.

Left main: The left main is a large caliber vessel with a normal
take off from the left coronary cusp that trifurcates into a LAD,
LCX, and ramus intermedius. There is no plaque or stenosis.

Left anterior descending artery: The proximal LAD contains mild
calcified plaque (25-49%). There is a severe non-calcified plaque
(70-99%) in the mid LAD. The distal LAD contains minimal
non-calcified plaque (<25%). The first diagonal contains minimal
calcified plaque (<25%). The second diagonal is patent.

Ramus intermedius: Patent with no evidence of plaque or stenosis.

Left circumflex artery: The LCX is non-dominant and patent with no
evidence of plaque or stenosis. The LCX gives off 1 obtuse marginal
branch with mild mixed density plaque (25-49%).

Right coronary artery: The RCA is dominant with normal take off from
the right coronary cusp. There is minimal calcified plaque (<25%) in
the proximal segment. The mid and distal segments are patent. The
RCA terminates as a PDA and right posterolateral branch without
evidence of plaque or stenosis.

Right Atrium: Right atrial size is within normal limits.

Right Ventricle: The right ventricular cavity is within normal
limits.

Left Atrium: Left atrial size is normal in size with no left atrial
appendage filling defect.

Left Ventricle: The ventricular cavity size is within normal limits.

Pulmonary arteries: Normal in size without proximal filling defect.

Pulmonary veins: Normal pulmonary venous drainage.

Pericardium: Normal thickness without significant effusion or
calcium present.

Cardiac valves: The aortic valve is trileaflet without significant
calcification. The mitral valve is normal without significant
calcification.

Aorta: Normal caliber without significant disease.

Extra-cardiac findings: See attached radiology report for
non-cardiac structures.
IMPRESSION: 1. Coronary calcium score of 260. This was 84th percentile for age-,
sex, and race-matched controls.

2. Normal coronary origin with right dominance.

3.  Severe mid LAD stenosis (70-99%).

4.  Mild mixed density plaque (25-49%) in the LCX.

5.  Minimal plaque in the RCA (<25%).

RECOMMENDATIONS:
1. Severe stenosis in the mid LAD. CT FFR will be submitted.
Consider symptom-guided anti-ischemic pharmacotherapy as well as
risk factor modification per guideline directed care. Invasive
coronary angiography recommended with revascularization per
published guideline statements.

*** End of Addendum ***
EXAM:
OVER-READ INTERPRETATION  CT CHEST

The following report is a limited chest CT over-read performed by
07/11/2021. This over-read does not include interpretation of cardiac
or coronary anatomy or pathology. The cardiac CTA interpretation by
the cardiologist is attached.
FINDINGS: Vascular: Normal aortic caliber. No central pulmonary embolism, on
this non-dedicated study.

Mediastinum/Nodes: No imaged thoracic adenopathy.

Lungs/Pleura: No pleural fluid.  Clear imaged lungs.

Upper Abdomen: Mild hepatic steatosis. Normal imaged portions of the
spleen, stomach.

Musculoskeletal: No acute osseous abnormality.
IMPRESSION: No acute findings in the imaged extracardiac chest.

Mild hepatic steatosis.

## 2022-08-08 NOTE — Progress Notes (Signed)
Cardiology Office Note:    Date:  08/15/2022   ID:  Brandon Madden, DOB Oct 30, 1960, MRN [id number]  PCP:  [place], Betty G, MD   [facility]  Cardiologist:  [name], MD  Advanced Practice Provider:  No care team member to display Electrophysiologist:  Will Jorja Loa, MD   Referring MD: [place], Betty G, MD    History of Present Illness:    Brandon Madden is a 62 y.o. male with a hx of congenital deafness, HLD and recent COVID infection who presents to clinic for follow-up.   Patient seen on 04/05/18 for non-exertional chest pain and palpitations. 2 week zio monitor without significant arrhythmias. Coronary calcium score 166 which was 80% for age and sex matched control.   Called clinic on 06/03/20 for episode of syncope. Specifically, the patient was sitting in a restaurant about to eat when he felt sudden onset of "feeling like he was going into a hole" with dizziness and profound fatigue. He then lost consciousness for 20-25seconds before returning back to baseline mental status. No preceding chest pain, palpitations, SOB. [place] and had completed an 11 mile bike ride the day before. Has had several similar episodes in the past but has not lost consciousness with them. Previously saw neuro where head imaging and EEG was negative. His TTE showed normal EF 55-60%, normal RV, no significant valve disease. CTA negative for PE/dissection. Symptoms sounded possibly vagal, however, given palpitations and syncope, we referred to EP.  Was seen by Dr. Elberta Fortis on 11/2020 and underwent linq implant which showed 22H episode of Afib. CHADs-vasc 1. No indication for [facility]. Was started on dilt 30mg  to start as needed.  Underwent CTA coronaries 06/2021 for atypical chest pain where he was found to have severe mid-LAD stenosis. Subsequent cath 07/15/21 confirmed 95% mid-LAD disease prompting PCI with excellent angiographic results.   Was seen back  in clinic after an episode of syncope where linq device revealed 10s pause prompting PPM placement with Dr. Elberta Fortis on 08/31/21.  He was seen in follow-up by Dr. Izora Ribas on 10/20/21 for more frequent palpitations. Interrogation of device revealed no episodes of Afib. He was started on metop 12.5mg  daily at that time.  Was last seen in clinic by Dr. Elberta Fortis on 05/2022 where he reported intermittent chest pain and palpitations. Chest pain was not exertional in nature and likely related to stress. Myoview in 01/2022 with inferior artifact but overall low risk.  Today, the patient overall feels well. He has several questions he would like to address. He is wondering if he can hold his plavix for his colonoscopy. He denies any exertional chest pain or SOB. Recent myoview with inferior artifact but no ischemia and was low risk.  He is also concerned about intermittent episodes of mild palpitations occurring several times per week lasting several minutes to less than an hour. These are not overly bothersome to him and no associated chest pain, SOB or lightheadedness. States he is rarely aware of when he is in Afib. We discussed that he can continue the dilt as needed or we can start something scheduled. He prefers the dilt as needed  Otherwise, no exertional chest pain and able to work in the yard for hours without issues. Has a lot of stress related to his job which causes him to have occasional chest tightness.   Past Medical History:  Diagnosis Date   CAD (coronary artery disease) 07/13/2021   CCTA 06/2021: CAC  score 260 (84th percentile); mLAD 70-99; mLAD FFR pos (0.60) Status post DES to the mid LAD 06/2021   Carotid stenosis 08/11/2021   Carotid US 07/2021: Bilat ICA 1-39   CHF (congestive heart failure) (HCC)    Deafness in right ear    Family history of pancreatic cancer    Family history of prostate cancer    Family history of skin cancer    Fatty liver    High cholesterol     Hyperlipidemia LDL goal <70 08/20/2018   PAF (paroxysmal atrial fibrillation) (HCC) 05/06/2021   Pre-diabetes    Sleep apnea    Vitamin B 12 deficiency    Vitamin D deficiency     Past Surgical History:  Procedure Laterality Date   CARDIAC CATHETERIZATION     CORONARY STENT INTERVENTION N/A 07/15/2021   Procedure: CORONARY STENT INTERVENTION;  Surgeon: Lyn Records, MD;  Location: [facility] INVASIVE CV LAB;  Service: Cardiovascular;  Laterality: N/A;   CORONARY ULTRASOUND/IVUS N/A 07/15/2021   Procedure: Intravascular Ultrasound/IVUS;  Surgeon: Lyn Records, MD;  Location: [facility] INVASIVE CV LAB;  Service: Cardiovascular;  Laterality: N/A;   INSERT / REPLACE / REMOVE PACEMAKER     LEFT HEART CATH AND CORONARY ANGIOGRAPHY N/A 07/15/2021   Procedure: LEFT HEART CATH AND CORONARY ANGIOGRAPHY;  Surgeon: Lyn Records, MD;  Location: [facility] INVASIVE CV LAB;  Service: Cardiovascular;  Laterality: N/A;   LOOP RECORDER REMOVAL N/A 09/01/2021   Procedure: LOOP RECORDER REMOVAL;  Surgeon: Regan Lemming, MD;  Location: [facility] INVASIVE CV LAB;  Service: Cardiovascular;  Laterality: N/A;   NASAL SEPTUM SURGERY     PACEMAKER IMPLANT N/A 09/01/2021   Procedure: PACEMAKER IMPLANT;  Surgeon: Regan Lemming, MD;  Location: [facility] INVASIVE CV LAB;  Service: Cardiovascular;  Laterality: N/A;    Current Medications: Current Meds  Medication Sig   acetaminophen (TYLENOL) 500 MG tablet Take 500-1,000 mg by mouth every 6 (six) hours as needed (pain.).   aspirin-acetaminophen-caffeine (EXCEDRIN MIGRAINE) 250-250-65 MG tablet Take 1-2 tablets by mouth 2 (two) times daily as needed for headache.   clopidogrel (PLAVIX) 75 MG tablet Take 1 tablet (75 mg total) by mouth daily.   diltiazem (CARDIZEM) 30 MG tablet Take 1 tablet (30 mg total) by mouth 4 (four) times daily as needed.   ezetimibe (ZETIA) 10 MG tablet Take 1 tablet (10 mg total) by mouth daily.   ibuprofen (ADVIL) 200 MG tablet Take 400-600 mg by mouth  every 8 (eight) hours as needed (pain.).   Multiple Vitamins-Minerals (MULTI FOR HIM) TABS Take 1 tablet by mouth in the morning.   Polyethyl Glycol-Propyl Glycol (LUBRICANT EYE DROPS) 0.4-0.3 % SOLN Place 1-2 drops into both eyes 3 (three) times daily as needed (dry/irritated eyes.).   rosuvastatin (CRESTOR) 40 MG tablet Take 1 tablet (40 mg total) by mouth daily.   Semaglutide-Weight Management (WEGOVY) 0.5 MG/0.5ML SOAJ Inject 0.5 mg into the skin once a week.   [DISCONTINUED] aspirin EC 81 MG tablet Take 1 tablet (81 mg total) by mouth daily.   [DISCONTINUED] rosuvastatin (CRESTOR) 20 MG tablet Take 2 tablets (40 mg total) by mouth daily.     Allergies:   Bee venom   Social History   Socioeconomic History   Marital status: Married    Spouse name: [name]   Number of children: 4   Years of education: Law   Highest education level: Professional school degree (e.g., MD, DDS, DVM, JD)  Occupational History   Occupation: Clinical cytogeneticist  Comment: [employer]  Tobacco Use   Smoking status: Never   Smokeless tobacco: Never  Vaping Use   Vaping Use: Never used  Substance and Sexual Activity   Alcohol use: No    Comment: quit: 2011   Drug use: No   Sexual activity: Yes  Other Topics Concern   Not on file  Social History Narrative   Right handed   Patient lives at home with his family.   Caffeine Use: 2 cups daily   Social Determinants of Health   Financial Resource Strain: Not on file  Food Insecurity: Not on file  Transportation Needs: Not on file  Physical Activity: Not on file  Stress: Not on file  Social Connections: Not on file     Family History: The patient's family history includes Heart attack in his father; High Cholesterol in his father and mother; High blood pressure in his father; Pancreatic cancer in his maternal grandmother; Prostate cancer in his brother and brother; Skin cancer in his sister; Sudden death in his father.  ROS:   Please see the history of present  illness.      EKGs/Labs/Other Studies Reviewed:    The following studies were reviewed today: Myoview 01/2022: The study is normal. The study is low risk.   No ST deviation was noted.   LV perfusion is normal. There is no evidence of ischemia. There is no evidence of infarction.   Left ventricular function is normal. Nuclear stress EF: 61 %. The left ventricular ejection fraction is normal (55-65%). End diastolic cavity size is normal. End systolic cavity size is normal.   Prior study not available for comparison.   Fixed inferior perfusion defect with normal wall motion, consistent with artifact Low risk study  Pacemaker Implant, ILR Removal  09/01/2021: CONCLUSIONS:   1. Successful implantation of a Medtronic Azure XT DR MRI SureScan dual-chamber pacemaker for symptomatic bradycardia  2. No early apparent complications.   Bilateral Carotid Doppler  08/10/2021: Summary:  Right Carotid: Velocities in the right ICA are consistent with a 1-39%  stenosis.   Left Carotid: Velocities in the left ICA are consistent with a 1-39%  stenosis.                Non-hemodynamically significant plaque <50% noted in the  CCA.   Vertebrals:  Bilateral vertebral arteries demonstrate antegrade flow.  Subclavians: Normal flow hemodynamics were seen in bilateral subclavian arteries.   LHC 07/15/21: CONCLUSIONS: 95% mid LAD treated with IVUS guided stent implantation reducing stenosis to 0% with TIMI grade III flow using an 18 x 2.75 Onyx postdilated to 3.25 mm in diameter. The proximal to mid LAD contains 30% narrowing Left main is normal Circumflex is normal Right coronary is dominant and normal LV function is normal with LVEDP 15 mmHg.  EF 55%.   RECOMMENDATIONS:   Aspirin and Plavix x6 months.  After 6 months can de-escalate to monotherapy with either aspirin or Plavix. Aggressive risk factor modification. Outpatient status.  CTA Chest 06/04/2020: FINDINGS: Cardiovascular: Heart size  normal. No pericardial effusion. Satisfactory opacification of pulmonary arteries noted, and there is no evidence of pulmonary emboli. Scattered coronary calcifications. Fair contrast opacification of the thoracic aorta with no evidence of dissection, aneurysm, or stenosis. There is classic 3-vessel brachiocephalic arch anatomy without proximal stenosis. Visualized proximal abdominal aorta unremarkable.   Mediastinum/Nodes: No mass or adenopathy. Calcified left hilar lymph node.   Lungs/Pleura: No pleural effusion. No pneumothorax. 4 mm calcified granuloma, anterior left upper lobe. Lungs  are otherwise clear.   Upper Abdomen: Scattered diverticula noted near the splenic flexure. No acute findings.   Musculoskeletal: No chest wall abnormality. No acute or significant osseous findings.   Review of the MIP images confirms the above findings.   IMPRESSION: 1. Negative for acute PE or thoracic aortic dissection. 2. Coronary calcifications. The severity of coronary artery disease and any potential stenosis cannot be assessed on this non-gated CT examination.  Echo 06/03/2020:  1. Left ventricular ejection fraction, by estimation, is 55 to 60%. The  left ventricle has normal function. The left ventricle has no regional  wall motion abnormalities. Left ventricular diastolic parameters are  consistent with Grade I diastolic  dysfunction (impaired relaxation).   2. Right ventricular systolic function is normal. The right ventricular  size is normal.   3. The mitral valve is normal in structure. Trivial mitral valve  regurgitation.   4. The aortic valve is normal in structure. There is mild thickening of  the aortic valve. Aortic valve regurgitation is trivial. No aortic  stenosis is present.   Comparison(s): No prior Echocardiogram.   Cardiac CT scan 04/30/20: FINDINGS: Non-cardiac: See separate report from [facility].   Ascending Aorta: Normal caliber. No  calcifications.   Pericardium: Normal   Coronary arteries: Normal coronary origins.   IMPRESSION: Coronary calcium score of 166. This was 80th percentile for age and sex matched control.  Cardiac monitor 04/27/20: Patch wear time was 13 days and 9 hours Predominant rhythm was NSR with average HR 84 (ranged from 48-172bpm) Rare SVE, rare PVCs <1% No Afib, significant pauses or sustained arrhythmias Triggered events mainly correlated with PACs. Overall, normal cardiac monitor  EKG:  No new tracing   Recent Labs: 10/21/2021: Magnesium 2.1 11/15/2021: Hemoglobin 15.9; Platelets 168.0 02/03/2022: NT-Pro BNP <36 03/20/2022: BUN 16; Creatinine, Ser 0.91; Potassium 4.6; Sodium 142; TSH 1.550 04/06/2022: ALT 39   Recent Lipid Panel    Component Value Date/Time   CHOL 116 01/03/2022 0842   TRIG 60 01/03/2022 0842   HDL 51 01/03/2022 0842   CHOLHDL 2.3 01/03/2022 0842   CHOLHDL 5 03/30/2020 1011   VLDL 24.2 03/30/2020 1011   LDLCALC 52 01/03/2022 0842     Physical Exam:    VS:  BP 124/74   Pulse 91   Ht 5\' 6"  (1.676 m)   Wt 202 lb (91.6 kg)   SpO2 97%   BMI 32.60 kg/m     Wt Readings from Last 3 Encounters:  08/15/22 202 lb (91.6 kg)  07/25/22 193 lb (87.5 kg)  07/11/22 194 lb (88 kg)     GEN:  Well nourished, well developed in no acute distress HEENT: Normal NECK: No JVD; No carotid bruits.  CARDIAC: RRR, no murmurs RESPIRATORY:  Clear to auscultation without rales, wheezing or rhonchi  ABDOMEN: Soft, non-tender, non-distended MUSCULOSKELETAL:  No edema; No deformity  SKIN: Warm and dry NEUROLOGIC:  Alert and oriented x 3  ASSESSMENT:    1. Coronary artery disease involving native coronary artery of native heart without angina pectoris   2. PAF (paroxysmal atrial fibrillation) (HCC)   3. SVT (supraventricular tachycardia)   4. Hyperlipidemia LDL goal <70   5. Cardiac pacemaker in situ   6. Syncope and collapse      PLAN:    In order of problems listed  above:  #CAD s/p mid-LAD PCI: Coronary CTA 06/2021 revealed mid-LAD stenosis with positive FFR. Subsequent cath 07/15/21 confirmed 95% mid-LAD stenosis now s/p PCI. Myoview 01/2022 low  risk. Will continue with medical therapy. -Stop ASA as completed 1 year of DAPT -Continue plavix 75mg  daily (okay to hold for colonoscopy) -Continue crestor 40mg  daily -Continue zetia 10mg  daily  #Paroxysmal Afib: Diagnosed on Linq recorder with 22 hour episode. CHADs-vasc 1 with no indication for [facility] at this time. Followed by Dr. Elberta Fortis -Continue dilt 30mg  as needed -Not on [facility] due to CHADs-vasc 1  #Syncope with 10s sinus pause: Found to have 10s pause on Linq monitor that was implanted for work-up of syncope. He is now s/p PPM placement with Dr. Elberta Fortis.  -Follow-up with Dr. Elberta Fortis as scheduled   #HLD: -Continue crestor 40mg  daily -Continue zetia 10mg  daily -Will follow-up repeat lipids with goal LDL<70 -Lifestyle modifications as below  #Obesity: -BMI 32.6 -Continue wegovy -Lifestyle modifications  Exercise recommendations: Goal of exercising for at least 30 minutes a day, at least 5 times per week.  Please exercise to a moderate exertion.  This means that while exercising it is difficult to speak in full sentences, however you are not so short of breath that you feel you must stop, and not so comfortable that you can carry on a full conversation.  Exertion level should be approximately a 5/10, if 10 is the most exertion you can perform.  Diet recommendations: Recommend a heart healthy diet such as the Mediterranean diet.  This diet consists of plant based foods, healthy fats, lean meats, olive oil.  It suggests limiting the intake of simple carbohydrates such as white breads, pastries, and pastas.  It also limits the amount of red meat, wine, and dairy products such as cheese that one should consume on a daily basis.    Medication Adjustments/Labs and Tests Ordered: Current medicines are  reviewed at length with the patient today.  Concerns regarding medicines are outlined above.   No orders of the defined types were placed in this encounter.  Meds ordered this encounter  Medications   rosuvastatin (CRESTOR) 40 MG tablet    Sig: Take 1 tablet (40 mg total) by mouth daily.    Dispense:  90 tablet    Refill:  3   Patient Instructions  Medication Instructions:   STOP TAKING ASPIRIN NOW  *If you need a refill on your cardiac medications before your next appointment, please call your pharmacy*   Follow-Up: At [facility], you and your health needs are our priority.  As part of our continuing mission to provide you with exceptional heart care, we have created designated Provider Care Teams.  These Care Teams include your primary Cardiologist (physician) and Advanced Practice Providers (APPs -  Physician Assistants and Nurse Practitioners) who all work together to provide you with the care you need, when you need it.  We recommend signing up for the patient portal called "MyChart".  Sign up information is provided on this After Visit Summary.  MyChart is used to connect with patients for Virtual Visits (Telemedicine).  Patients are able to view lab/test results, encounter notes, upcoming appointments, etc.  Non-urgent messages can be sent to your provider as well.   To learn more about what you can do with MyChart, go to [email or web address].    Your next appointment:   6 month(s)  Provider:   Jodelle Red, MD         Signed, [name], MD  08/15/2022 9:46 AM    [facility]

## 2022-08-09 ENCOUNTER — Telehealth: Payer: Self-pay | Admitting: *Deleted

## 2022-08-09 NOTE — Telephone Encounter (Signed)
   Pre-operative Risk Assessment    Patient Name: Brandon Madden  DOB: 1960/04/11 MRN: [id number]      Request for Surgical Clearance    Procedure:   COLONOSCOPY  Date of Surgery:  Clearance 08/22/22                                 Surgeon:  DR. Charlott Rakes Surgeon's Group or Practice Name:  [facility] Phone number:  [phone] Fax number:  [phone]   Type of Clearance Requested:   - Medical  - Pharmacy:  Hold Clopidogrel (Plavix) NOT INDICATED   Type of Anesthesia:   PROPOFOL   Additional requests/questions:    Wilhemina Cash   08/09/2022, 9:40 AM

## 2022-08-09 NOTE — Telephone Encounter (Signed)
   Name: Brandon Madden  DOB: 1961-02-20  MRN: [id number]  Primary Cardiologist: [name], MD  Chart reviewed as part of pre-operative protocol coverage. The patient has an upcoming visit scheduled with Dr. Shari Prows on 08/15/2022 at which time clearance can be addressed in case there are any issues that would impact surgical recommendations.  Colonoscopy is not scheduled until 08/22/2022 as below. I added preop FYI to appointment note so that provider is aware to address at time of outpatient visit.  Per office protocol the cardiology provider should forward their finalized clearance decision and recommendations regarding antiplatelet therapy to the requesting party below.    I will route this message as FYI to requesting party and remove this message from the preop box as separate preop APP input not needed at this time.   Please call with any questions.  Joylene Grapes, NP  08/09/2022, 11:50 AM

## 2022-08-15 ENCOUNTER — Ambulatory Visit: Payer: No Typology Code available for payment source | Attending: Cardiology | Admitting: Cardiology

## 2022-08-15 ENCOUNTER — Encounter: Payer: Self-pay | Admitting: Cardiology

## 2022-08-15 VITALS — BP 124/74 | HR 91 | Ht 66.0 in | Wt 202.0 lb

## 2022-08-15 DIAGNOSIS — R55 Syncope and collapse: Secondary | ICD-10-CM

## 2022-08-15 DIAGNOSIS — E785 Hyperlipidemia, unspecified: Secondary | ICD-10-CM

## 2022-08-15 DIAGNOSIS — Z950 Presence of cardiac pacemaker: Secondary | ICD-10-CM

## 2022-08-15 DIAGNOSIS — I471 Supraventricular tachycardia, unspecified: Secondary | ICD-10-CM

## 2022-08-15 DIAGNOSIS — I2510 Atherosclerotic heart disease of native coronary artery without angina pectoris: Secondary | ICD-10-CM | POA: Diagnosis not present

## 2022-08-15 DIAGNOSIS — I480 Paroxysmal atrial fibrillation: Secondary | ICD-10-CM

## 2022-08-15 MED ORDER — ROSUVASTATIN CALCIUM 40 MG PO TABS: 40.0000 mg | ORAL_TABLET | Freq: Every day | ORAL | 3 refills | Status: DC

## 2022-08-15 NOTE — Patient Instructions (Signed)
Medication Instructions:   STOP TAKING ASPIRIN NOW  *If you need a refill on your cardiac medications before your next appointment, please call your pharmacy*   Follow-Up: At [facility], you and your health needs are our priority.  As part of our continuing mission to provide you with exceptional heart care, we have created designated Provider Care Teams.  These Care Teams include your primary Cardiologist (physician) and Advanced Practice Providers (APPs -  Physician Assistants and Nurse Practitioners) who all work together to provide you with the care you need, when you need it.  We recommend signing up for the patient portal called "MyChart".  Sign up information is provided on this After Visit Summary.  MyChart is used to connect with patients for Virtual Visits (Telemedicine).  Patients are able to view lab/test results, encounter notes, upcoming appointments, etc.  Non-urgent messages can be sent to your provider as well.   To learn more about what you can do with MyChart, go to [email or web address].    Your next appointment:   6 month(s)  Provider:   Jodelle Red, MD

## 2022-08-15 NOTE — Telephone Encounter (Signed)
Darald, Uzzle - 08/09/2022  9:39 AM [name], MD  Sent: Tue August 15, 2022  9:26 AM  To: Loa Socks, LPN         Message  Okay to hold plavix for colonoscopy. No further CV testing needed prior to the procedure.

## 2022-08-15 NOTE — Telephone Encounter (Signed)
     Primary Cardiologist: [name], MD  Chart reviewed as part of pre-operative protocol coverage. Given past medical history and time since last visit, based on ACC/AHA guidelines, Brandon Madden would be at acceptable risk for the planned procedure without further cardiovascular testing.   His Plavix may be held for 5 days prior to his procedure.  Please resume as soon as hemostasis is achieved  I will route this recommendation to the requesting party via Epic fax function and remove from pre-op pool.  Please call with questions.  Thomasene Ripple. Anuar Walgren NP-C     08/15/2022, 9:50 AM [facility] [address] Office [phone] Fax [phone]

## 2022-08-17 ENCOUNTER — Ambulatory Visit: Payer: No Typology Code available for payment source | Admitting: Psychology

## 2022-08-17 DIAGNOSIS — F4321 Adjustment disorder with depressed mood: Secondary | ICD-10-CM

## 2022-08-17 NOTE — Progress Notes (Signed)
[facility] Counselor/Therapist Progress Note  Patient ID: Brandon Madden, MRN: [id number],    Date: 08/17/2022  Time Spent: 60 mins; start time: 1606; end time: 1708  Treatment Type: Individual Therapy  Reported Symptoms: Pt presents for session in person in the office, granting consent for the session.  Mental Status Exam: Appearance:  Casual     Behavior: Appropriate  Motor: Normal  Speech/Language:  Clear and Coherent  Affect: Appropriate  Mood: normal  Thought process: normal  Thought content:   WNL  Sensory/Perceptual disturbances:   WNL  Orientation: oriented to person, place, and time/date  Attention: Good  Concentration: Good  Memory: WNL  Fund of knowledge:  Good  Insight:   Good  Judgment:  Good  Impulse Control: Good   Risk Assessment: Danger to Self:  No Self-injurious Behavior: No Danger to Others: No Duty to Warn:no Physical Aggression / Violence:No  Access to Firearms a concern: No  Gang Involvement:No   Subjective: Pt shares that he has been pretty good since our last session.  "I recently saw my cardiologist and got a favorable report; that was very good for me."  Pt has come to learn that he does not want to continue working as much.  He feels really good about the report from his cardiologist and knows now that he is on a good path.  He also had a conversation with a good friend who has known him most of pt's life and friend told him how much he appreciated pt as a friend and that was very meaningful for pt.  Pt also had the chance to talk one on one with his son Brandon Madden) when they recently went to [facility] for a camp and that was meaningful.  Pt recently visit his brother who is very wealthy to attend his nephew's graduation and he really enjoyed the visit.  Pt is able to see that all of these things as things to be grateful for and to use them as a filter through which he can view life.  Pt understands the need for this tool.   Encouraged pt to continue with his self care activities and we will meet in 6 wks for a follow up session, due to trips pt has planned in July.  Interventions: Cognitive Behavioral Therapy  Diagnosis:Adjustment disorder with depressed mood  Plan: Treatment Plan Strengths/Abilities:  Intelligent, Intuitive, Willing to participate in therapy Treatment Preferences:  Outpatient Individual Therapy Statement of Needs:  Patient is to use CBT, mindfulness and coping skills to help manage and/or decrease symptoms associated with their diagnosis. Symptoms:  Depressed/Irritable mood, worry, social withdrawal Problems Addressed:  Depressive thoughts, Sadness, Sleep issues, etc. Long Term Goals:  Pt to reduce overall level, frequency, and intensity of the feelings of depression as evidenced by decreased irritability, negative self talk, and helpless feelings from 6 to 7 days/week to 0 to 1 days/week, per client report, for at least 3 consecutive months.  Progress: 20% Short Term Goals:  Pt to verbally express understanding of the relationship between feelings of depression and their impact on thinking patterns and behaviors.  Pt to verbalize an understanding of the role that distorted thinking plays in creating fears, excessive worry, and ruminations.  Progress: 20% Target Date:  07/25/2023 Frequency:  Bi-weekly Modality:  Cognitive Behavioral Therapy Interventions by Therapist:  Therapist will use CBT, Mindfulness exercises, Coping skills and Referrals, as needed by client. Client has verbally approved this treatment plan.  Karie Kirks, [facility]

## 2022-08-22 ENCOUNTER — Encounter: Payer: Self-pay | Admitting: Nurse Practitioner

## 2022-08-22 ENCOUNTER — Ambulatory Visit: Payer: No Typology Code available for payment source | Admitting: Nurse Practitioner

## 2022-08-22 VITALS — BP 114/76 | HR 91 | Temp 98.0°F | Ht 66.0 in | Wt 188.0 lb

## 2022-08-22 DIAGNOSIS — Z6830 Body mass index (BMI) 30.0-30.9, adult: Secondary | ICD-10-CM

## 2022-08-22 DIAGNOSIS — E669 Obesity, unspecified: Secondary | ICD-10-CM | POA: Diagnosis not present

## 2022-08-22 DIAGNOSIS — R7303 Prediabetes: Secondary | ICD-10-CM | POA: Diagnosis not present

## 2022-08-22 LAB — HM COLONOSCOPY

## 2022-08-22 NOTE — Patient Instructions (Signed)
Steps to starting your WegovyT  The office staff will send a prior authorization request to your insurance company for approval. We will send you a mychart message once we hear back from your insurance with a decision.  This can take up to 7-10 business days.   Once your WegovyTis approved, you may then pick up Wegovy pen from your pharmacy.    Learn how to do Wegovy injections on the Wegovy.com website. There is a training video that will walk you through how to safely perform the injection. If you have questions for our clinical staff, please contact our  clinical staff. If you have any symptoms of allergic reaction to WegovyT discontinue immediately and call 911.  1. What should I tell my provider before using WegovyT ? have or have had problems with your pancreas or kidneys. have type 2 diabetes and a history of diabetic retinopathy. have or have had depression, suicidal thoughts, or mental health issues. are pregnant or plan to become pregnant. WegovyT may harm your unborn baby. You should stop using WegovyT 3 months before you plan to become pregnant or if you are breastfeeding or plan to breastfeed. It is not known if WegovyT passes into your breast milk.  2. What is WegovyT and how does it work?  WegovyT is an injectable prescription medication prescribed by your provider to help with your weight loss.  This medicine will be most effective when combined with a reduced calorie diet and physical activity.  WegovyT is not for the treatment of type 2 diabetes mellitus. WegovyT should not be used with other GLP-1 receptor agonist medicines. The addition of WegovyT in  patients treated with insulin has not been evaluated. When initiating WegovyT, consider reducing the dose of concomitantly administered insulin secretagogues (such as sulfonylureas) or insulin to reduce the risk of  hypoglycemia.  One role of GLP-1 is to send a signal to your brain to tell it you are full. It also slows down  stomach emptying which will make you feel full longer and may help with reducing cravings.   3.  How should I take WegovyT?  Administer WegovyT once weekly, on the same day each week, at any time of day, with or without meals Inject subcutaneously in the abdomen, thigh or upper arm Initiate at 0.25 mg once weekly for 4 weeks. In 4 week intervals, increase the dose until a dose of 2.4 mg is reached (we will discuss with you the dosage at each visit). The maintenance dose of WegovyT is 2.4 mg once weekly.  The dosing schedule of Wegovy is:  0.25 mg per week X 4 weeks 0.5 mg per week X 4 weeks 1.0 mg per week X 4 weeks 1.7 mg per week X 4 weeks 2.4 mg per week   Missed dose   If you miss your injection day, go ahead inject your current dose. You can go >7 days, but not <7 days between injections. You may change your injection day (It must be >7 days). If you miss >2 doses, you can still keep next injection dose the same or follow de-escalation schedule which may minimize GI symptoms.   In patients with type 2 diabetes, monitor blood glucose prior to starting and during WEGOVYT treatment.   Inject your dose of Wegovy under the skin (subcutaneous injection) in your stomach area (abdomen), upper leg (thigh) or upper arm. Do not inject into a vein or a muscle. The injection site should be rotated and not given in the   same spot each day. Hold the needle under the skin and count to "10". This will allow all of the medicine to be dispensed under the skin. Always wipe your skin with an alcohol prep pad before injection  Dispose of used pen in an approved sharps container. More practical options that can be put in the trash  to go to the landfill are milk jugs or plastic laundry detergent containers with a screw on lid.  What side effects may I notice from taking WegovyT?  Side effects that usually do not require medical attention (report to our office if they continue or are bothersome): Nausea  (most common but decreases over time in most people as their body gets used to the medicine) Diarrhea Constipation (you may take an over the counter laxative if needed) Headache Decreased appetite Upset stomach Tiredness Dizziness Feeling bloated Hair loss Belching Gas Heartburn  Side effects that you should call 911 as soon as possible Vomiting Stomach pain Fever Yellowing of your skin or eyes  Clay-colored stools Increased heart rate while at rest Low blood sugar  Sudden changes in mood, behaviors, thoughts, feelings, or thoughts of suicide If you get a lump or swelling in your neck, hoarseness, trouble swallowing, or shortness of breath. Allergic reaction such as skin rash, itching, hives, swelling of the face, tongue, or lips  Helpful tips for managing nausea Nausea is a common side effect when first starting WegovyT. If you experience nausea, be sure to connect with your health care provider. He or she will offer guidance on ways to manage it, which may include: Eat bland, low-fat foods, like crackers, toast and rice  Eat foods that contain water, like soups and gelatin  Avoid lying down after you eat  Go outdoors for fresh air  Eat more slowly    Other important information Do not drop your pen or knock it against hard surfaces  Do not expose your pen to any liquids  If you think that your pen may be damaged, do not try to fix it. Use a new one Keep the pen cap on until you are ready to inject. Your pen will no longer be sterile if you store an unused pen without the cap, if you pull the pen cap off and put it on again, or if the pen cap is missing. This could lead to an infection  Store the WegovyT pen in the refrigerator from 36F to 46F (2C to 8C) If needed, before removing the pen cap, WegovyT can be stored from 8C to 30C (46F to 86F) in the original carton for up to 28 days.  Keep WegovyT in the original carton to protect it from light  Do not freeze   Throw away pen if WegovyT has been frozen, has been exposed to light or temperatures above 86F (30C), or has been out of the refrigerator for 28 days or longer It's important to properly dispose of your used WegovyT pens. Do not throw the pen away in your household trash. Instead, use an FDA-cleared sharps disposable container or a sturdy household container with a tight-fitting lid, like a heavy duty plastic container.   Wegovy pen training website: https://www.wegovy.com/about-wegovy/how-to-use-the-wegovy-pen.html  Wegovy savings and support link: https://www.wegovy.com/saving-and-support/save-and-support.html    

## 2022-08-22 NOTE — Progress Notes (Signed)
Office: [phone]  /  Fax: [phone]  WEIGHT SUMMARY AND BIOMETRICS  Weight Lost Since Last Visit: 5lb  No data recorded  Vitals Temp: 98 F (36.7 C) BP: 114/76 Pulse Rate: 91 SpO2: 97 %   Anthropometric Measurements Height: 5\' 6"  (1.676 m) Weight: 188 lb (85.3 kg) BMI (Calculated): 30.36 Weight at Last Visit: 193lb Weight Lost Since Last Visit: 5lb Starting Weight: 188lb Total Weight Loss (lbs): 0 lb (0 kg)   Body Composition  Body Fat %: 28.5 % Fat Mass (lbs): 53.6 lbs Muscle Mass (lbs): 128 lbs Total Body Water (lbs): 88 lbs Visceral Fat Rating : 15   Other Clinical Data Fasting: Yes Labs: No Today's Visit #: 10 Starting Date: 03/20/22     HPI  Chief Complaint: OBESITY  Brandon Madden is here to discuss his progress with his obesity treatment plan. He is on the the Category 2 Plan and states he is following his eating plan approximately 70 % of the time. He states he is exercising 15-20 minutes 7 days per week.   Interval History:  Since last office visit he has lost 5 pounds.  He is doing well with the meal plan.  He is drinking water and a diet soda.  He has questions today concerning goals.  He is seeing a therapist and is doing well.     Pharmacotherapy for weight loss: He is currently taking Wegovy 0.5mg  for medical weight loss.  Denies side effects.  He skipped yesterdays dose due to having a colonoscopy today.    Bariatric surgery:  Patient has not had bariatric surgery   Prediabetes Last A1c looked better  Medication(s): Wegovy 0.50 mg SQ weekly.  Denies side effects.  Polyphagia:Yes Lab Results  Component Value Date   HGBA1C 5.4 02/27/2022   HGBA1C 5.9 05/06/2021   HGBA1C 5.5 03/30/2020   HGBA1C 5.6 08/14/2018   HGBA1C 5.8 (H) 11/11/2012   Lab Results  Component Value Date   INSULIN 25.9 (H) 03/20/2022    PHYSICAL EXAM:  Blood pressure 114/76, pulse 91, temperature 98 F (36.7 C), height 5\' 6"  (1.676 m), weight 188 lb (85.3  kg), SpO2 97 %. Body mass index is 30.34 kg/m.  General: He is overweight, cooperative, alert, well developed, and in no acute distress. PSYCH: Has normal mood, affect and thought process.   Extremities: No edema.  Neurologic: No gross sensory or motor deficits. No tremors or fasciculations noted.    DIAGNOSTIC DATA REVIEWED:  BMET    Component Value Date/Time   NA 142 03/20/2022 1011   K 4.6 03/20/2022 1011   CL 103 03/20/2022 1011   CO2 25 03/20/2022 1011   GLUCOSE 110 (H) 03/20/2022 1011   GLUCOSE 104 (H) 03/30/2020 1011   BUN 16 03/20/2022 1011   CREATININE 0.91 03/20/2022 1011   CALCIUM 9.4 03/20/2022 1011   GFRNONAA 93 11/11/2012 1000   GFRAA 108 11/11/2012 1000   Lab Results  Component Value Date   HGBA1C 5.4 02/27/2022   HGBA1C 5.8 (H) 11/11/2012   Lab Results  Component Value Date   INSULIN 25.9 (H) 03/20/2022   Lab Results  Component Value Date   TSH 1.550 03/20/2022   CBC    Component Value Date/Time   WBC 5.1 11/15/2021 1122   RBC 4.89 11/15/2021 1122   HGB 15.9 11/15/2021 1122   HGB 16.7 08/31/2021 1500   HCT 45.1 11/15/2021 1122   HCT 47.7 08/31/2021 1500   PLT 168.0 11/15/2021 1122   PLT 179 08/31/2021  1500   MCV 92.1 11/15/2021 1122   MCV 93 08/31/2021 1500   MCH 32.5 08/31/2021 1500   MCHC 35.2 11/15/2021 1122   RDW 12.5 11/15/2021 1122   RDW 14.2 08/31/2021 1500   Iron Studies No results found for: "IRON", "TIBC", "FERRITIN", "IRONPCTSAT" Lipid Panel     Component Value Date/Time   CHOL 116 01/03/2022 0842   TRIG 60 01/03/2022 0842   HDL 51 01/03/2022 0842   CHOLHDL 2.3 01/03/2022 0842   CHOLHDL 5 03/30/2020 1011   VLDL 24.2 03/30/2020 1011   LDLCALC 52 01/03/2022 0842   Hepatic Function Panel     Component Value Date/Time   PROT 6.7 04/06/2022 0737   ALBUMIN 4.4 04/06/2022 0737   AST 28 04/06/2022 0737   ALT 39 04/06/2022 0737   ALKPHOS 100 04/06/2022 0737   BILITOT 0.3 04/06/2022 0737   BILIDIR 0.11 04/06/2022 0737       Component Value Date/Time   TSH 1.550 03/20/2022 1011   Nutritional Lab Results  Component Value Date   VD25OH 31.0 03/20/2022   VD25OH 26.15 (L) 05/06/2021   VD25OH 32.71 03/30/2020     ASSESSMENT AND PLAN  TREATMENT PLAN FOR OBESITY:  Recommended Dietary Goals  Rourke is currently in the action stage of change. As such, his goal is to continue weight management plan. He has agreed to the Category 2 Plan.  Behavioral Intervention  We discussed the following Behavioral Modification Strategies today: increasing lean protein intake, decreasing simple carbohydrates , increasing vegetables, increasing lower glycemic fruits, increasing fiber rich foods, avoiding skipping meals, increasing water intake, work on meal planning and preparation, keeping healthy foods at home, continue to practice mindfulness when eating, and planning for success.  Additional resources provided today: NA  Recommended Physical Activity Goals  Booker has been advised to work up to 150 minutes of moderate intensity aerobic activity a week and strengthening exercises 2-3 times per week for cardiovascular health, weight loss maintenance and preservation of muscle mass.   He has agreed to Continue current level of physical activity , Think about ways to increase daily physical activity and overcoming barriers to exercise, and Increase physical activity in their day and reduce sedentary time (increase NEAT).   Pharmacotherapy We discussed various medication options to help Vernon with his weight loss efforts and we both agreed to continue [facility] 0.5mg .  side effects discussed.  ASSOCIATED CONDITIONS ADDRESSED TODAY  Action/Plan  Prediabetes Continue Wegovy 0.5mg .    Airen will continue to work on weight loss, exercise, and decreasing simple carbohydrates to help decrease the risk of diabetes.    Generalized obesity  BMI 30.0-30.9,adult     Goals discussed today:  Improve prediabetes with  continued dietary changes, exercise and weight loss Weight loss and importance for fatty liver disease-reduce risks of worsening of fatty liver/cirrhosis  Weight loss and heart disease-continue Wegovy-see SELECT study-reduce the risk of MI/CVA-patient has CAD, carotid stenosis, PAF Continued improvement/weight loss for HLD-reduces the risk of MI/CVA   Patient expressed interest in starting a weight loss medication OTC that he saw on social media.  I highly recommended and encouraged him not to take it due to his history of CAD, PAF, SVT, etc.  He's doing well on Wegovy and I recommended taking Wegovy over any medication OTC.  Would also avoid Phentermine and Qsymia due to cardiac history.     Return in about 4 weeks (around 09/19/2022).Marland Kitchen He was informed of the importance of frequent follow up visits to maximize  his success with intensive lifestyle modifications for his multiple health conditions.   ATTESTASTION STATEMENTS:  Reviewed by clinician on day of visit: allergies, medications, problem list, medical history, surgical history, family history, social history, and previous encounter notes.   Time spent on visit including pre-visit chart review and post-visit care and charting was 30 minutes.    Theodis Sato. Isa Kohlenberg FNP-C

## 2022-08-28 ENCOUNTER — Other Ambulatory Visit: Payer: Self-pay | Admitting: Bariatrics

## 2022-09-01 ENCOUNTER — Ambulatory Visit (INDEPENDENT_AMBULATORY_CARE_PROVIDER_SITE_OTHER): Payer: No Typology Code available for payment source

## 2022-09-01 DIAGNOSIS — I471 Supraventricular tachycardia, unspecified: Secondary | ICD-10-CM

## 2022-09-01 LAB — CUP PACEART REMOTE DEVICE CHECK
Battery Remaining Longevity: 167 mo
Battery Voltage: 3.08 V
Brady Statistic AP VP Percent: 4.95 %
Brady Statistic AP VS Percent: 1.49 %
Brady Statistic AS VP Percent: 0.05 %
Brady Statistic AS VS Percent: 93.51 %
Brady Statistic RA Percent Paced: 6.42 %
Brady Statistic RV Percent Paced: 5 %
Date Time Interrogation Session: 20240711221005
Implantable Lead Connection Status: 753985
Implantable Lead Connection Status: 753985
Implantable Lead Implant Date: 20230713
Implantable Lead Implant Date: 20230713
Implantable Lead Location: 753859
Implantable Lead Location: 753860
Implantable Lead Model: 3830
Implantable Lead Model: 5076
Implantable Pulse Generator Implant Date: 20230713
Lead Channel Impedance Value: 361 Ohm
Lead Channel Impedance Value: 418 Ohm
Lead Channel Impedance Value: 456 Ohm
Lead Channel Impedance Value: 551 Ohm
Lead Channel Pacing Threshold Amplitude: 0.5 V
Lead Channel Pacing Threshold Amplitude: 0.875 V
Lead Channel Pacing Threshold Pulse Width: 0.4 ms
Lead Channel Pacing Threshold Pulse Width: 0.4 ms
Lead Channel Sensing Intrinsic Amplitude: 13.5 mV
Lead Channel Sensing Intrinsic Amplitude: 13.5 mV
Lead Channel Sensing Intrinsic Amplitude: 2.625 mV
Lead Channel Sensing Intrinsic Amplitude: 2.625 mV
Lead Channel Setting Pacing Amplitude: 1.5 V
Lead Channel Setting Pacing Amplitude: 2 V
Lead Channel Setting Pacing Pulse Width: 0.4 ms
Lead Channel Setting Sensing Sensitivity: 1.2 mV
Zone Setting Status: 755011

## 2022-09-05 ENCOUNTER — Other Ambulatory Visit: Payer: Self-pay | Admitting: Bariatrics

## 2022-09-05 MED ORDER — WEGOVY 0.5 MG/0.5ML ~~LOC~~ SOAJ: 0.5000 mg | SUBCUTANEOUS | 0 refills | Status: DC

## 2022-09-15 NOTE — Progress Notes (Signed)
Remote pacemaker transmission.   

## 2022-09-19 ENCOUNTER — Ambulatory Visit: Payer: No Typology Code available for payment source | Admitting: Bariatrics

## 2022-09-27 ENCOUNTER — Telehealth: Payer: Self-pay | Admitting: Cardiology

## 2022-09-27 NOTE — Telephone Encounter (Signed)
Attempted phone call to pt.  No voicemail message left at this time.

## 2022-09-27 NOTE — Telephone Encounter (Signed)
Remote transmission received. Shows normal device function. Presenting rhythm, NSR w/ PAC's. No episodes recorded during symptoms. Rate drop responses recorded overnight and none since pt woke up this am at 06:00. Rate drop response episodes are not a new finding for pt. Educated pt that PAC's may cause a brief drop in rate on apple watch. Advised pt I will forward back to triage as device is working as should and unable to correlate any symptoms to information available on device. Pt voiced understanding/agreeable to plan.

## 2022-09-27 NOTE — Telephone Encounter (Signed)
STAT if HR is under 50 or over 120 (normal HR is 60-100 beats per minute)  What is your heart rate? 40's, then went up into the 80's-90's  Do you have a log of your heart rate readings (document readings)? No  Do you have any other symptoms? Patient stated that he got an alert from apple watch that his heart rate was down in the 40's. Patient stated that he felt a pressure in the middle of his chest. Patient stated he felt a little SOB when this happened. Patient stated after a few minutes, everything stopped and his heart rate went back up to the 80's - 90's. Patient stated he sent a transmission this morning. Patient would like to know if we show anything from his device. Patient stated all of this happened this morning around 7:15 AM

## 2022-09-27 NOTE — Telephone Encounter (Signed)
I spoke with patient.  He reports he had pain in the center of his chest associated with shortness of breath. Occurred while he was sitting at his desk working.  Pain was not like pressure in chest that he had in the past.  Pain today was a grasping type pain.  Lasted about 5-7 minutes.  He walked around house and pain eventually went away.  He looked at his watch when pain started and noted information about heart rate outlined below.  Patient feels a little washed out now but otherwise feels OK. No other recent episodes of pain. He has been able to do all normal activities recently without any pain. No diet change today Patient has NTG but did not need to use. Patient advised he could try NTG if pain occurred again.  ED precautions reviewed with patient. Patient would like Dr Di Kindle thoughts on what may have caused pain today.

## 2022-09-28 ENCOUNTER — Ambulatory Visit: Payer: No Typology Code available for payment source | Admitting: Psychology

## 2022-09-28 DIAGNOSIS — F4321 Adjustment disorder with depressed mood: Secondary | ICD-10-CM

## 2022-09-28 NOTE — Progress Notes (Signed)
[facility] Counselor/Therapist Progress Note  Patient ID: Brandon Madden, MRN: [id number],    Date: 09/28/2022  Time Spent: 60 mins; start time: 1606; end time: 1707  Treatment Type: Individual Therapy  Reported Symptoms: Pt presents for session in person in the office, granting consent for the session.  Mental Status Exam: Appearance:  Casual     Behavior: Appropriate  Motor: Normal  Speech/Language:  Clear and Coherent  Affect: Appropriate  Mood: normal  Thought process: normal  Thought content:   WNL  Sensory/Perceptual disturbances:   WNL  Orientation: oriented to person, place, and time/date  Attention: Good  Concentration: Good  Memory: WNL  Fund of knowledge:  Good  Insight:   Good  Judgment:  Good  Impulse Control: Good   Risk Assessment: Danger to Self:  No Self-injurious Behavior: No Danger to Others: No Duty to Warn:no Physical Aggression / Violence:No  Access to Firearms a concern: No  Gang Involvement:No   Subjective: Pt shares that he has been pretty good since our last session.  "We travelled to [place] for a family reunion and that was good.  We have also been busy with the kids."  Pt shares that his daughter, Montez Morita, starts school at [employer] next week.  She is planning to study preMed.  Pt and Jean Rosenthal went to [place] and [employer] for baseball games.  Pt shares that work "is about the same; it is always busy."  Pt shares that the stresses of work are what brought him to therapy and they are still there.  Pt shares that Tuesday morning he woke up early Tuesday morning with chest pain and shortness of breath.  He talked with the RN who gave him positive feedback and told him the doctor would call him but he has not heard from the doctor yet.  [name]'s birthday was yesterday.  Pt also shares that he has another stressor and that is the current political climate.  He has differing opinions from many people in his orbit.  He shares that [name] is a [name]  supporter and she has influenced Jean Rosenthal in his beliefs.  Pt shares he has been reading for pleasure as a self care activity and he continues to walk regularly as well.  Encouraged pt to continue with his self care activities and we will meet in 5 wks for a follow up session.  Interventions: Cognitive Behavioral Therapy  Diagnosis:Adjustment disorder with depressed mood  Plan: Treatment Plan Strengths/Abilities:  Intelligent, Intuitive, Willing to participate in therapy Treatment Preferences:  Outpatient Individual Therapy Statement of Needs:  Patient is to use CBT, mindfulness and coping skills to help manage and/or decrease symptoms associated with their diagnosis. Symptoms:  Depressed/Irritable mood, worry, social withdrawal Problems Addressed:  Depressive thoughts, Sadness, Sleep issues, etc. Long Term Goals:  Pt to reduce overall level, frequency, and intensity of the feelings of depression as evidenced by decreased irritability, negative self talk, and helpless feelings from 6 to 7 days/week to 0 to 1 days/week, per client report, for at least 3 consecutive months.  Progress: 20% Short Term Goals:  Pt to verbally express understanding of the relationship between feelings of depression and their impact on thinking patterns and behaviors.  Pt to verbalize an understanding of the role that distorted thinking plays in creating fears, excessive worry, and ruminations.  Progress: 20% Target Date:  07/25/2023 Frequency:  Bi-weekly Modality:  Cognitive Behavioral Therapy Interventions by Therapist:  Therapist will use CBT, Mindfulness exercises, Coping skills and Referrals, as  needed by client. Client has verbally approved this treatment plan.  Karie Kirks, [facility]

## 2022-10-05 NOTE — Telephone Encounter (Signed)
I am unsure what caused his pain, but it does not appear to be related to his heart rate. I agree that if this recurs and lasts more than a few minutes he should go to the ER.

## 2022-10-05 NOTE — Telephone Encounter (Signed)
Returned call to patient and reviewed the following review with the patient.   "I am unsure what caused his pain, but it does not appear to be related to his heart rate. I agree that if this recurs and lasts more than a few minutes he should go to the ER."

## 2022-10-10 ENCOUNTER — Other Ambulatory Visit: Payer: Self-pay | Admitting: Physician Assistant

## 2022-11-02 ENCOUNTER — Ambulatory Visit: Payer: No Typology Code available for payment source | Admitting: Psychology

## 2022-11-16 ENCOUNTER — Ambulatory Visit: Payer: No Typology Code available for payment source | Admitting: Psychology

## 2022-11-16 DIAGNOSIS — F4321 Adjustment disorder with depressed mood: Secondary | ICD-10-CM | POA: Diagnosis not present

## 2022-11-16 NOTE — Progress Notes (Signed)
[facility] Counselor/Therapist Progress Note  Patient ID: Brandon Madden, MRN: [id number],    Date: 11/16/2022  Time Spent: 60 mins; start time: 1600; end time: 1700  Treatment Type: Individual Therapy  Reported Symptoms: Pt presents for session in person in the office, granting consent for the session.  Mental Status Exam: Appearance:  Casual     Behavior: Appropriate  Motor: Normal  Speech/Language:  Clear and Coherent  Affect: Appropriate  Mood: normal  Thought process: normal  Thought content:   WNL  Sensory/Perceptual disturbances:   WNL  Orientation: oriented to person, place, and time/date  Attention: Good  Concentration: Good  Memory: WNL  Fund of knowledge:  Good  Insight:   Good  Judgment:  Good  Impulse Control: Good   Risk Assessment: Danger to Self:  No Self-injurious Behavior: No Danger to Others: No Duty to Warn:no Physical Aggression / Violence:No  Access to Firearms a concern: No  Gang Involvement:No   Subjective: Pt shares that he has been pretty good since our last session.  "We took Montez Morita to college at [employer] on 8/14 and that was tough for all of [place].  [name] and I ended up talking with her several times per day, providing her with devotions, helping her understand how to handle stress, etc.  She has adapted well; we have been up there 5 times since 8/14 and took her siblings once and those visits have been helpful as well."  Pt shares that she has already had some successes from a grades perspective.  His son, Jean Rosenthal, has decided to go to [employer] as well.  Both Montez Morita and Jean Rosenthal are excited about being at the same university; Montez Morita wants to be a doctor and Jean Rosenthal wants to be a Clinical research associate.  Pt shares that "work has been very crazy, especially last week.  There was something that happened last week that could have been really bad, but it turned out to be OK."  Pt shares he has been more intentional in his roles as a dad, husband, and employee;  he has enjoyed listening to C.Theadore Nan, talking with his brother, he and [name] are riding bikes together, etc.  Pt shares that he had a respiratory illness for a couple weeks and is now feeling better; he worked through his whole illness.  Pt shares she reports to his manager, Diannia Ruder, and feels that she is a Copy; she normally works at least an 80 hour week; she encourages them to not return emails or messages on the weekends but it is hard not to do.  Pt shares that he and the family are going to [facility] for a long weekend soon and he has other time off scheduled, including the week of Christmas.  Pt shares he has been reading for pleasure as a self care activity and he continues to walk regularly as well.  Encouraged pt to continue with his self care activities and we will meet in 5 wks for a follow up session.  Interventions: Cognitive Behavioral Therapy  Diagnosis:Adjustment disorder with depressed mood  Plan: Treatment Plan Strengths/Abilities:  Intelligent, Intuitive, Willing to participate in therapy Treatment Preferences:  Outpatient Individual Therapy Statement of Needs:  Patient is to use CBT, mindfulness and coping skills to help manage and/or decrease symptoms associated with their diagnosis. Symptoms:  Depressed/Irritable mood, worry, social withdrawal Problems Addressed:  Depressive thoughts, Sadness, Sleep issues, etc. Long Term Goals:  Pt to reduce overall level, frequency, and intensity of the feelings of depression as evidenced  by decreased irritability, negative self talk, and helpless feelings from 6 to 7 days/week to 0 to 1 days/week, per client report, for at least 3 consecutive months.  Progress: 20% Short Term Goals:  Pt to verbally express understanding of the relationship between feelings of depression and their impact on thinking patterns and behaviors.  Pt to verbalize an understanding of the role that distorted thinking plays in creating fears, excessive worry, and  ruminations.  Progress: 20% Target Date:  07/25/2023 Frequency:  Bi-weekly Modality:  Cognitive Behavioral Therapy Interventions by Therapist:  Therapist will use CBT, Mindfulness exercises, Coping skills and Referrals, as needed by client. Client has verbally approved this treatment plan.  Karie Kirks, [facility]

## 2022-11-21 ENCOUNTER — Encounter: Payer: Self-pay | Admitting: Nurse Practitioner

## 2022-11-21 ENCOUNTER — Ambulatory Visit: Payer: No Typology Code available for payment source | Admitting: Nurse Practitioner

## 2022-11-21 VITALS — BP 120/73 | HR 81 | Temp 97.7°F | Ht 66.0 in | Wt 199.0 lb

## 2022-11-21 DIAGNOSIS — R632 Polyphagia: Secondary | ICD-10-CM

## 2022-11-21 DIAGNOSIS — E669 Obesity, unspecified: Secondary | ICD-10-CM | POA: Diagnosis not present

## 2022-11-21 DIAGNOSIS — Z6832 Body mass index (BMI) 32.0-32.9, adult: Secondary | ICD-10-CM | POA: Diagnosis not present

## 2022-11-21 NOTE — Progress Notes (Unsigned)
Office: [phone]  /  Fax: [phone]  WEIGHT SUMMARY AND BIOMETRICS  Weight Lost Since Last Visit: 0lb  Weight Gained Since Last Visit: 11lb   Vitals Temp: 97.7 F (36.5 C) BP: 120/73 Pulse Rate: 81 SpO2: 95 %   Anthropometric Measurements Height: 5\' 6"  (1.676 m) Weight: 199 lb (90.3 kg) BMI (Calculated): 32.13 Weight at Last Visit: 188lb Weight Lost Since Last Visit: 0lb Weight Gained Since Last Visit: 11lb Starting Weight: 188lb Total Weight Loss (lbs): 0 lb (0 kg)   Body Composition  Body Fat %: 31.3 % Fat Mass (lbs): 62.4 lbs Muscle Mass (lbs): 130 lbs Total Body Water (lbs): 92.6 lbs Visceral Fat Rating : 17   Other Clinical Data Fasting: No Labs: No Today's Visit #: 11 Starting Date: 03/20/22     HPI  Chief Complaint: OBESITY  Brandon Madden is here to discuss his progress with his obesity treatment plan. He is on the the Category 2 Plan and states he is following his eating plan approximately 50 % of the time. He states he is exercising varying minutes 3 days per week.   Interval History:  Since last office visit on 08/22/22 he has gained 11 pounds.     Pharmacotherapy for weight loss: He is not currently taking Wegovy for medical weight loss.  He hasn't been taken [facility] for the past 2 months due to shortage at [facility].  Did well when he was taking [place].  Notes polyphagia since stopping Wegovy.     Bariatric surgery:  Patient has not had bariatric surgery     PHYSICAL EXAM:  Blood pressure 120/73, pulse 81, temperature 97.7 F (36.5 C), height 5\' 6"  (1.676 m), weight 199 lb (90.3 kg), SpO2 95%. Body mass index is 32.12 kg/m.  General: He is overweight, cooperative, alert, well developed, and in no acute distress. PSYCH: Has normal mood, affect and thought process.   Extremities: No edema.  Neurologic: No gross sensory or motor deficits. No tremors or fasciculations noted.    DIAGNOSTIC DATA REVIEWED:  BMET    Component Value Date/Time    NA 142 03/20/2022 1011   K 4.6 03/20/2022 1011   CL 103 03/20/2022 1011   CO2 25 03/20/2022 1011   GLUCOSE 110 (H) 03/20/2022 1011   GLUCOSE 104 (H) 03/30/2020 1011   BUN 16 03/20/2022 1011   CREATININE 0.91 03/20/2022 1011   CALCIUM 9.4 03/20/2022 1011   GFRNONAA 93 11/11/2012 1000   GFRAA 108 11/11/2012 1000   Lab Results  Component Value Date   HGBA1C 5.4 02/27/2022   HGBA1C 5.8 (H) 11/11/2012   Lab Results  Component Value Date   INSULIN 25.9 (H) 03/20/2022   Lab Results  Component Value Date   TSH 1.550 03/20/2022   CBC    Component Value Date/Time   WBC 5.1 11/15/2021 1122   RBC 4.89 11/15/2021 1122   HGB 15.9 11/15/2021 1122   HGB 16.7 08/31/2021 1500   HCT 45.1 11/15/2021 1122   HCT 47.7 08/31/2021 1500   PLT 168.0 11/15/2021 1122   PLT 179 08/31/2021 1500   MCV 92.1 11/15/2021 1122   MCV 93 08/31/2021 1500   MCH 32.5 08/31/2021 1500   MCHC 35.2 11/15/2021 1122   RDW 12.5 11/15/2021 1122   RDW 14.2 08/31/2021 1500   Iron Studies No results found for: "IRON", "TIBC", "FERRITIN", "IRONPCTSAT" Lipid Panel     Component Value Date/Time   CHOL 116 01/03/2022 0842   TRIG 60 01/03/2022 0842   HDL  51 01/03/2022 0842   CHOLHDL 2.3 01/03/2022 0842   CHOLHDL 5 03/30/2020 1011   VLDL 24.2 03/30/2020 1011   LDLCALC 52 01/03/2022 0842   Hepatic Function Panel     Component Value Date/Time   PROT 6.7 04/06/2022 0737   ALBUMIN 4.4 04/06/2022 0737   AST 28 04/06/2022 0737   ALT 39 04/06/2022 0737   ALKPHOS 100 04/06/2022 0737   BILITOT 0.3 04/06/2022 0737   BILIDIR 0.11 04/06/2022 0737      Component Value Date/Time   TSH 1.550 03/20/2022 1011   Nutritional Lab Results  Component Value Date   VD25OH 31.0 03/20/2022   VD25OH 26.15 (L) 05/06/2021   VD25OH 32.71 03/30/2020     ASSESSMENT AND PLAN  TREATMENT PLAN FOR OBESITY:  Recommended Dietary Goals  Brandon Madden is currently in the action stage of change. As such, his goal is to continue weight  management plan. He has agreed to the Category 2 Plan.  Behavioral Intervention  We discussed the following Behavioral Modification Strategies today: increasing lean protein intake, decreasing simple carbohydrates , increasing vegetables, increasing lower glycemic fruits, increasing water intake, work on meal planning and preparation, work on tracking and journaling calories using tracking application, reading food labels , keeping healthy foods at home, continue to practice mindfulness when eating, and planning for success.  Additional resources provided today: NA  Recommended Physical Activity Goals  Brandon Madden has been advised to work up to 150 minutes of moderate intensity aerobic activity a week and strengthening exercises 2-3 times per week for cardiovascular health, weight loss maintenance and preservation of muscle mass.   He has agreed to Think about ways to increase daily physical activity and overcoming barriers to exercise, Increase physical activity in their day and reduce sedentary time (increase NEAT)., and Work on scheduling and tracking physical activity.    Pharmacotherapy We discussed various medication options to help Brandon Madden with his weight loss efforts and we both agreed to ***.  ASSOCIATED CONDITIONS ADDRESSED TODAY  Action/Plan  There are no diagnoses linked to this encounter.       No follow-ups on file.Marland Kitchen He was informed of the importance of frequent follow up visits to maximize his success with intensive lifestyle modifications for his multiple health conditions.   ATTESTASTION STATEMENTS:  Reviewed by clinician on day of visit: allergies, medications, problem list, medical history, surgical history, family history, social history, and previous encounter notes.     Brandon Madden. Khara Renaud FNP-C

## 2022-12-01 ENCOUNTER — Ambulatory Visit (INDEPENDENT_AMBULATORY_CARE_PROVIDER_SITE_OTHER): Payer: No Typology Code available for payment source

## 2022-12-01 DIAGNOSIS — R55 Syncope and collapse: Secondary | ICD-10-CM | POA: Diagnosis not present

## 2022-12-01 LAB — CUP PACEART REMOTE DEVICE CHECK
Battery Remaining Longevity: 164 mo
Battery Voltage: 3.06 V
Brady Statistic AP VP Percent: 5.15 %
Brady Statistic AP VS Percent: 1.71 %
Brady Statistic AS VP Percent: 0.06 %
Brady Statistic AS VS Percent: 93.08 %
Brady Statistic RA Percent Paced: 6.84 %
Brady Statistic RV Percent Paced: 5.21 %
Date Time Interrogation Session: 20241011025318
Implantable Lead Connection Status: 753985
Implantable Lead Connection Status: 753985
Implantable Lead Implant Date: 20230713
Implantable Lead Implant Date: 20230713
Implantable Lead Location: 753859
Implantable Lead Location: 753860
Implantable Lead Model: 3830
Implantable Lead Model: 5076
Implantable Pulse Generator Implant Date: 20230713
Lead Channel Impedance Value: 361 Ohm
Lead Channel Impedance Value: 418 Ohm
Lead Channel Impedance Value: 456 Ohm
Lead Channel Impedance Value: 513 Ohm
Lead Channel Pacing Threshold Amplitude: 0.5 V
Lead Channel Pacing Threshold Amplitude: 0.875 V
Lead Channel Pacing Threshold Pulse Width: 0.4 ms
Lead Channel Pacing Threshold Pulse Width: 0.4 ms
Lead Channel Sensing Intrinsic Amplitude: 13.75 mV
Lead Channel Sensing Intrinsic Amplitude: 13.75 mV
Lead Channel Sensing Intrinsic Amplitude: 2.875 mV
Lead Channel Sensing Intrinsic Amplitude: 2.875 mV
Lead Channel Setting Pacing Amplitude: 1.5 V
Lead Channel Setting Pacing Amplitude: 2 V
Lead Channel Setting Pacing Pulse Width: 0.4 ms
Lead Channel Setting Sensing Sensitivity: 1.2 mV
Zone Setting Status: 755011

## 2022-12-08 NOTE — Progress Notes (Signed)
Remote pacemaker transmission.   

## 2022-12-28 ENCOUNTER — Ambulatory Visit: Payer: No Typology Code available for payment source | Admitting: Psychology

## 2023-01-04 ENCOUNTER — Ambulatory Visit: Payer: No Typology Code available for payment source | Admitting: Psychology

## 2023-01-04 DIAGNOSIS — F4321 Adjustment disorder with depressed mood: Secondary | ICD-10-CM

## 2023-01-04 NOTE — Progress Notes (Signed)
[facility] Counselor/Therapist Progress Note  Patient ID: Brandon Madden, MRN: [id number],    Date: 01/04/2023  Time Spent: 60 mins; start time: 1100; end time: 1200  Treatment Type: Individual Therapy  Reported Symptoms: Pt presents for session in person in the office, granting consent for the session.  Mental Status Exam: Appearance:  Casual     Behavior: Appropriate  Motor: Normal  Speech/Language:  Clear and Coherent  Affect: Appropriate  Mood: normal  Thought process: normal  Thought content:   WNL  Sensory/Perceptual disturbances:   WNL  Orientation: oriented to person, place, and time/date  Attention: Good  Concentration: Good  Memory: WNL  Fund of knowledge:  Good  Insight:   Good  Judgment:  Good  Impulse Control: Good   Risk Assessment: Danger to Self:  No Self-injurious Behavior: No Danger to Others: No Duty to Warn:no Physical Aggression / Violence:No  Access to Firearms a concern: No  Gang Involvement:No   Subjective: Pt shares that he has been stressed because of Charlotte's surgery coming up this next Tuesday (back Surgery at [facility]).  "Overall I have been feeling low lately; I worked hard on a project at work and it went well but I was accused of something I didn't do from Human resources officer on the project and IT consultant did not support me the way she should have and that did not feel good."  Pt also shares he does not feel great about the results of the election.  He and [name] had a disagreement about having Jean Rosenthal perform a certain task at home and [name] immediately assigned it to [employer] and pt disagreed with her and told her so.  Pt and [name] have significantly differing opinions about politics.  Pt shares that he and [name] were able to have a long conversation on Saturday and pt feels like they may have made some progress; she gets very defensive when they talk.  Pt has been so stressed that he can feel his A-Fib through this process as well.  Pt  shares he has renewed his gym membership.  He has made efforts to schedule his family Christmas trip to the mountains and he is looking forward to that.  Talked with pt about allowing God to be for him what he needs God to be for him.  Pt shares, "I often hear the message back that no one cares what Mukesh wants."  We talked about where that thought comes from for pt (8th of 10 kids).  Encouraged pt to know that God cares about what pt wants/needs and encouraged pt to be willing to ask God for what pt wants/needs to improve his mood.  Encouraged pt to continue with his self care activities and we will meet in 5 wks for a follow up session.  Interventions: Cognitive Behavioral Therapy  Diagnosis:Adjustment disorder with depressed mood  Plan: Treatment Plan Strengths/Abilities:  Intelligent, Intuitive, Willing to participate in therapy Treatment Preferences:  Outpatient Individual Therapy Statement of Needs:  Patient is to use CBT, mindfulness and coping skills to help manage and/or decrease symptoms associated with their diagnosis. Symptoms:  Depressed/Irritable mood, worry, social withdrawal Problems Addressed:  Depressive thoughts, Sadness, Sleep issues, etc. Long Term Goals:  Pt to reduce overall level, frequency, and intensity of the feelings of depression as evidenced by decreased irritability, negative self talk, and helpless feelings from 6 to 7 days/week to 0 to 1 days/week, per client report, for at least 3 consecutive months.  Progress: 20% Short Term  Goals:  Pt to verbally express understanding of the relationship between feelings of depression and their impact on thinking patterns and behaviors.  Pt to verbalize an understanding of the role that distorted thinking plays in creating fears, excessive worry, and ruminations.  Progress: 20% Target Date:  07/25/2023 Frequency:  Bi-weekly Modality:  Cognitive Behavioral Therapy Interventions by Therapist:  Therapist will use CBT, Mindfulness  exercises, Coping skills and Referrals, as needed by client. Client has verbally approved this treatment plan.  Karie Kirks, [facility]

## 2023-01-10 ENCOUNTER — Other Ambulatory Visit: Payer: Self-pay

## 2023-01-10 DIAGNOSIS — I2510 Atherosclerotic heart disease of native coronary artery without angina pectoris: Secondary | ICD-10-CM

## 2023-01-10 MED ORDER — CLOPIDOGREL BISULFATE 75 MG PO TABS
75.0000 mg | ORAL_TABLET | Freq: Every day | ORAL | 2 refills | Status: DC
Start: 2023-01-10 — End: 2024-01-03

## 2023-02-06 ENCOUNTER — Ambulatory Visit: Payer: No Typology Code available for payment source | Admitting: Psychology

## 2023-02-06 DIAGNOSIS — F4321 Adjustment disorder with depressed mood: Secondary | ICD-10-CM

## 2023-02-06 NOTE — Progress Notes (Signed)
[facility] Counselor/Therapist Progress Note  Patient ID: Latif Bossie, MRN: [id number],    Date: 02/06/2023  Time Spent: 60 mins; start time: 1600; end time: 1700  Treatment Type: Individual Therapy  Reported Symptoms: Pt presents for session in person in the office, granting consent for the session.  Mental Status Exam: Appearance:  Casual     Behavior: Appropriate  Motor: Normal  Speech/Language:  Clear and Coherent  Affect: Appropriate  Mood: normal  Thought process: normal  Thought content:   WNL  Sensory/Perceptual disturbances:   WNL  Orientation: oriented to person, place, and time/date  Attention: Good  Concentration: Good  Memory: WNL  Fund of knowledge:  Good  Insight:   Good  Judgment:  Good  Impulse Control: Good   Risk Assessment: Danger to Self:  No Self-injurious Behavior: No Danger to Others: No Duty to Warn:no Physical Aggression / Violence:No  Access to Firearms a concern: No  Gang Involvement:No   Subjective: Pt shares that he is pleased that Charlotte's surgery at [facility] went well and she is recovering well now; she had some pain but that has gotten better.  Pt shares that work is very busy for him.  "Things with me and [name] are solid right now; we are focusing on the common ground things for [place]."  Pt is trying to focus on the positives about [name] and trying to deflect his thoughts about the negative aspects of her.  Pt shares that he has been exhausted lately; he is sleeping well but believes the situation with [name] and worrying about her just wore him out.  Pt is stressed by work; "I have a 5pm phone call today that is going to be stressful for me.  I am taking the whole week of Christmas off and I am looking forward to that."  Pt is still mourning the loss of his friend Freida Busman and the conversations they had.  He has another friend in [place]; they talk every morning for 10 min and he enjoys those conversations.  Pt has joined  his raquetball club again and looks forward to the exercise.  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  He is supportive of the wedding.  Pt is aware that he has blessings from God and he tries to keep those in mind.  He knows he needs to be more active in order to effective his weight in the right direction.  He is no longer a client at [facility] because he got frustrated with them.  He took his family to the mountains for their annual Christmas trip and he enjoyed the time; it seems that the rest of the family enjoyed the time as well.  Pt and the family are going to [place] family near [place] and then are going to [place], [place] on Christmas Day for four days.  Encouraged pt to know that God cares about what pt wants/needs and encouraged pt to be willing to ask God for what pt wants/needs to improve his mood.  Also encouraged pt to focus on nurturing himself in the same way that he nurtures his family members; try to begin making himself a priority as well.  Encouraged pt to continue with his self care activities and we will meet in 4 wks for a follow up session.  Interventions: Cognitive Behavioral Therapy  Diagnosis:Adjustment disorder with depressed mood  Plan: Treatment Plan Strengths/Abilities:  Intelligent, Intuitive,  Willing to participate in therapy Treatment Preferences:  Outpatient Individual Therapy Statement of Needs:  Patient is to use CBT, mindfulness and coping skills to help manage and/or decrease symptoms associated with their diagnosis. Symptoms:  Depressed/Irritable mood, worry, social withdrawal Problems Addressed:  Depressive thoughts, Sadness, Sleep issues, etc. Long Term Goals:  Pt to reduce overall level, frequency, and intensity of the feelings of depression as evidenced by decreased irritability, negative self talk, and helpless feelings from 6 to 7 days/week to 0 to 1 days/week, per client report, for at  least 3 consecutive months.  Progress: 20% Short Term Goals:  Pt to verbally express understanding of the relationship between feelings of depression and their impact on thinking patterns and behaviors.  Pt to verbalize an understanding of the role that distorted thinking plays in creating fears, excessive worry, and ruminations.  Progress: 20% Target Date:  07/25/2023 Frequency:  Bi-weekly Modality:  Cognitive Behavioral Therapy Interventions by Therapist:  Therapist will use CBT, Mindfulness exercises, Coping skills and Referrals, as needed by client. Client has verbally approved this treatment plan.  Karie Kirks, [facility]

## 2023-02-09 ENCOUNTER — Ambulatory Visit (HOSPITAL_BASED_OUTPATIENT_CLINIC_OR_DEPARTMENT_OTHER): Payer: No Typology Code available for payment source | Admitting: Cardiology

## 2023-02-09 VITALS — BP 124/82 | HR 81 | Ht 66.0 in | Wt 209.4 lb

## 2023-02-09 DIAGNOSIS — I480 Paroxysmal atrial fibrillation: Secondary | ICD-10-CM

## 2023-02-09 DIAGNOSIS — E66811 Obesity, class 1: Secondary | ICD-10-CM

## 2023-02-09 DIAGNOSIS — Z955 Presence of coronary angioplasty implant and graft: Secondary | ICD-10-CM

## 2023-02-09 DIAGNOSIS — R002 Palpitations: Secondary | ICD-10-CM | POA: Diagnosis not present

## 2023-02-09 DIAGNOSIS — Z6833 Body mass index (BMI) 33.0-33.9, adult: Secondary | ICD-10-CM

## 2023-02-09 DIAGNOSIS — I2510 Atherosclerotic heart disease of native coronary artery without angina pectoris: Secondary | ICD-10-CM | POA: Diagnosis not present

## 2023-02-09 DIAGNOSIS — K760 Fatty (change of) liver, not elsewhere classified: Secondary | ICD-10-CM

## 2023-02-09 DIAGNOSIS — E8881 Metabolic syndrome: Secondary | ICD-10-CM | POA: Diagnosis not present

## 2023-02-09 DIAGNOSIS — E6609 Other obesity due to excess calories: Secondary | ICD-10-CM

## 2023-02-09 NOTE — Progress Notes (Unsigned)
Cardiology Office Note:  .   Date:  02/09/2023  ID:  Brandon Madden, DOB 1960/02/26, MRN [id number] PCP: [place], Betty G, MD  [facility] Providers Cardiologist:  [name], MD (Inactive) Electrophysiologist:  Will Jorja Loa, MD {  History of Present Illness: .   Brandon Madden is a 62 y.o. male ***  Pertinent CV history:  Today: Concerns today: -afib/palpitations, concerned that he does not get results sent to him from his monitor and then finds out later. -intermittent chest discomfort -struggles with weight. Did not have weight issues prior to ~20-25 years ago. Under a lot of stress, wasn't sleeping well, now on CPAP. Has a stressful job. Feels that cardiac rehab was when he was at his best lifestyle, but now he misses the routine/focus. Diet is 70-80s5 "good," wants to change lifestyle and avoid medications. Keto works but is not sustainable for him. Is working to get back into activity, doing racquetball once/week, walks 20 minutes about three times/week at a leisurely pace. While at work, he is standing/sitting 10-12 hours a day. He is able to work in the yard for hours at a time when the weather is good. -feels exhausted all the time. Recently worse after his daughter was in the hospital. Sleeps well, can sleep 10 hours straight through with CPAP but still wakes up tired in the morning.  Discussed chronic stress management, how long term sympathetic drive/cortisol affects metabolism. Notes that he feels better after praying or playing racquetball. Discussed finding more ways to incorporate this into his life. Discussed suggestions, such as PREP, personal trainer  We discussed metabolic syndrome at length today. Noted that he has a history of mildly elevated LFTs, concerned about fatty liver. Ultrasound in 2023 showed hepatic steatosis.  We discussed diet and exercise at length today, including glycemic index, Mediterranean based diet, role for cardiovascular and  strength   Palpitations and chest pressure can happen independently or at the same time. Both can happen a few times per week. Notes that stress makes both of these worse. Worst episode recently lasted about a half hour, not short of breath, nauseated, sweaty, dizzy, etc. Has never taken a nitroglycerin.   ROS: Denies chest pain, shortness of breath at rest or with normal exertion. No PND, orthopnea, LE edema or unexpected weight gain. No syncope or palpitations. ROS otherwise negative except as noted.   Studies Reviewed: Marland Kitchen    EKG:       Physical Exam:   VS:  BP 124/82   Pulse 81   Ht 5\' 6"  (1.676 m)   Wt 209 lb 6.4 oz (95 kg)   SpO2 98%   BMI 33.80 kg/m    Wt Readings from Last 3 Encounters:  02/09/23 209 lb 6.4 oz (95 kg)  11/21/22 199 lb (90.3 kg)  08/22/22 188 lb (85.3 kg)    GEN: Well nourished, well developed in no acute distress HEENT: Normal, moist mucous membranes NECK: No JVD CARDIAC: regular rhythm, normal S1 and S2, no rubs or gallops. No murmur. VASCULAR: Radial and DP pulses 2+ bilaterally. No carotid bruits RESPIRATORY:  Clear to auscultation without rales, wheezing or rhonchi  ABDOMEN: Soft, non-tender, non-distended MUSCULOSKELETAL:  Ambulates independently SKIN: Warm and dry, no edema NEUROLOGIC:  Alert and oriented x 3. No focal neuro deficits noted. PSYCHIATRIC:  Normal affect    ASSESSMENT AND PLAN: .     CV risk counseling and prevention -recommend heart healthy/Mediterranean diet, with whole grains, fruits, vegetable, fish, lean meats,  nuts, and olive oil. Limit salt. -recommend moderate walking, 3-5 times/week for 30-50 minutes each session. Aim for at least 150 minutes.week. Goal should be pace of 3 miles/hours, or walking 1.5 miles in 30 minutes -recommend avoidance of tobacco products. Avoid excess alcohol. -ASCVD risk score: The ASCVD Risk score (Arnett DK, et al., 2019) failed to calculate for the following reasons:   The valid total  cholesterol range is 130 to 320 mg/dL    Dispo: ***  Signed, Jodelle Red, MD   Jodelle Red, MD, PhD, [facility]  [facility]  [place]  [facility] [address], [address] [place], [place] [address] [phone]

## 2023-02-09 NOTE — Patient Instructions (Addendum)
Medication Instructions:  Your physician recommends that you continue on your current medications as directed. Please refer to the Current Medication list given to you today.  *If you need a refill on your cardiac medications before your next appointment, please call your pharmacy*   Follow-Up: At [facility], you and your health needs are our priority.  As part of our continuing mission to provide you with exceptional heart care, we have created designated Provider Care Teams.  These Care Teams include your primary Cardiologist (physician) and Advanced Practice Providers (APPs -  Physician Assistants and Nurse Practitioners) who all work together to provide you with the care you need, when you need it.  We recommend signing up for the patient portal called "MyChart".  Sign up information is provided on this After Visit Summary.  MyChart is used to connect with patients for Virtual Visits (Telemedicine).  Patients are able to view lab/test results, encounter notes, upcoming appointments, etc.  Non-urgent messages can be sent to your provider as well.   To learn more about what you can do with MyChart, go to [email or web address].    Your next appointment:   April 10, 2023 @ 10:30  Provider:   Jodelle Red, MD    Other Instructions Look into [employer] mobile.

## 2023-02-20 ENCOUNTER — Encounter (HOSPITAL_BASED_OUTPATIENT_CLINIC_OR_DEPARTMENT_OTHER): Payer: Self-pay

## 2023-02-21 ENCOUNTER — Telehealth: Payer: Self-pay | Admitting: Student

## 2023-02-21 NOTE — Telephone Encounter (Addendum)
   The patient called the after-hours line reporting isolated swelling along his left hand. This has been occurring for 2 days. He is unaware of any injury to the site. No specific pain or discoloration. No new medications. No swelling elsewhere throughout his body and otherwise feeling at baseline. He does report being under increased stress as he is currently out of town visiting a family member in the hospital who is transitioning to comfort care.  We reviewed that the isolated swelling along his left hand overall seems atypical for a cardiac etiology. I did encourage him to be evaluated at an Urgent Care as imaging can be obtained.  He voiced understanding of this and was appreciative of the return call.  Signed, Laymon CHRISTELLA Qua, PA-C 02/21/2023, 4:32 PM

## 2023-03-02 ENCOUNTER — Ambulatory Visit (INDEPENDENT_AMBULATORY_CARE_PROVIDER_SITE_OTHER): Payer: No Typology Code available for payment source

## 2023-03-02 DIAGNOSIS — R55 Syncope and collapse: Secondary | ICD-10-CM | POA: Diagnosis not present

## 2023-03-02 LAB — CUP PACEART REMOTE DEVICE CHECK
Battery Remaining Longevity: 161 mo
Battery Voltage: 3.05 V
Brady Statistic AP VP Percent: 6.13 %
Brady Statistic AP VS Percent: 1.92 %
Brady Statistic AS VP Percent: 0.07 %
Brady Statistic AS VS Percent: 91.88 %
Brady Statistic RA Percent Paced: 8.02 %
Brady Statistic RV Percent Paced: 6.2 %
Date Time Interrogation Session: 20250110064914
Implantable Lead Connection Status: 753985
Implantable Lead Connection Status: 753985
Implantable Lead Implant Date: 20230713
Implantable Lead Implant Date: 20230713
Implantable Lead Location: 753859
Implantable Lead Location: 753860
Implantable Lead Model: 3830
Implantable Lead Model: 5076
Implantable Pulse Generator Implant Date: 20230713
Lead Channel Impedance Value: 361 Ohm
Lead Channel Impedance Value: 437 Ohm
Lead Channel Impedance Value: 475 Ohm
Lead Channel Impedance Value: 532 Ohm
Lead Channel Pacing Threshold Amplitude: 0.5 V
Lead Channel Pacing Threshold Amplitude: 0.875 V
Lead Channel Pacing Threshold Pulse Width: 0.4 ms
Lead Channel Pacing Threshold Pulse Width: 0.4 ms
Lead Channel Sensing Intrinsic Amplitude: 13.875 mV
Lead Channel Sensing Intrinsic Amplitude: 13.875 mV
Lead Channel Sensing Intrinsic Amplitude: 3.125 mV
Lead Channel Sensing Intrinsic Amplitude: 3.125 mV
Lead Channel Setting Pacing Amplitude: 1.5 V
Lead Channel Setting Pacing Amplitude: 2 V
Lead Channel Setting Pacing Pulse Width: 0.4 ms
Lead Channel Setting Sensing Sensitivity: 1.2 mV
Zone Setting Status: 755011

## 2023-03-04 ENCOUNTER — Encounter (HOSPITAL_BASED_OUTPATIENT_CLINIC_OR_DEPARTMENT_OTHER): Payer: Self-pay | Admitting: Cardiology

## 2023-03-08 ENCOUNTER — Ambulatory Visit: Payer: No Typology Code available for payment source | Admitting: Psychology

## 2023-03-08 DIAGNOSIS — F4321 Adjustment disorder with depressed mood: Secondary | ICD-10-CM | POA: Diagnosis not present

## 2023-03-08 NOTE — Progress Notes (Signed)
[facility] Counselor/Therapist Progress Note  Patient ID: [place], MRN: [id number],    Date: 03/08/2023  Time Spent: 45 mins; start time: 1615; end time: 1700  Treatment Type: Individual Therapy  Reported Symptoms: Pt presents for session in person in the office, granting consent for the session.  Mental Status Exam: Appearance:  Casual     Behavior: Appropriate  Motor: Normal  Speech/Language:  Clear and Coherent  Affect: Appropriate  Mood: normal  Thought process: normal  Thought content:   WNL  Sensory/Perceptual disturbances:   WNL  Orientation: oriented to person, place, and time/date  Attention: Good  Concentration: Good  Memory: WNL  Fund of knowledge:  Good  Insight:   Good  Judgment:  Good  Impulse Control: Good   Risk Assessment: Danger to Self:  No Self-injurious Behavior: No Danger to Others: No Duty to Warn:no Physical Aggression / Violence:No  Access to Firearms a concern: No  Gang Involvement:No   Subjective: Pt shares that they had a good Christmas until Christmas night.  Br[place] Madden's mom Brandon Madden) fell and injured her hip.  That led to a short surgery and then she ultimately had complications that led to her passing away on 1/2.  They lived near [place], [place].  We consistently went to see them every other weekend and that was great for our family."  Pt shares that Brandon Madden is doing OK but it is a struggle.  One of her sisters is estranged from the family and has been for years.  Br[place] Madd[place]'s dad has been a Education officer, environmental for his career.  Pt is trying to be very supportive of Brandon Madden during this time.  Pt was very proud of how well his children handled the loss and how they responded to others during the funeral.  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  Pt shares that Brandon Madden is continuing to recover from her surgery; they saw the surgeon on 12/23 and he  was pleased with her progress.  Pt's daughter [place] Madd[place]) from his first marriage is getting married on Feb 9th in [place].  Encouraged pt to continue with his self care activities and we will meet in 4 wks for a follow up session.  Interventions: Cognitive Behavioral Therapy  Diagnosis:Adjustment disorder with depressed mood  Plan: Treatment Plan Strengths/Abilities:  Intelligent, Intuitive, Willing to participate in therapy Treatment Preferences:  Outpatient Individual Therapy Statement of Needs:  Patient is to use CBT, mindfulness and coping skills to help manage and/or decrease symptoms associated with their diagnosis. Symptoms:  Depressed/Irritable mood, worry, social withdrawal Problems Addressed:  Depressive thoughts, Sadness, Sleep issues, etc. Long Term Goals:  Pt to reduce overall level, frequency, and intensity of the feelings of depression as evidenced by decreased irritability, negative self talk, and helpless feelings from 6 to 7 days/week to 0 to 1 days/week, per client report, for at least 3 consecutive months.  Progress: 20% Short Term Goals:  Pt to verbally express understanding of the relationship between feelings of depression and their impact on thinking patterns and behaviors.  Pt to verbalize an understanding of the role that distorted thinking plays in creating fears, excessive worry, and ruminations.  Progress: 20% Target Date:  07/25/2023 Frequency:  Bi-weekly Modality:  Cognitive Behavioral Therapy Interventions by Therapist:  Therapist will use CBT, Mindfulness exercises, Coping skills and Referrals, as needed by client. Client has verbally approved this treatment plan.  Karie Kirks, [facility]

## 2023-03-09 NOTE — Progress Notes (Signed)
HPI: Mr. [place] Madden is a 63 y.o.male with a PMHx significant for vitamin D deficiency, CAD, HLD, paroxysmal atrial fibrillation, hearing loss, fatty liver, prediabetes, and OSA, among some who here today for his routine physical examination.  Last CPE: 05/06/2021 He follows regularly with cardiology and last saw them on 02/09/2023. He is also seeing psychotherapy and last saw them on 02/06/2023.   Exercise: Patient states he walks 3x per week and plays racquetball once per week.  Diet: He cooks at home and eats mainly chicken and fish. He eats vegetables daily, and snacks on blueberries, carrots, crackers, and cheese.  Sleep: ~7 hours per night.  Alcohol Use: none Smoking: never Vision: UTD on routine vision care.  Dental: UTD on routine dental care.   Immunization History  Administered Date(s) Administered   Influenza, Seasonal, Injecte, Preservative Fre 03/12/2023   Influenza,inj,Quad PF,6+ Mos 12/15/2018, 11/15/2021   Influenza-Unspecified 11/20/2020   PFIZER(Purple Top)SARS-COV-2 Vaccination 05/27/2019, 06/24/2019, 05/21/2020   Tdap 12/18/2008, 03/30/2020   Zoster Recombinant(Shingrix) 03/30/2020, 05/25/2020   Health Maintenance  Topic Date Due   Pneumococcal Vaccine 84-70 Years old (1 of 2 - PCV) Never done   HIV Screening  Never done   COVID-19 Vaccine (4 - 2024-25 season) 10/22/2022   DTaP/Tdap/Td (3 - Td or Tdap) 03/30/2030   Colonoscopy  08/21/2032   INFLUENZA VACCINE  Completed   Hepatitis C Screening  Completed   Zoster Vaccines- Shingrix  Completed   HPV VACCINES  Aged Out   Last prostate ca screening: 2023 He mentions he has two brothers who had prostate cancer. One passed away from prostate cancer at 84, and the other is still alive and was dx'ed in his 42s. Denies nocturia, but says he needs to use the bathroom when he gets up in the morning.   Lab Results  Component Value Date   PSA 0.73 05/06/2021   PSA 0.90 03/30/2020   Chronic medical problems:    CAD/PAF:  Currently on Plavix 75 mg daily, diltiazem 30 mg prn, and aspirin 81 mg every other days.  He has been having some left sided chest pain and numbness and swelling in his left hand. Not related to exertion, no associated palpitations, diaphoresis, dyspnea.  He has not identified exacerbating or alleviating factors.  He has an appointment with cardiology this week.    He also mentions he is concerned about his past history of "vasovagal vein spasm", which has given him episodes of "aura" every 16-18 months for the last 20 years. He has also had 2 episodes of syncope over the last 2 years.  He has a pacemaker due to pause seen on cardiac loop and syncopal episodes.  Denies orthopnea or PND, gross hematuria,or foam in his urine.   -initial Ca score 166 in 2022 -coronary CT 06/2021 with Ca score 260, CAD in mid LAD that was severe (70-99%) with FFR positive at 0.60.  -cath 06/2021 with 95% mid LAD lesion s/p 18 x 2.75 mm DES -myo perfusion with lexiscan stress test 12//2023: Normal study, low risk.  OSA:  He sleeps with a CPAP.   Hyperlipidemia: Currently on zetia 10 mg daily and rosuvastatin 40 mg daily.  Side effects from medication: none Lab Results  Component Value Date   CHOL 116 01/03/2022   HDL 51 01/03/2022   LDLCALC 52 01/03/2022   TRIG 60 01/03/2022   CHOLHDL 2.3 01/03/2022   Prediabetes:  Lab Results  Component Value Date   HGBA1C 5.4 02/27/2022   Fatty  liver disease:  He expresses some concern over his liver given his history of fatty liver disease. He had a liver ultrasound in 04/2021.  Lab Results  Component Value Date   ALT 39 04/06/2022   AST 28 04/06/2022   ALKPHOS 100 04/06/2022   BILITOT 0.3 04/06/2022   Vitamin D deficiency: He takes a multivitamin with vitamin D.  Lab Results  Component Value Date   VD25OH 31.0 03/20/2022   Patient also states he is concerned about his metabolism, and believes it may be related to some recent weight gain. He  says he has gained 20 lbs over the last 4 months. He has followed with the [facility] weight loss clinic in the past, but stopped.  He would like cortisol level measure, reports a lot of stress and concerned about this being negatively affecting metabolism and therefore causing wt gain.  Review of Systems  Constitutional:  Positive for fatigue. Negative for activity change, appetite change and fever.  HENT:  Negative for nosebleeds, sore throat and trouble swallowing.   Eyes:  Negative for redness and visual disturbance.  Respiratory:  Negative for cough, shortness of breath and wheezing.   Cardiovascular:  Positive for chest pain (For some time, has seen cardiologist and has an appt later this week). Negative for palpitations and leg swelling.  Gastrointestinal:  Negative for abdominal pain, blood in stool, nausea and vomiting.  Endocrine: Negative for cold intolerance, heat intolerance, polydipsia, polyphagia and polyuria.  Genitourinary:  Negative for decreased urine volume, dysuria, genital sores, hematuria and testicular pain.  Musculoskeletal:  Negative for gait problem and myalgias.  Skin:  Negative for color change and rash.  Neurological:  Positive for numbness. Negative for syncope, weakness and headaches.  Hematological:  Negative for adenopathy. Does not bruise/bleed easily.  Psychiatric/Behavioral:  Negative for confusion and hallucinations. The patient is nervous/anxious.    Current Outpatient Medications on File Prior to Visit  Medication Sig Dispense Refill   acetaminophen (TYLENOL) 500 MG tablet Take 500-1,000 mg by mouth every 6 (six) hours as needed (pain.).     aspirin-acetaminophen-caffeine (EXCEDRIN MIGRAINE) 250-250-65 MG tablet Take 1-2 tablets by mouth 2 (two) times daily as needed for headache.     clopidogrel (PLAVIX) 75 MG tablet Take 1 tablet (75 mg total) by mouth daily. 90 tablet 2   diltiazem (CARDIZEM) 30 MG tablet Take 1 tablet (30 mg total) by mouth 4 (four)  times daily as needed. 30 tablet 3   ezetimibe (ZETIA) 10 MG tablet TAKE 1 TABLET BY MOUTH EVERY DAY 90 tablet 3   ibuprofen (ADVIL) 200 MG tablet Take 400-600 mg by mouth every 8 (eight) hours as needed (pain.).     Multiple Vitamins-Minerals (MULTI FOR HIM) TABS Take 1 tablet by mouth in the morning.     Polyethyl Glycol-Propyl Glycol (LUBRICANT EYE DROPS) 0.4-0.3 % SOLN Place 1-2 drops into both eyes 3 (three) times daily as needed (dry/irritated eyes.).     rosuvastatin (CRESTOR) 40 MG tablet Take 1 tablet (40 mg total) by mouth daily. 90 tablet 3   nitroGLYCERIN (NITROSTAT) 0.4 MG SL tablet Place 1 tablet (0.4 mg total) under the tongue every 5 (five) minutes as needed for chest pain. 90 tablet 3   No current facility-administered medications on file prior to visit.    Past Medical History:  Diagnosis Date   CAD (coronary artery disease) 07/13/2021   CCTA 06/2021: CAC score 260 (84th percentile); mLAD 70-99; mLAD FFR pos (0.60) Status post DES to  the mid LAD 06/2021   Carotid stenosis 08/11/2021   Carotid US 07/2021: Bilat ICA 1-39   Deafness in right ear    Family history of pancreatic cancer    Family history of prostate cancer    Family history of skin cancer    Fatty liver    High cholesterol    Hyperlipidemia LDL goal <70 08/20/2018   PAF (paroxysmal atrial fibrillation) (HCC) 05/06/2021   Pre-diabetes    Sleep apnea    Vitamin B 12 deficiency    Vitamin D deficiency    Past Surgical History:  Procedure Laterality Date   CARDIAC CATHETERIZATION     CORONARY STENT INTERVENTION N/A 07/15/2021   Procedure: CORONARY STENT INTERVENTION;  Surgeon: Lyn Records, MD;  Location: [facility] INVASIVE CV LAB;  Service: Cardiovascular;  Laterality: N/A;   CORONARY ULTRASOUND/IVUS N/A 07/15/2021   Procedure: Intravascular Ultrasound/IVUS;  Surgeon: Lyn Records, MD;  Location: [facility] INVASIVE CV LAB;  Service: Cardiovascular;  Laterality: N/A;   INSERT / REPLACE / REMOVE PACEMAKER     LEFT  HEART CATH AND CORONARY ANGIOGRAPHY N/A 07/15/2021   Procedure: LEFT HEART CATH AND CORONARY ANGIOGRAPHY;  Surgeon: Lyn Records, MD;  Location: [facility] INVASIVE CV LAB;  Service: Cardiovascular;  Laterality: N/A;   LOOP RECORDER REMOVAL N/A 09/01/2021   Procedure: LOOP RECORDER REMOVAL;  Surgeon: Regan Lemming, MD;  Location: [facility] INVASIVE CV LAB;  Service: Cardiovascular;  Laterality: N/A;   NASAL SEPTUM SURGERY     PACEMAKER IMPLANT N/A 09/01/2021   Procedure: PACEMAKER IMPLANT;  Surgeon: Regan Lemming, MD;  Location: [facility] INVASIVE CV LAB;  Service: Cardiovascular;  Laterality: N/A;   Allergies  Allergen Reactions   Bee Venom Anaphylaxis   Family History  Problem Relation Age of Onset   High Cholesterol Mother    Heart attack Father    High Cholesterol Father    High blood pressure Father    Sudden death Father    Skin cancer Sister    Prostate cancer Brother        dx 69   Prostate cancer Brother        dx 46, metastatic, d 60   Pancreatic cancer Maternal Grandmother        dx 69s, d 55s    Social History   Socioeconomic History   Marital status: Married    Spouse name: [name]   Number of children: 4   Years of education: Social worker   Highest education level: Professional school degree (e.g., MD, DDS, DVM, JD)  Occupational History   Occupation: [employer]    Comment: [employer]  Tobacco Use   Smoking status: Never   Smokeless tobacco: Never  Vaping Use   Vaping status: Never Used  Substance and Sexual Activity   Alcohol use: No    Comment: quit: 2011   Drug use: No   Sexual activity: Yes  Other Topics Concern   Not on file  Social History Narrative   Right handed   Patient lives at home with his family.   Caffeine Use: 2 cups daily   Social Drivers of Health   Financial Resource Strain: Not on file  Food Insecurity: Not on file  Transportation Needs: Not on file  Physical Activity: Not on file  Stress: Not on file (12/27/2022)  Social Connections: Not on  file   Vitals:   03/12/23 0704  BP: 110/74  Pulse: 79  Resp: 16  Temp: 98.3 F (36.8 C)  SpO2: 96%  Body mass index is 33.75 kg/m.  Wt Readings from Last 3 Encounters:  03/12/23 209 lb 2 oz (94.9 kg)  02/09/23 209 lb 6.4 oz (95 kg)  11/21/22 199 lb (90.3 kg)   Physical Exam Vitals and nursing note reviewed.  Constitutional:      General: He is not in acute distress.    Appearance: He is well-developed.  HENT:     Head: Normocephalic and atraumatic.     Right Ear: Tympanic membrane, ear canal and external ear normal.     Left Ear: Tympanic membrane, ear canal and external ear normal.     Mouth/Throat:     Mouth: Mucous membranes are moist.     Pharynx: Oropharynx is clear. Uvula midline.  Eyes:     Extraocular Movements: Extraocular movements intact.     Conjunctiva/sclera: Conjunctivae normal.     Pupils: Pupils are equal, round, and reactive to light.  Neck:     Thyroid: No thyroid mass or thyromegaly.     Trachea: No tracheal deviation.  Cardiovascular:     Rate and Rhythm: Normal rate and regular rhythm.     Pulses:          Dorsalis pedis pulses are 2+ on the right side and 2+ on the left side.     Heart sounds: No murmur heard. Pulmonary:     Effort: Pulmonary effort is normal. No respiratory distress.     Breath sounds: Normal breath sounds.  Abdominal:     Palpations: Abdomen is soft. There is no hepatomegaly or mass.     Tenderness: There is no abdominal tenderness.  Genitourinary:    Comments: No concerns. Musculoskeletal:        General: No tenderness.     Cervical back: Normal range of motion.     Right lower leg: No edema.     Left lower leg: No edema.     Comments: No signs of synovitis.  Lymphadenopathy:     Cervical: No cervical adenopathy.     Upper Body:     Right upper body: No supraclavicular adenopathy.     Left upper body: No supraclavicular adenopathy.  Skin:    General: Skin is warm.     Findings: No erythema.  Neurological:      General: No focal deficit present.     Mental Status: He is alert and oriented to person, place, and time.     Cranial Nerves: No cranial nerve deficit.     Sensory: No sensory deficit.     Motor: No weakness.     Gait: Gait normal.     Deep Tendon Reflexes:     Reflex Scores:      Bicep reflexes are 2+ on the right side and 2+ on the left side.      Patellar reflexes are 2+ on the right side and 2+ on the left side. Psychiatric:        Mood and Affect: Affect normal. Mood is anxious.   ASSESSMENT AND PLAN:  Mr. Hussein was seen today for his routine general medical examination.   Orders Placed This Encounter  Procedures   US ABDOMEN LIMITED RUQ (LIVER/GB)   Flu vaccine trivalent PF, 6mos and older(Flulaval,Afluria,Fluarix,Fluzone)   PSA   Hepatic function panel   Basic metabolic panel   Hemoglobin A1c   Lipid panel   VITAMIN D 25 Hydroxy (Vit-D Deficiency, Fractures)   C-reactive protein   TSH   CBC   Cortisol, free, Serum  Lab Results  Component Value Date   TSH 2.31 03/12/2023   Lab Results  Component Value Date   WBC 5.3 03/12/2023   HGB 16.6 03/12/2023   HCT 47.2 03/12/2023   MCV 92.2 03/12/2023   PLT 175.0 Repeated and verified X2. 03/12/2023   Lab Results  Component Value Date   PSA 0.96 03/12/2023   PSA 0.73 05/06/2021   PSA 0.90 03/30/2020   Lab Results  Component Value Date   ALT 31 03/12/2023   AST 30 03/12/2023   ALKPHOS 75 03/12/2023   BILITOT 0.5 03/12/2023   Lab Results  Component Value Date   HGBA1C 6.1 03/12/2023   Lab Results  Component Value Date   CRP <1.0 03/12/2023   Lab Results  Component Value Date   CHOL 125 03/12/2023   HDL 50.40 03/12/2023   LDLCALC 58 03/12/2023   TRIG 84.0 03/12/2023   CHOLHDL 2 03/12/2023   Lab Results  Component Value Date   VD25OH 34.04 03/12/2023   Routine general medical examination at a health care facility Assessment & Plan: We discussed the importance of regular physical activity and  healthy diet for prevention of chronic illness and/or complications. Preventive guidelines reviewed. Vaccination: Declined Prevnar vaccination, rest of vaccines up-to-date. Next CPE in a year.   Hepatic steatosis Assessment & Plan: With mildly elevated transaminases. We discussed a few pharmacologic options, including Actos, he is not interested in adding medications at this time. Last RUQ US done in 04/2021. Today hepatic and liver US ordered, further recommendation will be given according to lab results. Continue low-fat diet, limiting alcohol intake, and regular physical activity.  Orders: -     Hepatic function panel; Future -     US ABDOMEN LIMITED RUQ (LIVER/GB); Future -     C-reactive protein; Future -     CBC; Future -     Cortisol, free, Serum; Future  Hyperlipidemia LDL goal <70 Assessment & Plan: Continue rosuvastatin 40 mg daily and Zetia 10 mg daily as well as low-fat diet. Further recommendation will be given according to lipid panel result.  Orders: -     Lipid panel; Future -     Cortisol, free, Serum; Future  Vitamin D deficiency, unspecified Assessment & Plan: Continue current dose of vitamin D supplementation. Further recommendation will be given according to 25 OH vitamin D result.  Orders: -     VITAMIN D 25 Hydroxy (Vit-D Deficiency, Fractures); Future  Screening for endocrine, metabolic and immunity disorder -     Basic metabolic panel; Future -     Hemoglobin A1c; Future  Prostate cancer screening -     PSA; Future  Patient requested diagnostic testing -     Cortisol, free, Serum; Future  Presence of heart assist device [facility]) Assessment & Plan: Hx of syncopal episodes and cardiac pause seen on cardiac monitor. Status post pacemaker placement.   Need for influenza vaccination -     Flu vaccine trivalent PF, 6mos and older(Flulaval,Afluria,Fluarix,Fluzone)  PAF (paroxysmal atrial fibrillation) (HCC) Assessment &  Plan: Asymptomatic. Chadsvasc=1 , so crrently he is not on anticoagulation. He takes diltiazem 30 mg every 4 hours as needed, he has not needed medication in a while. Following with cardiologist.   Class 1 obesity with serious comorbidity and body mass index (BMI) of 33.0 to 33.9 in adult, unspecified obesity type Assessment & Plan: Frustrated because difficulty losing wt. He has been at [facility] and at [facility] and wellness clinic, not interested in  resuming care. States that Doren Custard was suggested at some point but had some difficulty with med shortage and insurance coverage, he is not interested in pharmacologic treatment at this time. Patient understands the benefits of wt loss as well as adverse effects of obesity. Consistency with healthy diet and physical activity encouraged.  Orders: -     TSH; Future   Return in 1 year (on 03/11/2024) for CPE.  I, Rolla Etienne Wierda, acting as a scribe for Kalin Amrhein [place], MD., have documented all relevant documentation on the behalf of Kacey Dysert [place], MD, as directed by  Suresh Audi [place], MD while in the presence of Teaira Croft [place], MD.   I, Jahaad Penado [place], MD, have reviewed all documentation for this visit. The documentation on 03/12/23 for the exam, diagnosis, procedures, and orders are all accurate and complete.  Keli Buehner G. [place], MD  [facility] [place] office.

## 2023-03-12 ENCOUNTER — Ambulatory Visit (INDEPENDENT_AMBULATORY_CARE_PROVIDER_SITE_OTHER): Payer: No Typology Code available for payment source | Admitting: Family Medicine

## 2023-03-12 ENCOUNTER — Encounter: Payer: Self-pay | Admitting: Family Medicine

## 2023-03-12 VITALS — BP 110/74 | HR 79 | Temp 98.3°F | Resp 16 | Ht 66.0 in | Wt 209.1 lb

## 2023-03-12 DIAGNOSIS — Z0000 Encounter for general adult medical examination without abnormal findings: Secondary | ICD-10-CM

## 2023-03-12 DIAGNOSIS — Z23 Encounter for immunization: Secondary | ICD-10-CM | POA: Diagnosis not present

## 2023-03-12 DIAGNOSIS — Z125 Encounter for screening for malignant neoplasm of prostate: Secondary | ICD-10-CM | POA: Diagnosis not present

## 2023-03-12 DIAGNOSIS — E785 Hyperlipidemia, unspecified: Secondary | ICD-10-CM

## 2023-03-12 DIAGNOSIS — Z0189 Encounter for other specified special examinations: Secondary | ICD-10-CM

## 2023-03-12 DIAGNOSIS — Z6833 Body mass index (BMI) 33.0-33.9, adult: Secondary | ICD-10-CM

## 2023-03-12 DIAGNOSIS — Z95811 Presence of heart assist device: Secondary | ICD-10-CM

## 2023-03-12 DIAGNOSIS — Z1329 Encounter for screening for other suspected endocrine disorder: Secondary | ICD-10-CM | POA: Diagnosis not present

## 2023-03-12 DIAGNOSIS — I480 Paroxysmal atrial fibrillation: Secondary | ICD-10-CM | POA: Diagnosis not present

## 2023-03-12 DIAGNOSIS — E559 Vitamin D deficiency, unspecified: Secondary | ICD-10-CM | POA: Diagnosis not present

## 2023-03-12 DIAGNOSIS — Z13228 Encounter for screening for other metabolic disorders: Secondary | ICD-10-CM | POA: Diagnosis not present

## 2023-03-12 DIAGNOSIS — Z130 Encounter for screening for diseases of the blood and blood-forming organs and certain disorders involving the immune mechanism: Secondary | ICD-10-CM | POA: Diagnosis not present

## 2023-03-12 DIAGNOSIS — E66811 Obesity, class 1: Secondary | ICD-10-CM

## 2023-03-12 DIAGNOSIS — K760 Fatty (change of) liver, not elsewhere classified: Secondary | ICD-10-CM | POA: Diagnosis not present

## 2023-03-12 LAB — BASIC METABOLIC PANEL
BUN: 16 mg/dL (ref 6–23)
CO2: 25 meq/L (ref 19–32)
Calcium: 9.4 mg/dL (ref 8.4–10.5)
Chloride: 106 meq/L (ref 96–112)
Creatinine, Ser: 0.95 mg/dL (ref 0.40–1.50)
GFR: 86.44 mL/min (ref >60.00)
Glucose, Bld: 107 mg/dL — ABNORMAL HIGH (ref 70–99)
Potassium: 4.4 meq/L (ref 3.5–5.1)
Sodium: 142 meq/L (ref 135–145)

## 2023-03-12 LAB — TSH: TSH: 2.31 u[IU]/mL (ref 0.35–5.50)

## 2023-03-12 LAB — CBC
HCT: 47.2 % (ref 39.0–52.0)
Hemoglobin: 16.6 g/dL (ref 13.0–17.0)
MCHC: 35.1 g/dL (ref 30.0–36.0)
MCV: 92.2 fL (ref 78.0–100.0)
Platelets: 175 10*3/uL (ref 150.0–400.0)
RBC: 5.12 Mil/uL (ref 4.22–5.81)
RDW: 12.8 % (ref 11.5–15.5)
WBC: 5.3 10*3/uL (ref 4.0–10.5)

## 2023-03-12 LAB — HEMOGLOBIN A1C: Hgb A1c MFr Bld: 6.1 % (ref 4.6–6.5)

## 2023-03-12 LAB — LIPID PANEL
Cholesterol: 125 mg/dL (ref 0–200)
HDL: 50.4 mg/dL (ref >39.00)
LDL Cholesterol: 58 mg/dL (ref 0–99)
NonHDL: 74.78
Total CHOL/HDL Ratio: 2
Triglycerides: 84 mg/dL (ref 0.0–149.0)
VLDL: 16.8 mg/dL (ref 0.0–40.0)

## 2023-03-12 LAB — PSA: PSA: 0.96 ng/mL (ref 0.10–4.00)

## 2023-03-12 LAB — C-REACTIVE PROTEIN: CRP: <1 mg/dL (ref 0.5–20.0)

## 2023-03-12 LAB — HEPATIC FUNCTION PANEL
ALT: 31 U/L (ref 0–53)
AST: 30 U/L (ref 0–37)
Albumin: 4.6 g/dL (ref 3.5–5.2)
Alkaline Phosphatase: 75 U/L (ref 39–117)
Bilirubin, Direct: 0.1 mg/dL (ref 0.0–0.3)
Total Bilirubin: 0.5 mg/dL (ref 0.2–1.2)
Total Protein: 7.1 g/dL (ref 6.0–8.3)

## 2023-03-12 LAB — VITAMIN D 25 HYDROXY (VIT D DEFICIENCY, FRACTURES): VITD: 34.04 ng/mL (ref 30.00–100.00)

## 2023-03-12 NOTE — Assessment & Plan Note (Signed)
We discussed the importance of regular physical activity and healthy diet for prevention of chronic illness and/or complications. Preventive guidelines reviewed. Vaccination: Declined Prevnar vaccination, rest of vaccines up-to-date. Next CPE in a year.

## 2023-03-12 NOTE — Progress Notes (Unsigned)
  Electrophysiology Office Note:   ID:  Brandon Madden, DOB [birth year], MRN [id number]  Primary Cardiologist: Jodelle Red, MD Electrophysiologist: Will Jorja Loa, MD *** {Click to update primary MD,subspecialty MD or APP then REFRESH:1}    History of Present Illness:   Brandon Madden is a 63 y.o. male with h/o *** seen today for {VISITTYPE:28148}  Review of systems complete and found to be negative unless listed in HPI.   EP Information / Studies Reviewed:    {EKGtoday:28818}       PPM Interrogation-  reviewed in detail today,  See PACEART report.  Device History: {INDUSTRY:28136} {Blank single:19197::"Dual Chamber","Single Chamber","BiV","Leadless"} PPM implanted *** for {Blank single:19197::"Sinus Node Dysfunction","CHB","Second Degree AV block","Tachy-Brady syndrome","Uncontrolled atrial arrhyhtmia s/p AV node ablation"}  ***  Physical Exam:   VS:  There were no vitals taken for this visit.   Wt Readings from Last 3 Encounters:  03/12/23 209 lb 2 oz (94.9 kg)  02/09/23 209 lb 6.4 oz (95 kg)  11/21/22 199 lb (90.3 kg)     GEN: No acute distress  NECK: No JVD; No carotid bruits CARDIAC: {EPRHYTHM:28826}, no murmurs, rubs, gallops RESPIRATORY:  Clear to auscultation without rales, wheezing or rhonchi  ABDOMEN: Soft, non-tender, non-distended EXTREMITIES:  {EDEMA LEVEL:28147::"No"} edema; No deformity   ASSESSMENT AND PLAN:    Stokes Adam Syncope  s/p Medtronic PPM  Normal PPM function See Pace Art report No changes today  Paroxysmal AF EKG today shows *** Not currently on OAC with CHA2DS2VASc of at least 1   SVT Short, overall well controlled  CAD Denies s/s ischemia Continue crestor.  Normal Myoview 01/2022  {Click here to Review PMH, Prob List, Meds, Allergies, SHx, FHx  :1}   Disposition:   Follow up with {EPPROVIDERS:28135} {EPFOLLOW UP:28173}  Signed, Graciella Freer, PA-C

## 2023-03-12 NOTE — Assessment & Plan Note (Signed)
Continue current dose of vitamin D supplementation. Further recommendation will be given according to 25 OH vitamin D result. 

## 2023-03-12 NOTE — Assessment & Plan Note (Signed)
Continue rosuvastatin 40 mg daily and Zetia 10 mg daily as well as low-fat diet. Further recommendation will be given according to lipid panel result.

## 2023-03-12 NOTE — Assessment & Plan Note (Addendum)
With mildly elevated transaminases. We discussed a few pharmacologic options, including Actos, he is not interested in adding medications at this time. Last RUQ US done in 04/2021. Today hepatic and liver US ordered, further recommendation will be given according to lab results. Continue low-fat diet, limiting alcohol intake, and regular physical activity.

## 2023-03-12 NOTE — Assessment & Plan Note (Addendum)
Asymptomatic. Chadsvasc=1 , so crrently he is not on anticoagulation. He takes diltiazem 30 mg every 4 hours as needed, he has not needed medication in a while. Following with cardiologist.

## 2023-03-12 NOTE — Patient Instructions (Addendum)
A few things to remember from today's visit:  Routine general medical examination at a health care facility  Hepatic steatosis - Plan: Hepatic function panel, US ABDOMEN LIMITED RUQ (LIVER/GB), C-reactive protein, CBC, Cortisol, free, Serum  Hyperlipidemia LDL goal <70 - Plan: Lipid panel, Cortisol, free, Serum  Vitamin D deficiency, unspecified - Plan: VITAMIN D 25 Hydroxy (Vit-D Deficiency, Fractures)  Screening for endocrine, metabolic and immunity disorder - Plan: Basic metabolic panel, Hemoglobin A1c  Prostate cancer screening - Plan: PSA  Patient requested diagnostic testing - Plan: Cortisol, free, Serum  Obesity (BMI 30.0-34.9) - Plan: TSH  Do not use My Chart to request refills or for acute issues that need immediate attention. If you send a my chart message, it may take a few days to be addressed, specially if I am not in the office.  Please be sure medication list is accurate. If a new problem present, please set up appointment sooner than planned today.  Health Maintenance, Male Adopting a healthy lifestyle and getting preventive care are important in promoting health and wellness. Ask your health care provider about: The right schedule for you to have regular tests and exams. Things you can do on your own to prevent diseases and keep yourself healthy. What should I know about diet, weight, and exercise? Eat a healthy diet  Eat a diet that includes plenty of vegetables, fruits, low-fat dairy products, and lean protein. Do not eat a lot of foods that are high in solid fats, added sugars, or sodium. Maintain a healthy weight Body mass index (BMI) is a measurement that can be used to identify possible weight problems. It estimates body fat based on height and weight. Your health care provider can help determine your BMI and help you achieve or maintain a healthy weight. Get regular exercise Get regular exercise. This is one of the most important things you can do for your  health. Most adults should: Exercise for at least 150 minutes each week. The exercise should increase your heart rate and make you sweat (moderate-intensity exercise). Do strengthening exercises at least twice a week. This is in addition to the moderate-intensity exercise. Spend less time sitting. Even light physical activity can be beneficial. Watch cholesterol and blood lipids Have your blood tested for lipids and cholesterol at 62 years of age, then have this test every 5 years. You may need to have your cholesterol levels checked more often if: Your lipid or cholesterol levels are high. You are older than 62 years of age. You are at high risk for heart disease. What should I know about cancer screening? Many types of cancers can be detected early and may often be prevented. Depending on your health history and family history, you may need to have cancer screening at various ages. This may include screening for: Colorectal cancer. Prostate cancer. Skin cancer. Lung cancer. What should I know about heart disease, diabetes, and high blood pressure? Blood pressure and heart disease High blood pressure causes heart disease and increases the risk of stroke. This is more likely to develop in people who have high blood pressure readings or are overweight. Talk with your health care provider about your target blood pressure readings. Have your blood pressure checked: Every 3-5 years if you are 58-31 years of age. Every year if you are 50 years old or older. If you are between the ages of 42 and 65 and are a current or former smoker, ask your health care provider if you should have a  one-time screening for abdominal aortic aneurysm (AAA). Diabetes Have regular diabetes screenings. This checks your fasting blood sugar level. Have the screening done: Once every three years after age 66 if you are at a normal weight and have a low risk for diabetes. More often and at a younger age if you are  overweight or have a high risk for diabetes. What should I know about preventing infection? Hepatitis B If you have a higher risk for hepatitis B, you should be screened for this virus. Talk with your health care provider to find out if you are at risk for hepatitis B infection. Hepatitis C Blood testing is recommended for: Everyone born from [birth year] through 1964. Anyone with known risk factors for hepatitis C. Sexually transmitted infections (STIs) You should be screened each year for STIs, including gonorrhea and chlamydia, if: You are sexually active and are younger than 62 years of age. You are older than 62 years of age and your health care provider tells you that you are at risk for this type of infection. Your sexual activity has changed since you were last screened, and you are at increased risk for chlamydia or gonorrhea. Ask your health care provider if you are at risk. Ask your health care provider about whether you are at high risk for HIV. Your health care provider may recommend a prescription medicine to help prevent HIV infection. If you choose to take medicine to prevent HIV, you should first get tested for HIV. You should then be tested every 3 months for as long as you are taking the medicine. Follow these instructions at home: Alcohol use Do not drink alcohol if your health care provider tells you not to drink. If you drink alcohol: Limit how much you have to 0-2 drinks a day. Know how much alcohol is in your drink. In the [place] one drink equals one 12 oz bottle of beer (355 mL), one 5 oz glass of wine (148 mL), or one 1 oz glass of hard liquor (44 mL). Lifestyle Do not use any products that contain nicotine or tobacco. These products include cigarettes, chewing tobacco, and vaping devices, such as e-cigarettes. If you need help quitting, ask your health care provider. Do not use street drugs. Do not share needles. Ask your health care provider for help if you need support or  information about quitting drugs. General instructions Schedule regular health, dental, and eye exams. Stay current with your vaccines. Tell your health care provider if: You often feel depressed. You have ever been abused or do not feel safe at home. Summary Adopting a healthy lifestyle and getting preventive care are important in promoting health and wellness. Follow your health care provider's instructions about healthy diet, exercising, and getting tested or screened for diseases. Follow your health care provider's instructions on monitoring your cholesterol and blood pressure. This information is not intended to replace advice given to you by your health care provider. Make sure you discuss any questions you have with your health care provider. Document Revised: 06/28/2020 Document Reviewed: 06/28/2020 Elsevier Patient Education  2024 [employer].

## 2023-03-12 NOTE — Assessment & Plan Note (Addendum)
Hx of syncopal episodes and cardiac pause seen on cardiac monitor. Status post pacemaker placement.

## 2023-03-12 NOTE — Assessment & Plan Note (Signed)
Frustrated because difficulty losing wt. He has been at [facility] and at [facility] and wellness clinic, not interested in resuming care. States that Brandon Madden was suggested at some point but had some difficulty with med shortage and insurance coverage, he is not interested in pharmacologic treatment at this time. Patient understands the benefits of wt loss as well as adverse effects of obesity. Consistency with healthy diet and physical activity encouraged.

## 2023-03-13 ENCOUNTER — Ambulatory Visit: Payer: No Typology Code available for payment source | Attending: Student | Admitting: Student

## 2023-03-13 ENCOUNTER — Encounter: Payer: Self-pay | Admitting: Student

## 2023-03-13 VITALS — BP 122/72 | HR 86 | Ht 66.0 in | Wt 212.2 lb

## 2023-03-13 DIAGNOSIS — I480 Paroxysmal atrial fibrillation: Secondary | ICD-10-CM | POA: Diagnosis not present

## 2023-03-13 DIAGNOSIS — R55 Syncope and collapse: Secondary | ICD-10-CM

## 2023-03-13 DIAGNOSIS — I2510 Atherosclerotic heart disease of native coronary artery without angina pectoris: Secondary | ICD-10-CM

## 2023-03-13 DIAGNOSIS — Z955 Presence of coronary angioplasty implant and graft: Secondary | ICD-10-CM

## 2023-03-13 LAB — CUP PACEART INCLINIC DEVICE CHECK
Battery Remaining Longevity: 161 mo
Battery Voltage: 3.05 V
Brady Statistic AP VP Percent: 5.84 %
Brady Statistic AP VS Percent: 2.17 %
Brady Statistic AS VP Percent: 0.07 %
Brady Statistic AS VS Percent: 91.93 %
Brady Statistic RA Percent Paced: 7.98 %
Brady Statistic RV Percent Paced: 5.9 %
Date Time Interrogation Session: 20250121092536
Implantable Lead Connection Status: 753985
Implantable Lead Connection Status: 753985
Implantable Lead Implant Date: 20230713
Implantable Lead Implant Date: 20230713
Implantable Lead Location: 753859
Implantable Lead Location: 753860
Implantable Lead Model: 3830
Implantable Lead Model: 5076
Implantable Pulse Generator Implant Date: 20230713
Lead Channel Impedance Value: 342 Ohm
Lead Channel Impedance Value: 399 Ohm
Lead Channel Impedance Value: 456 Ohm
Lead Channel Impedance Value: 475 Ohm
Lead Channel Pacing Threshold Amplitude: 0.5 V
Lead Channel Pacing Threshold Amplitude: 0.875 V
Lead Channel Pacing Threshold Pulse Width: 0.4 ms
Lead Channel Pacing Threshold Pulse Width: 0.4 ms
Lead Channel Sensing Intrinsic Amplitude: 13 mV
Lead Channel Sensing Intrinsic Amplitude: 14.75 mV
Lead Channel Sensing Intrinsic Amplitude: 2.125 mV
Lead Channel Sensing Intrinsic Amplitude: 2.5 mV
Lead Channel Setting Pacing Amplitude: 1.5 V
Lead Channel Setting Pacing Amplitude: 2 V
Lead Channel Setting Pacing Pulse Width: 0.4 ms
Lead Channel Setting Sensing Sensitivity: 1.2 mV
Zone Setting Status: 755011

## 2023-03-13 NOTE — Patient Instructions (Signed)
Medication Instructions:  Your physician recommends that you continue on your current medications as directed. Please refer to the Current Medication list given to you today.  *If you need a refill on your cardiac medications before your next appointment, please call your pharmacy*  Lab Work: None ordered If you have labs (blood work) drawn today and your tests are completely normal, you will receive your results only by: MyChart Message (if you have MyChart) OR A paper copy in the mail If you have any lab test that is abnormal or we need to change your treatment, we will call you to review the results.  Follow-Up: At [facility], you and your health needs are our priority.  As part of our continuing mission to provide you with exceptional heart care, we have created designated Provider Care Teams.  These Care Teams include your primary Cardiologist (physician) and Advanced Practice Providers (APPs -  Physician Assistants and Nurse Practitioners) who all work together to provide you with the care you need, when you need it.  Your next appointment:   1 year(s)  Provider:   Loman Brooklyn, MD

## 2023-03-16 ENCOUNTER — Ambulatory Visit
Admission: RE | Admit: 2023-03-16 | Discharge: 2023-03-16 | Disposition: A | Discharge status: Home / Self Care | Payer: No Typology Code available for payment source | Source: Ambulatory Visit | Attending: Family Medicine | Admitting: Family Medicine

## 2023-03-16 ENCOUNTER — Encounter: Payer: Self-pay | Admitting: Family Medicine

## 2023-03-16 DIAGNOSIS — K760 Fatty (change of) liver, not elsewhere classified: Secondary | ICD-10-CM

## 2023-03-19 LAB — CORTISOL, FREE: Cortisol Free, Ser: 0.38 ug/dL

## 2023-04-06 NOTE — Progress Notes (Signed)
Remote pacemaker transmission.

## 2023-04-09 ENCOUNTER — Encounter (HOSPITAL_BASED_OUTPATIENT_CLINIC_OR_DEPARTMENT_OTHER): Payer: Self-pay

## 2023-04-10 ENCOUNTER — Encounter (HOSPITAL_BASED_OUTPATIENT_CLINIC_OR_DEPARTMENT_OTHER): Payer: Self-pay | Admitting: Cardiology

## 2023-04-10 ENCOUNTER — Encounter (HOSPITAL_BASED_OUTPATIENT_CLINIC_OR_DEPARTMENT_OTHER): Payer: Self-pay

## 2023-04-10 ENCOUNTER — Ambulatory Visit (HOSPITAL_BASED_OUTPATIENT_CLINIC_OR_DEPARTMENT_OTHER): Payer: No Typology Code available for payment source | Admitting: Cardiology

## 2023-04-10 VITALS — BP 118/66 | HR 80 | Ht 66.0 in | Wt 210.0 lb

## 2023-04-10 DIAGNOSIS — K760 Fatty (change of) liver, not elsewhere classified: Secondary | ICD-10-CM | POA: Diagnosis not present

## 2023-04-10 DIAGNOSIS — Z713 Dietary counseling and surveillance: Secondary | ICD-10-CM

## 2023-04-10 DIAGNOSIS — I480 Paroxysmal atrial fibrillation: Secondary | ICD-10-CM

## 2023-04-10 DIAGNOSIS — Z7182 Exercise counseling: Secondary | ICD-10-CM

## 2023-04-10 DIAGNOSIS — Z955 Presence of coronary angioplasty implant and graft: Secondary | ICD-10-CM | POA: Diagnosis not present

## 2023-04-10 DIAGNOSIS — I2510 Atherosclerotic heart disease of native coronary artery without angina pectoris: Secondary | ICD-10-CM | POA: Diagnosis not present

## 2023-04-10 DIAGNOSIS — E8881 Metabolic syndrome: Secondary | ICD-10-CM

## 2023-04-10 NOTE — Progress Notes (Signed)
Cardiology Office Note:  .   Date:  04/10/2023  ID:  Brandon Madden, DOB 1960-05-29, MRN [id number] PCP: [place], Betty G, MD  [facility] Providers Cardiologist:  Jodelle Red, MD Electrophysiologist:  Will Jorja Loa, MD {  History of Present Illness: .   Brandon Madden is a 63 y.o. male with PMH hyperlipidemia, palpitations. He was previously seen by Dr. Shari Prows and established care with me on 02/09/23.  Today: Has been working on lifestyle since our last visit. Has a program through his work focused on lifestyle change. [facility]yr[facility] Lass is the name of the program. Working to increase exercise. Has been traveling, which has limited activity. Still has a lot of work stress.   Has continued to have chest pain/palpitations intermittently, though not getting worse. Does feel that work pattern/stress can make for worse days.  Saw Otilio Saber 03/13/23 with EP, discussed vasovagal events. Asking about [facility] programs for vagal tone/ablation. Discussed general recommendations.   Discussed limited data on clinical recommendations/management of microbiome/metabolic disease. Discussed ketogenic diet, intermittent fasting, low FODMAPS, and limited data on other strategies. He wishes to manage as much risk as he can without medications.  Reviewed his labs from 03/12/23. A1c 6.1, LDL 58, TG 84.  ROS: Denies shortness of breath at rest or with normal exertion. No PND, orthopnea, LE edema or unexpected weight gain. No syncope. ROS otherwise negative except as noted.   Studies Reviewed: Marland Kitchen    EKG:       Physical Exam:   VS:  BP 118/66 (BP Location: Right Arm, Patient Position: Sitting, Cuff Size: Normal)   Pulse 80   Ht 5\' 6"  (1.676 m)   Wt 210 lb (95.3 kg)   SpO2 96%   BMI 33.89 kg/m    Wt Readings from Last 3 Encounters:  04/10/23 210 lb (95.3 kg)  03/13/23 212 lb 3.2 oz (96.3 kg)  03/12/23 209 lb 2 oz (94.9 kg)    GEN: Well nourished, well developed in no acute distress HEENT:  Normal, moist mucous membranes NECK: No JVD CARDIAC: regular rhythm, normal S1 and S2, no rubs or gallops. No murmur. VASCULAR: Radial and DP pulses 2+ bilaterally. No carotid bruits RESPIRATORY:  Clear to auscultation without rales, wheezing or rhonchi  ABDOMEN: Soft, non-tender, non-distended MUSCULOSKELETAL:  Ambulates independently SKIN: Warm and dry, no edema NEUROLOGIC:  Alert and oriented x 3. No focal neuro deficits noted. PSYCHIATRIC:  Normal affect      ASSESSMENT AND PLAN: .    CAD s/p PCI to LAD 2023 Nonobstructive carotid plaque -initial Ca score 166 in 2022 -coronary CT 06/2021 with Ca score 260, CAD in mid LAD that was severe (70-99%) with FFR positive at 0.60.  -cath 06/2021 with 95% mid LAD lesion s/p 18 x 2.75 mm DES -myoview 02/2021 without ischemia -continue clopidogrel 75 mg daily -continue rosuvastatin 40 mg daily, ezetimibe 10 mg daily  Metabolic syndrome, with CAD, obesity, hepatic steatosis -focused conversation today on strategies. Reviewed his recent lipids, A1c. He is working with [place] program through his employer, sounds as though initial step is keto diet based on their recommendations/monitoring. We did discuss that in some cases keto can make lipids highly abnormal, recommend monitoring in about 3 mos -asks thoughtful questions re: data on diet strategies. I frankly discussed the limited data for one program over another. We had discussed Mediterranean diet at last visit, but there is not a current clinical program that uses microbiome based monitoring/treatment of diet.  -if keto is not  a good fit for him, we briefly discussed a low FODMAP diet as he is concerned about the role of inflammation. Did mention that this can be a restrictive diet for many. -overall recommend whole food/Mediterranean based strategy as much as possible with increase in activity as able  Palpitations, syncope Paroxysmal atrial fibrillation -followed by Dr. Elberta Fortis. Chadsvasc=1.  Has PRN diltiazem -s/p DC-PPM after pauses seen on ILR. -he has heard about programs through [facility] related to vagal tone as well as potential ablation strategies. -we discussed some of the general strategies for dysautonomia, which is not his diagnosis but also commonly overlaps with vagal episodes. Reviewed exercise recommendations and gave a specific exercise program that we recommend for severely syncopal/symptomatic patients such as POTS -he does not wish to pursue [facility] evaluation at this time but I would support this in the future if he remains highly symptomatic  OSA -continue CPAP  Dispo: 3 mos, lipids prior  Total time of encounter: I spent 53 minutes dedicated to the care of this patient on the date of this encounter to include pre-visit review of records, 35 minutes in face-to-face time with the patient discussing conditions above, and 18 minutes in clinical documentation with the electronic health record and sending scientific literature via mychart. We specifically spent time today discussing recent test results, current limited clinical recommendations on diets for metabolic syndrome but did review available guidelines, discussion of vasovagal events, management strategies.   Signed, Jodelle Red, MD   Jodelle Red, MD, PhD, [facility]  [facility]  [place]  [facility] V[facility] at [facility] at [facility] [address] [place], [place] [address] [phone]

## 2023-04-10 NOTE — Patient Instructions (Addendum)
Medication Instructions:  Your physician recommends that you continue on your current medications as directed. Please refer to the Current Medication list given to you today.   Lab Work: Your physician recommends that you return for lab work in 3 months: LIPIDS   Follow-Up: At [facility], you and your health needs are our priority.  As part of our continuing mission to provide you with exceptional heart care, we have created designated Provider Care Teams.  These Care Teams include your primary Cardiologist (physician) and Advanced Practice Providers (APPs -  Physician Assistants and Nurse Practitioners) who all work together to provide you with the care you need, when you need it.  We recommend signing up for the patient portal called "MyChart".  Sign up information is provided on this After Visit Summary.  MyChart is used to connect with patients for Virtual Visits (Telemedicine).  Patients are able to view lab/test results, encounter notes, upcoming appointments, etc.  Non-urgent messages can be sent to your provider as well.   To learn more about what you can do with MyChart, go to [email or web address].    Your next appointment:   3 month(s)  The format for your next appointment:   In Person  Provider:   Jodelle Red, MD   Other Instructions: [email or web address]

## 2023-04-12 ENCOUNTER — Ambulatory Visit: Payer: No Typology Code available for payment source | Admitting: Psychology

## 2023-04-12 DIAGNOSIS — F4321 Adjustment disorder with depressed mood: Secondary | ICD-10-CM | POA: Diagnosis not present
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
## 2023-05-17 ENCOUNTER — Ambulatory Visit: Payer: No Typology Code available for payment source | Admitting: Psychology

## 2023-05-17 DIAGNOSIS — F4321 Adjustment disorder with depressed mood: Secondary | ICD-10-CM | POA: Diagnosis not present

## 2023-05-17 NOTE — Progress Notes (Signed)
 [facility] Counselor/Therapist Progress Note  Patient ID: Brandon Madden, MRN: [id number],    Date: 05/17/2023  Time Spent: 45 mins; start time: 1615; end time: 1700  Treatment Type: Individual Therapy  Reported Symptoms: Pt presents for session in person in the office, granting consent for the session.  Mental Status Exam: Appearance:  Casual     Behavior: Appropriate  Motor: Normal  Speech/Language:  Clear and Coherent  Affect: Appropriate  Mood: normal  Thought process: normal  Thought content:   WNL  Sensory/Perceptual disturbances:   WNL  Orientation: oriented to person, place, and time/date  Attention: Good  Concentration: Good  Memory: WNL  Fund of knowledge:  Good  Insight:   Good  Judgment:  Good  Impulse Control: Good   Risk Assessment: Danger to Self:  No Self-injurious Behavior: No Danger to Others: No Duty to Warn:no Physical Aggression / Violence:No  Access to Firearms a concern: No  Gang Involvement:No   Subjective: Pt shares that "work has been really busy; 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  I am not sure what to do about that.  I am having trouble losing the weight and I am having sleep issues because of work."  Pt shares he is waking up at 3am and he is pretty successful falling back to sleep.  Pt shares that he and his family have been travelling; they have been back to [place], to [place], to [place] to see [name]'s dad, went to [place] to see his mom for her 95th birthday, etc.  He has not had an opportunity to do very much self care because of these travels.  Pt knows he has more work to do than he has time to get it done it.  Pt shares the kids are doing well; Domingo Dimes is in the [place] for 2 wks; Montez Morita is doing well in school ([employer]); Jean Rosenthal is doing well-last two months of HS and has a class trip to [place] next week; [name] is struggling with the girls in HS but is managing OK.   Encouraged pt to be as intentional as possible about carving out time for self care between now and our follow up session; we will meet in 6 wks for a follow up session, due to my vacation.  Interventions: Cognitive Behavioral Therapy  Diagnosis:Adjustment disorder with depressed mood  Plan: Treatment Plan Strengths/Abilities:  Intelligent, Intuitive, Willing to participate in therapy Treatment Preferences:  Outpatient Individual Therapy Statement of Needs:  Patient is to use CBT, mindfulness and coping skills to help manage and/or decrease symptoms associated with their diagnosis. Symptoms:  Depressed/Irritable mood, worry, social withdrawal Problems Addressed:  Depressive thoughts, Sadness, Sleep issues, etc. Long Term Goals:  Pt to reduce overall level, frequency, and intensity of the feelings of depression as evidenced by decreased irritability, negative self talk, and helpless feelings from 6 to 7 days/week to 0 to 1 days/week, per client report, for at least 3 consecutive months.  Progress: 20% Short Term Goals:  Pt to verbally express understanding of the relationship between feelings of depression and their impact on thinking patterns and behaviors.  Pt to verbalize an understanding of the role that distorted thinking plays in creating fears, excessive worry, and ruminations.  Progress: 20% Target Date:  07/25/2023 Frequency:  Bi-weekly Modality:  Cognitive Behavioral Therapy Interventions by Therapist:  Therapist will use CBT, Mindfulness exercises, Coping skills and Referrals, as needed by client. Client has verbally  approved this treatment plan.  Karie Kirks, [facility]

## 2023-05-28 ENCOUNTER — Encounter: Payer: Self-pay | Admitting: Cardiology

## 2023-05-28 NOTE — Telephone Encounter (Signed)
 Remote transmission received. Alert 05/28/23 for A-flutter w/ RVR, longest in duration 3 min 31 sec. Please see mychart message for further details.

## 2023-06-01 ENCOUNTER — Ambulatory Visit (INDEPENDENT_AMBULATORY_CARE_PROVIDER_SITE_OTHER): Payer: BC Managed Care – PPO

## 2023-06-01 DIAGNOSIS — I442 Atrioventricular block, complete: Secondary | ICD-10-CM

## 2023-06-03 LAB — CUP PACEART REMOTE DEVICE CHECK
Battery Remaining Longevity: 158 mo
Battery Voltage: 3.04 V
Brady Statistic AP VP Percent: 6.56 %
Brady Statistic AP VS Percent: 1.45 %
Brady Statistic AS VP Percent: 0.09 %
Brady Statistic AS VS Percent: 91.91 %
Brady Statistic RA Percent Paced: 7.9 %
Brady Statistic RV Percent Paced: 6.72 %
Date Time Interrogation Session: 20250410051600
Implantable Lead Connection Status: 753985
Implantable Lead Connection Status: 753985
Implantable Lead Implant Date: 20230713
Implantable Lead Implant Date: 20230713
Implantable Lead Location: 753859
Implantable Lead Location: 753860
Implantable Lead Model: 3830
Implantable Lead Model: 5076
Implantable Pulse Generator Implant Date: 20230713
Lead Channel Impedance Value: 342 Ohm
Lead Channel Impedance Value: 399 Ohm
Lead Channel Impedance Value: 437 Ohm
Lead Channel Impedance Value: 494 Ohm
Lead Channel Pacing Threshold Amplitude: 0.5 V
Lead Channel Pacing Threshold Amplitude: 0.875 V
Lead Channel Pacing Threshold Pulse Width: 0.4 ms
Lead Channel Pacing Threshold Pulse Width: 0.4 ms
Lead Channel Sensing Intrinsic Amplitude: 14 mV
Lead Channel Sensing Intrinsic Amplitude: 14 mV
Lead Channel Sensing Intrinsic Amplitude: 2.875 mV
Lead Channel Sensing Intrinsic Amplitude: 2.875 mV
Lead Channel Setting Pacing Amplitude: 1.5 V
Lead Channel Setting Pacing Amplitude: 2 V
Lead Channel Setting Pacing Pulse Width: 0.4 ms
Lead Channel Setting Sensing Sensitivity: 1.2 mV
Zone Setting Status: 755011

## 2023-06-04 ENCOUNTER — Telehealth: Payer: Self-pay

## 2023-06-04 NOTE — Telephone Encounter (Signed)
 Patient states that he has been having the following symptoms in recent days:   Slight chest discomfort at times SOB with Exertion Some noted fatigue Watch indicates pulse rates 40's - 120's   Updated transmission: Back in SR -  AS/VS  85-113 PAC's  ATRIAL EVENTS: (symptomatic) 4/7 - 3 minutes of AFL/RVR 4/10 - 2 hours of Afib/RVR rates max 176 4/12 - Multiple AT events 1:1 A/V rates 150-170's total that day: approx 33 minutes between 1pm - 8 pm.   He is not taking diltiazem.  Only on crestor, zetia and plavix prescription meds.  Discussed PRN versus scheduled diltiazem use.  Patient will defer to Dr. Lawana Pray.

## 2023-06-04 NOTE — Telephone Encounter (Signed)
 ongoing AF episode since 05/28/23.  Presenting rhythm: AF, VS, occasional VP.   1 AHR, 1 SVT consistent with AFL with RVR, multiple other SVT episodes consistent with ST.  905 rate drop response episodes.     Known history of PAF, no OAC indicated per Epic.

## 2023-06-04 NOTE — Telephone Encounter (Signed)
 Noted, patient is on vacation.  I have made him an appt with Michaelle Adolphus PA-C on Monday, April 21st at 8am.  Will send him the information through [facility].

## 2023-06-04 NOTE — Telephone Encounter (Signed)
 Called to speak with patient regarding AF and elevated ventricular rates. Would like to get updated transmission to further assess how much AF and may need to consider AF clinic referral.   Patient out at breakfast.  He will call back after finishing.

## 2023-06-05 NOTE — Telephone Encounter (Signed)
 Noted. Will discuss definitive management of his AF including AADs and ablation at that visit. No immediate indication for OAC given CHA2DS2VASc of only 1 currently.

## 2023-06-10 NOTE — Progress Notes (Unsigned)
  Electrophysiology Office Note:   ID:  Brandon Madden, DOB [birth year], MRN [id number]  Primary Cardiologist: Sheryle Donning, MD Electrophysiologist: Lei Pump, MD  {Click to update primary MD,subspecialty MD or APP then REFRESH:1}    History of Present Illness:   Brandon Madden is a 63 y.o. male with h/o h/o syncope with vagal episodes and pauses on loop recorder, PPM, PAF, SVT, and coronary artery calcifications seen today for acute visit due to symptomatic SVT on device.    Patient reports ***.  he denies chest pain, palpitations, dyspnea, PND, orthopnea, nausea, vomiting, dizziness, syncope, edema, weight gain, or early satiety.   Review of systems complete and found to be negative unless listed in HPI.   EP Information / Studies Reviewed:    EKG is ordered today. Personal review as below.       PPM Interrogation-  reviewed in detail today,  See PACEART report.  Arrhythmia/Device History Medtronic Dual Chamber PPM implanted 08/2021 for Bradycardia and pauses associated with syncope Prior MDT ILR extracted   Physical Exam:   VS:  There were no vitals taken for this visit.   Wt Readings from Last 3 Encounters:  04/10/23 210 lb (95.3 kg)  03/13/23 212 lb 3.2 oz (96.3 kg)  03/12/23 209 lb 2 oz (94.9 kg)     GEN: No acute distress  NECK: No JVD; No carotid bruits CARDIAC: {EPRHYTHM:28826}, no murmurs, rubs, gallops RESPIRATORY:  Clear to auscultation without rales, wheezing or rhonchi  ABDOMEN: Soft, non-tender, non-distended EXTREMITIES:  {EDEMA LEVEL:28147::"No"} edema; No deformity   ASSESSMENT AND PLAN:    Symptomatic bradycardia s/p Medtronic PPM  Normal PPM function See Pace Art report No changes today Amio and tikosyn are only good options -> Ablation.   Paroxysmal AF Episodes *** symptoms EKG today shows *** CHA2DS2/VASc is 1 currently.   SVT AT ***  CAD No 1c AAD given h/o DES to LAD No s/s ischemia  {Click here to Review PMH, Prob List,  Meds, Allergies, SHx, FHx  :1}   Disposition:   Follow up with {EPPROVIDERS:28135} {EPFOLLOW UP:28173}  Signed, Tylene Galla, PA-C

## 2023-06-11 ENCOUNTER — Ambulatory Visit: Attending: Student | Admitting: Student

## 2023-06-11 ENCOUNTER — Encounter: Payer: Self-pay | Admitting: Student

## 2023-06-11 VITALS — BP 100/74 | HR 86 | Ht 66.0 in | Wt 210.4 lb

## 2023-06-11 DIAGNOSIS — I480 Paroxysmal atrial fibrillation: Secondary | ICD-10-CM | POA: Diagnosis not present

## 2023-06-11 DIAGNOSIS — R001 Bradycardia, unspecified: Secondary | ICD-10-CM

## 2023-06-11 DIAGNOSIS — I471 Supraventricular tachycardia, unspecified: Secondary | ICD-10-CM | POA: Diagnosis not present

## 2023-06-11 DIAGNOSIS — Z955 Presence of coronary angioplasty implant and graft: Secondary | ICD-10-CM

## 2023-06-11 DIAGNOSIS — I2510 Atherosclerotic heart disease of native coronary artery without angina pectoris: Secondary | ICD-10-CM

## 2023-06-11 LAB — CUP PACEART INCLINIC DEVICE CHECK
Battery Remaining Longevity: 158 mo
Battery Voltage: 3.04 V
Brady Statistic AP VP Percent: 5.59 %
Brady Statistic AP VS Percent: 2.6 %
Brady Statistic AS VP Percent: 0.07 %
Brady Statistic AS VS Percent: 91.74 %
Brady Statistic RA Percent Paced: 8.14 %
Brady Statistic RV Percent Paced: 5.67 %
Date Time Interrogation Session: 20250421084246
Implantable Lead Connection Status: 753985
Implantable Lead Connection Status: 753985
Implantable Lead Implant Date: 20230713
Implantable Lead Implant Date: 20230713
Implantable Lead Location: 753859
Implantable Lead Location: 753860
Implantable Lead Model: 3830
Implantable Lead Model: 5076
Implantable Pulse Generator Implant Date: 20230713
Lead Channel Impedance Value: 342 Ohm
Lead Channel Impedance Value: 399 Ohm
Lead Channel Impedance Value: 437 Ohm
Lead Channel Impedance Value: 437 Ohm
Lead Channel Pacing Threshold Amplitude: 0.5 V
Lead Channel Pacing Threshold Amplitude: 0.875 V
Lead Channel Pacing Threshold Pulse Width: 0.4 ms
Lead Channel Pacing Threshold Pulse Width: 0.4 ms
Lead Channel Sensing Intrinsic Amplitude: 12.75 mV
Lead Channel Sensing Intrinsic Amplitude: 14 mV
Lead Channel Sensing Intrinsic Amplitude: 2.375 mV
Lead Channel Sensing Intrinsic Amplitude: 2.625 mV
Lead Channel Setting Pacing Amplitude: 1.5 V
Lead Channel Setting Pacing Amplitude: 2 V
Lead Channel Setting Pacing Pulse Width: 0.4 ms
Lead Channel Setting Sensing Sensitivity: 1.2 mV
Zone Setting Status: 755011

## 2023-06-11 MED ORDER — DILTIAZEM HCL 30 MG PO TABS: 30.0000 mg | ORAL_TABLET | Freq: Four times a day (QID) | ORAL | 3 refills | Status: AC | PRN

## 2023-06-11 NOTE — Patient Instructions (Signed)
 Medication Instructions:  Your physician recommends that you continue on your current medications as directed. Please refer to the Current Medication list given to you today.  *If you need a refill on your cardiac medications before your next appointment, please call your pharmacy*  Lab Work: None ordered If you have labs (blood work) drawn today and your tests are completely normal, you will receive your results only by: MyChart Message (if you have MyChart) OR A paper copy in the mail If you have any lab test that is abnormal or we need to change your treatment, we will call you to review the results.  Follow-Up: At [facility], you and your health needs are our priority.  As part of our continuing mission to provide you with exceptional heart care, our providers are all part of one team.  This team includes your primary Cardiologist (physician) and Advanced Practice Providers or APPs (Physician Assistants and Nurse Practitioners) who all work together to provide you with the care you need, when you need it.  Your next appointment:   3-4 month(s)  Provider:   Agatha Horsfall, MD only     1st Floor: - Lobby - Registration  - Pharmacy  - Lab - Cafe  2nd Floor: - PV Lab - Diagnostic Testing (echo, CT, nuclear med)  3rd Floor: - Vacant  4th Floor: - TCTS (cardiothoracic surgery) - AFib Clinic - Structural Heart Clinic - Vascular Surgery  - Vascular Ultrasound  5th Floor: - HeartCare Cardiology (general and EP) - Clinical Pharmacy for coumadin, hypertension, lipid, weight-loss medications, and med management appointments    Valet parking services will be available as well.

## 2023-06-28 ENCOUNTER — Ambulatory Visit: Admitting: Psychology

## 2023-06-28 DIAGNOSIS — F4321 Adjustment disorder with depressed mood: Secondary | ICD-10-CM | POA: Diagnosis not present

## 2023-06-28 LAB — HEPATIC FUNCTION PANEL
ALT: 37 U/L (ref 10–40)
AST: 37 (ref 14–40)
Alkaline Phosphatase: 80 (ref 25–125)

## 2023-06-28 LAB — LIPID PANEL
Cholesterol: 121 (ref 0–200)
HDL: 49 (ref 35–70)
LDL Cholesterol: 56
Triglycerides: 79 (ref 40–160)

## 2023-06-28 LAB — BASIC METABOLIC PANEL WITH GFR
BUN: 14 (ref 4–21)
Creatinine: 1 (ref 0.6–1.3)

## 2023-06-28 LAB — TSH: TSH: 3.17 (ref 0.41–5.90)

## 2023-06-28 LAB — COMPREHENSIVE METABOLIC PANEL WITH GFR: Calcium: 9.5 (ref 8.7–10.7)

## 2023-06-28 NOTE — Progress Notes (Signed)
 [facility] Counselor/Therapist Progress Note  Patient ID: Brandon Madden, MRN: [id number],    Date: 06/28/2023  Time Spent: 50 mins; start time: 1610; end time: 1700  Treatment Type: Individual Therapy  Reported Symptoms: Pt presents for session in person in the office, granting consent for the session.  Mental Status Exam: Appearance:  Casual     Behavior: Appropriate  Motor: Normal  Speech/Language:  Clear and Coherent  Affect: Appropriate  Mood: normal  Thought process: normal  Thought content:   WNL  Sensory/Perceptual disturbances:   WNL  Orientation: oriented to person, place, and time/date  Attention: Good  Concentration: Good  Memory: WNL  Fund of knowledge:  Good  Insight:   Good  Judgment:  Good  Impulse Control: Good   Risk Assessment: Danger to Self:  No Self-injurious Behavior: No Danger to Others: No Duty to Warn:no Physical Aggression / Violence:No  Access to Firearms a concern: No  Gang Involvement:No   Subjective: Pt shares that "It has been a difficult few weeks.  Work continues to be stressful and I am often wondering how much longer I can do this.  I am also noticing more frequently that Brandon Madden and I have very different spiritual perspectives."  Pt also shares that his son, Brandon Madden, also has similar perspectives as Brandon Madden.  Pt has learned that a good friend of his Brandon Madden) from growing up has a new relationship with a man.  He is not sure how to bring this up to [name] because they view homosexuality as a sin.  Brandon Madden and Brandon Madden also are huge supporters of [name] and that is concerning to pt.  Pt is feeling the stress of these situations and he admits to internalizing the stress.  Pt shares that Brandon Madden does not have any independent friends and that is isolating for her.  Pt shares that [name] and her husband are not coming down for [name] and Brandon Madden is disappointed with that; they hope to come for his birthday a few weeks  later.  Encouraged pt to be as intentional as possible about carving out time for self care between now and our follow up session; we will meet in 4 wks for a follow up session.  Interventions: Cognitive Behavioral Therapy  Diagnosis:Adjustment disorder with depressed mood  Plan: Treatment Plan Strengths/Abilities:  Intelligent, Intuitive, Willing to participate in therapy Treatment Preferences:  Outpatient Individual Therapy Statement of Needs:  Patient is to use CBT, mindfulness and coping skills to help manage and/or decrease symptoms associated with their diagnosis. Symptoms:  Depressed/Irritable mood, worry, social withdrawal Problems Addressed:  Depressive thoughts, Sadness, Sleep issues, etc. Long Term Goals:  Pt to reduce overall level, frequency, and intensity of the feelings of depression as evidenced by decreased irritability, negative self talk, and helpless feelings from 6 to 7 days/week to 0 to 1 days/week, per client report, for at least 3 consecutive months.  Progress: 20% Short Term Goals:  Pt to verbally express understanding of the relationship between feelings of depression and their impact on thinking patterns and behaviors.  Pt to verbalize an understanding of the role that distorted thinking plays in creating fears, excessive worry, and ruminations.  Progress: 20% Target Date:  07/25/2023 Frequency:  Bi-weekly Modality:  Cognitive Behavioral Therapy Interventions by Therapist:  Therapist will use CBT, Mindfulness exercises, Coping skills and Referrals, as needed by client. Client has verbally approved this treatment plan.  Brandon Madden, [facility]

## 2023-07-05 NOTE — Addendum Note (Signed)
 Addended by: Lott Rouleau A on: 07/05/2023 11:02 AM   Modules accepted: Orders

## 2023-07-05 NOTE — Progress Notes (Signed)
 Remote pacemaker transmission.

## 2023-07-26 ENCOUNTER — Ambulatory Visit (INDEPENDENT_AMBULATORY_CARE_PROVIDER_SITE_OTHER): Admitting: Psychology

## 2023-07-26 DIAGNOSIS — F4321 Adjustment disorder with depressed mood: Secondary | ICD-10-CM | POA: Diagnosis not present

## 2023-07-26 NOTE — Progress Notes (Signed)
 [facility] Counselor/Therapist Progress Note  Patient ID: Brandon Madden, MRN: [id number],    Date: 07/26/2023  Time Spent: 50 mins; start time: 1610; end time: 1700  Treatment Type: Individual Therapy  Reported Symptoms: Pt presents for session in person in the office, granting consent for the session.  Mental Status Exam: Appearance:  Casual     Behavior: Appropriate  Motor: Normal  Speech/Language:  Clear and Coherent  Affect: Appropriate  Mood: normal  Thought process: normal  Thought content:   WNL  Sensory/Perceptual disturbances:   WNL  Orientation: oriented to person, place, and time/date  Attention: Good  Concentration: Good  Memory: WNL  Fund of knowledge:  Good  Insight:   Good  Judgment:  Good  Impulse Control: Good   Risk Assessment: Danger to Self:  No Self-injurious Behavior: No Danger to Others: No Duty to Warn:no Physical Aggression / Violence:No  Access to Firearms a concern: No  Gang Involvement:No   Subjective: Pt shares that "Pt shares that he has been able to hear positive messages in my life lately and I appreciate those messages.  Brandon Madden and I took a trip recently and I really enjoyed spending time with him before he goes to [place] in the Fall."  Pt shares they were able to talk about meaningful things while they were together.  Pt is aware that Shelagh Madden has had significant influence in Brandon Madden's life and he is trying to pour into his life when he gets the opportunity.  Brandon Madden has graduated from [employer] and they are celebrating his 19th birthday this weekend.  Pt's daughter, Brandon Madden, is coming for his birthday this weekend.  Pt continues to be give examples of how selfish Shelagh Madden is and does not appreciate what he does for her and the family.  He has been listening to sermons that are supportive messages for him and that has been self care for him, as was his time with  Brandon Madden; he was also binge watching a favorite TV show while Shelagh Madden and the girls  were gone this week.  Pt has been able to spend more time on work this week and that was helpful for him.  He is still not getting much appreciation for his work from his boss.  Encouraged pt to be as intentional as possible about carving out time for self care between now and our follow up session; we will meet in 4 wks for a follow up session.  Interventions: Cognitive Behavioral Therapy  Diagnosis:Adjustment disorder with depressed mood  Plan: Treatment Plan Strengths/Abilities:  Intelligent, Intuitive, Willing to participate in therapy Treatment Preferences:  Outpatient Individual Therapy Statement of Needs:  Patient is to use CBT, mindfulness and coping skills to help manage and/or decrease symptoms associated with their diagnosis. Symptoms:  Depressed/Irritable mood, worry, social withdrawal Problems Addressed:  Depressive thoughts, Sadness, Sleep issues, etc. Long Term Goals:  Pt to reduce overall level, frequency, and intensity of the feelings of depression as evidenced by decreased irritability, negative self talk, and helpless feelings from 6 to 7 days/week to 0 to 1 days/week, per client report, for at least 3 consecutive months.  Progress: 20% Short Term Goals:  Pt to verbally express understanding of the relationship between feelings of depression and their impact on thinking patterns and behaviors.  Pt to verbalize an understanding of the role that distorted thinking plays in creating fears, excessive worry, and ruminations.  Progress: 20% Target Date:  07/24/2024 Frequency:  Bi-weekly Modality:  Cognitive Behavioral Therapy Interventions by  Therapist:  Therapist will use CBT, Mindfulness exercises, Coping skills and Referrals, as needed by client. Client has verbally approved this treatment plan.  Brandon Madden, [facility]

## 2023-08-01 ENCOUNTER — Ambulatory Visit (HOSPITAL_BASED_OUTPATIENT_CLINIC_OR_DEPARTMENT_OTHER): Admitting: Cardiology

## 2023-08-01 ENCOUNTER — Encounter (HOSPITAL_BASED_OUTPATIENT_CLINIC_OR_DEPARTMENT_OTHER): Payer: Self-pay

## 2023-08-01 ENCOUNTER — Encounter (HOSPITAL_BASED_OUTPATIENT_CLINIC_OR_DEPARTMENT_OTHER): Payer: Self-pay | Admitting: Cardiology

## 2023-08-01 VITALS — BP 106/74 | HR 84 | Ht 66.0 in | Wt 209.7 lb

## 2023-08-01 DIAGNOSIS — E66811 Obesity, class 1: Secondary | ICD-10-CM

## 2023-08-01 DIAGNOSIS — I480 Paroxysmal atrial fibrillation: Secondary | ICD-10-CM

## 2023-08-01 DIAGNOSIS — I2510 Atherosclerotic heart disease of native coronary artery without angina pectoris: Secondary | ICD-10-CM | POA: Diagnosis not present

## 2023-08-01 DIAGNOSIS — Z6833 Body mass index (BMI) 33.0-33.9, adult: Secondary | ICD-10-CM

## 2023-08-01 DIAGNOSIS — Z713 Dietary counseling and surveillance: Secondary | ICD-10-CM | POA: Diagnosis not present

## 2023-08-01 DIAGNOSIS — Z950 Presence of cardiac pacemaker: Secondary | ICD-10-CM

## 2023-08-01 DIAGNOSIS — Z7182 Exercise counseling: Secondary | ICD-10-CM

## 2023-08-01 DIAGNOSIS — E6609 Other obesity due to excess calories: Secondary | ICD-10-CM

## 2023-08-01 DIAGNOSIS — E8881 Metabolic syndrome: Secondary | ICD-10-CM | POA: Diagnosis not present

## 2023-08-01 NOTE — Patient Instructions (Signed)
 Medication Instructions:  Your physician recommends that you continue on your current medications as directed. Please refer to the Current Medication list given to you today.   Follow-Up: Please follow up in 6 months with Dr. Veryl Gottron, Slater Duncan, NP or Neomi Banks, NP

## 2023-08-01 NOTE — Progress Notes (Signed)
 Cardiology Office Note:  .   Date:  08/01/2023  ID:  Brandon Madden, DOB [birth year], MRN [id number] PCP: [place], Betty G, MD  [facility] Providers Cardiologist:  Sheryle Donning, MD Electrophysiologist:  Will Cortland Ding, MD {  History of Present Illness: .   Brandon Madden is a 63 y.o. male with PMH CAD, hyperlipidemia, paroxysmal SVT and afib, bradycardia s/p pacemaker. He was previously seen by Dr. Ardell Beauvais and established care with me on 02/09/23. He is interested in lifestyle management of metabolic syndrome.  Today: Seen by EP 06/11/23 for SVT noted on device. Declined antiarrhythmics or ablation at this time.  Has lab results with him today from 06/28/23: Tchol 121, TG 79, HDL 49, LDL 56.  This is on his current diet plan: egg bites in the AM, lunch on a good day is a salad, lunch on a bad day is no lunch or a few KIND bars, or a chicken salad sandwich. Dinner is later in the evening, eats meat (mostly chicken, some beef), potatoes. Or if he is eating out, eats salmon, avoids the starch and tries to get vegetables instead. Tries to avoid bread, baked goods. Rare snacks, maybe three times/week.   He is frustrated as despite this, he is not losing weight. Still under stress, which he feels is biggest contributor. Tries to take breaks, walks outside twice a day. No routine weight bearing exercise but when he works outside, he does heavy lifting (mulch, etc) and high exertion.   We discussed GLP1RA, he tried Wegovy  in the past (around the time of availability issues), declines re-trial of medical management. We also discussed stress management, and he has techniques that he feels like are successful in managing stress. Work is his main source of stress at this time.  ROS: Denies shortness of breath at rest or with normal exertion. No PND, orthopnea, LE edema or unexpected weight gain. No syncope. ROS otherwise negative except as noted.   Studies Reviewed: Aaron Aas    EKG:        Physical Exam:   VS:  BP 106/74   Pulse 84   Ht 5' 6 (1.676 m)   Wt 209 lb 11.2 oz (95.1 kg)   SpO2 95%   BMI 33.85 kg/m    Wt Readings from Last 3 Encounters:  08/01/23 209 lb 11.2 oz (95.1 kg)  06/11/23 210 lb 6.4 oz (95.4 kg)  04/10/23 210 lb (95.3 kg)    GEN: Well nourished, well developed in no acute distress HEENT: Normal, moist mucous membranes NECK: No JVD CARDIAC: regular rhythm, normal S1 and S2, no rubs or gallops. No murmur. VASCULAR: Radial and DP pulses 2+ bilaterally. No carotid bruits RESPIRATORY:  Clear to auscultation without rales, wheezing or rhonchi  ABDOMEN: Soft, non-tender, non-distended MUSCULOSKELETAL:  Ambulates independently SKIN: Warm and dry, no edema NEUROLOGIC:  Alert and oriented x 3. No focal neuro deficits noted. PSYCHIATRIC:  Normal affect      ASSESSMENT AND PLAN: .    CAD s/p PCI to LAD 2023 Nonobstructive carotid plaque -initial Ca score 166 in 2022 -coronary CT 06/2021 with Ca score 260, CAD in mid LAD that was severe (70-99%) with FFR positive at 0.60.  -cath 06/2021 with 95% mid LAD lesion s/p 18 x 2.75 mm DES -myoview  02/2021 without ischemia -continue clopidogrel  75 mg daily -continue rosuvastatin  40 mg daily, ezetimibe  10 mg daily  Metabolic syndrome, with CAD, obesity, hepatic steatosis -focused conversation today on strategies. Discussed research on set point theory,  incretins, limitations for management -we have discussed extensively microbiome, research and limitations -discussed role for resistance training in addition to cardiovascular exercise -overall recommend whole food/Mediterranean based strategy as much as possible with increase in activity as able, especially muscle building activities -he declines medical therapies such as GLP at this time -discussed residual inflammation, hsCRP, and limited data for colchicine in certain populations of patients -he is concerned re: stress, hormones (such as cortisol) are  contributing to his symptoms. He has asked for referral to endocrinology to evaluate for this. I have requested Dr. Monte Antonio due to patient's focus on lifestyle management of symptoms/risk, referral placed  Labs: 02/2023: A1c 6.1, LDL 58, TG 84 06/28/23: Tchol 121, TG 79, HDL 49, LDL 56  Palpitations, syncope Paroxysmal atrial fibrillation -followed by Dr. Lawana Pray. Chadsvasc=1. Has PRN diltiazem  -s/p DC-PPM after pauses seen on ILR. We have discussed his concerns re: vasovagal symptoms -recently seen by EP team, declines antiarrhythmics and ablation at this time  OSA -continue CPAP  Dispo: 6 mos  Total time of encounter: I spent 56 minutes dedicated to the care of this patient on the date of this encounter to include pre-visit review of records, face-to-face time with the patient discussing conditions above, and clinical documentation with the electronic health record. We specifically spent time today discussing topics above, including exercise/diet counseling, research/data around obesity and metabolic syndrome, discussion of available medical therapies  Signed, Sheryle Donning, MD   Sheryle Donning, MD, PhD, [facility]  [facility]  [place]  [facility] V[facility] at [facility] at [facility] [address] [place], [place] [address] [phone]

## 2023-08-28 ENCOUNTER — Ambulatory Visit: Admitting: Psychology

## 2023-08-31 ENCOUNTER — Ambulatory Visit (INDEPENDENT_AMBULATORY_CARE_PROVIDER_SITE_OTHER): Payer: BC Managed Care – PPO

## 2023-08-31 DIAGNOSIS — I442 Atrioventricular block, complete: Secondary | ICD-10-CM

## 2023-09-03 LAB — CUP PACEART REMOTE DEVICE CHECK
Battery Remaining Longevity: 155 mo
Battery Voltage: 3.04 V
Brady Statistic AP VP Percent: 5.42 %
Brady Statistic AP VS Percent: 2.49 %
Brady Statistic AS VP Percent: 0.09 %
Brady Statistic AS VS Percent: 92 %
Brady Statistic RA Percent Paced: 7.88 %
Brady Statistic RV Percent Paced: 5.51 %
Date Time Interrogation Session: 20250711082837
Implantable Lead Connection Status: 753985
Implantable Lead Connection Status: 753985
Implantable Lead Implant Date: 20230713
Implantable Lead Implant Date: 20230713
Implantable Lead Location: 753859
Implantable Lead Location: 753860
Implantable Lead Model: 3830
Implantable Lead Model: 5076
Implantable Pulse Generator Implant Date: 20230713
Lead Channel Impedance Value: 342 Ohm
Lead Channel Impedance Value: 380 Ohm
Lead Channel Impedance Value: 456 Ohm
Lead Channel Impedance Value: 475 Ohm
Lead Channel Pacing Threshold Amplitude: 0.5 V
Lead Channel Pacing Threshold Amplitude: 0.875 V
Lead Channel Pacing Threshold Pulse Width: 0.4 ms
Lead Channel Pacing Threshold Pulse Width: 0.4 ms
Lead Channel Sensing Intrinsic Amplitude: 12 mV
Lead Channel Sensing Intrinsic Amplitude: 12 mV
Lead Channel Sensing Intrinsic Amplitude: 2.75 mV
Lead Channel Sensing Intrinsic Amplitude: 2.75 mV
Lead Channel Setting Pacing Amplitude: 1.5 V
Lead Channel Setting Pacing Amplitude: 2 V
Lead Channel Setting Pacing Pulse Width: 0.4 ms
Lead Channel Setting Sensing Sensitivity: 1.2 mV
Zone Setting Status: 755011

## 2023-09-04 ENCOUNTER — Other Ambulatory Visit: Payer: Self-pay

## 2023-09-04 ENCOUNTER — Ambulatory Visit: Payer: Self-pay | Admitting: Cardiology

## 2023-09-04 MED ORDER — ROSUVASTATIN CALCIUM 40 MG PO TABS: 40.0000 mg | ORAL_TABLET | Freq: Every day | ORAL | 3 refills | Status: DC

## 2023-09-20 ENCOUNTER — Ambulatory Visit: Attending: Cardiology | Admitting: Cardiology

## 2023-09-20 ENCOUNTER — Encounter: Payer: Self-pay | Admitting: Cardiology

## 2023-09-20 ENCOUNTER — Ambulatory Visit: Admitting: Psychology

## 2023-09-20 VITALS — BP 122/80 | HR 82 | Ht 66.0 in | Wt 200.0 lb

## 2023-09-20 DIAGNOSIS — F4321 Adjustment disorder with depressed mood: Secondary | ICD-10-CM

## 2023-09-20 DIAGNOSIS — I471 Supraventricular tachycardia, unspecified: Secondary | ICD-10-CM | POA: Diagnosis not present

## 2023-09-20 DIAGNOSIS — I442 Atrioventricular block, complete: Secondary | ICD-10-CM

## 2023-09-20 DIAGNOSIS — I480 Paroxysmal atrial fibrillation: Secondary | ICD-10-CM | POA: Diagnosis not present

## 2023-09-20 DIAGNOSIS — R001 Bradycardia, unspecified: Secondary | ICD-10-CM | POA: Diagnosis not present

## 2023-09-20 DIAGNOSIS — I25118 Atherosclerotic heart disease of native coronary artery with other forms of angina pectoris: Secondary | ICD-10-CM

## 2023-09-20 DIAGNOSIS — R079 Chest pain, unspecified: Secondary | ICD-10-CM

## 2023-09-20 NOTE — Progress Notes (Signed)
 Electrophysiology Office Note:   Date:  09/20/2023  ID:  Brandon Madden, DOB Mar 26, 1960, MRN [id number]  Primary Cardiologist: Shelda Bruckner, MD Primary Heart Failure: None Electrophysiologist: Corrion Stirewalt Gladis Norton, MD      History of Present Illness:   Brandon Madden is a 63 y.o. male with h/o vasovagal syncope, atrial fibrillation, SVT, coronary artery disease seen today for routine electrophysiology followup.   Since last being seen in our clinic the patient reports continued symptoms of unease.  He says that he is quite stressed.  He does get some chest discomfort in the center of his chest that is milder but somewhat similar to when he had his stent placed.  He has continued to have significant fatigue and weakness.  He is seeing an endocrinologist tomorrow.  He would like to lose some weight.  He has exercising, but is significantly fatigued throughout the day.  He is quite concerned over his episodes of syncope and his multiple rate drop events.  he denies chest pain, palpitations, dyspnea, PND, orthopnea, nausea, vomiting, dizziness, syncope, edema, weight gain, or early satiety.   Review of systems complete and found to be negative unless listed in HPI.      EP Information / Studies Reviewed:    EKG is ordered today. Personal review as below.  EKG Interpretation Date/Time:  Thursday September 20 2023 08:18:56 EDT Ventricular Rate:  82 PR Interval:  174 QRS Duration:  74 QT Interval:  344 QTC Calculation: 401 R Axis:   41  Text Interpretation: Sinus rhythm with Premature atrial complexes When compared with ECG of 11-Jun-2023 08:08, No significant change was found Confirmed by Sopheap Basic ([id number]) on 09/20/2023 8:23:28 AM   PPM Interrogation-  reviewed in detail today,  See PACEART report.  Device History: Medtronic Dual Chamber PPM implanted 08/2021 for Symptomatic bradycardia  Risk Assessment/Calculations:    CHA2DS2-VASc Score = 1   This indicates a 0.6% annual risk of  stroke. The patient's score is based upon:        Physical Exam:   VS:  BP 122/80 (BP Location: Right Arm, Patient Position: Sitting, Cuff Size: Large)   Pulse 82   Ht 5' 6 (1.676 m)   Wt 200 lb (90.7 kg)   SpO2 94%   BMI 32.28 kg/m    Wt Readings from Last 3 Encounters:  09/20/23 200 lb (90.7 kg)  08/01/23 209 lb 11.2 oz (95.1 kg)  06/11/23 210 lb 6.4 oz (95.4 kg)     GEN: Well nourished, well developed in no acute distress NECK: No JVD; No carotid bruits CARDIAC: Regular rate and rhythm, no murmurs, rubs, gallops RESPIRATORY:  Clear to auscultation without rales, wheezing or rhonchi  ABDOMEN: Soft, non-tender, non-distended EXTREMITIES:  No edema; No deformity   ASSESSMENT AND PLAN:    Symptomatic bradycardia with vasovagal syncope s/p Medtronic PPM  Normal PPM function See Pace Art report Sensing, threshold, impedance within normal limits Programming appropriate No changes today Has follow-up with endocrinology which Sundai Probert hopefully shed light on his fatigue.  Less likely to be a chronic arrhythmia issue as his arrhythmia burden is quite low on device interrogation.  2.  Paroxysmal atrial fibrillation: Asymptomatic episodes.  Not anticoagulated due to low stroke risk.  3.  SVT: Minimally symptomatic.  Prefers to avoid medications at this time.  4.  Coronary artery disease: Has LAD stent.  No signs or symptoms of ischemia.  He is having chest discomfort.  He has had not had a coronary evaluation in  2 years.  Lorri Fukuhara plan for cardiac PET scan.  Case discussed with primary cardiology  Disposition:   Follow up with EP APP in 12 months  Signed, Harvis Mabus Gladis Norton, MD

## 2023-09-20 NOTE — Patient Instructions (Signed)
 Medication Instructions:  Your physician recommends that you continue on your current medications as directed. Please refer to the Current Medication list given to you today.  *If you need a refill on your cardiac medications before your next appointment, please call your pharmacy*  Lab Work: None ordered  If you have any lab test that is abnormal or we need to change your treatment, we will call you to review the results.  Testing/Procedures: Your physician recommends you have a cardiac PET scan.     Please report to Radiology at the [facility] Main Entrance 30 minutes early for your test.  [address] [place], [place] [address]                         OR   Please report to Radiology at [facility] Main Entrance, medical mall, 30 mins prior to your test.  [address]  [place], [place]  How to Prepare for Your Cardiac PET/CT Stress Test:  Nothing to eat or drink, except water, 3 hours prior to arrival time.  NO caffeine/decaffeinated products, or chocolate 12 hours prior to arrival. (Please note decaffeinated beverages (teas/coffees) still contain caffeine).  If you have caffeine within 12 hours prior, the test will need to be rescheduled.  Medication instructions: Do not take erectile dysfunction medications for 72 hours prior to test (sildenafil, tadalafil) Do not take nitrates (isosorbide mononitrate, Ranexa) the day before or day of test Do not take tamsulosin the day before or morning of test Hold theophylline containing medications for 12 hours. Hold Dipyridamole 48 hours prior to the test.  Diabetic Preparation: If able to eat breakfast prior to 3 hour fasting, you may take all medications, including your insulin . Do not worry if you miss your breakfast dose of insulin  - start at your next meal. If you do not eat prior to 3 hour fast-Hold all diabetes (oral and insulin ) medications. Patients who wear a continuous glucose  monitor MUST remove the device prior to scanning.  You may take your remaining medications with water.  NO perfume, cologne or lotion on chest or abdomen area. FEMALES - Please avoid wearing dresses to this appointment.  Total time is 1 to 2 hours; you may want to bring reading material for the waiting time.  IF YOU THINK YOU MAY BE PREGNANT, OR ARE NURSING PLEASE INFORM THE TECHNOLOGIST.  In preparation for your appointment, medication and supplies will be purchased.  Appointment availability is limited, so if you need to cancel or reschedule, please call the Radiology Department Scheduler at [phone] 24 hours in advance to avoid a cancellation fee of $100.00  What to Expect When you Arrive:  Once you arrive and check in for your appointment, you will be taken to a preparation room within the Radiology Department.  A technologist or Nurse will obtain your medical history, verify that you are correctly prepped for the exam, and explain the procedure.  Afterwards, an IV will be started in your arm and electrodes will be placed on your skin for EKG monitoring during the stress portion of the exam. Then you will be escorted to the PET/CT scanner.  There, staff will get you positioned on the scanner and obtain a blood pressure and EKG.  During the exam, you will continue to be connected to the EKG and blood pressure machines.  A small, safe amount of a radioactive tracer will be injected in your IV to  obtain a series of pictures of your heart along with an injection of a stress agent.    After your Exam:  It is recommended that you eat a meal and drink a caffeinated beverage to counter act any effects of the stress agent.  Drink plenty of fluids for the remainder of the day and urinate frequently for the first couple of hours after the exam.  Your doctor will inform you of your test results within 7-10 business days.  For more information and frequently asked questions, please visit our  website: [email or web address]  For questions about your test or how to prepare for your test, please call: Cardiac Imaging Nurse Navigators Office: [phone]   Follow-Up: At [facility], you and your health needs are our priority.  As part of our continuing mission to provide you with exceptional heart care, our providers are all part of one team.  This team includes your primary Cardiologist (physician) and Advanced Practice Providers or APPs (Physician Assistants and Nurse Practitioners) who all work together to provide you with the care you need, when you need it.  Your next appointment:   1 year(s)  Provider:   You will see one of the following Advanced Practice Providers on your designated Care Team:   Charlies Arthur, PA-C Michael Andy Tillery, PA-C Daphne Barrack, NP   Thank you for choosing [facility]!!   Maeola Domino, RN [phone]   Other Instructions

## 2023-09-20 NOTE — Progress Notes (Signed)
 [facility] Counselor/Therapist Progress Note  Patient ID: Brandon Madden, MRN: [id number],    Date: 09/20/2023  Time Spent: 50 mins; start time: 1611; end time: 1701  Treatment Type: Individual Therapy  Reported Symptoms: Pt presents for session in person in the office, granting consent for the session.  Mental Status Exam: Appearance:  Casual     Behavior: Appropriate  Motor: Normal  Speech/Language:  Clear and Coherent  Affect: Appropriate  Mood: normal  Thought process: normal  Thought content:   WNL  Sensory/Perceptual disturbances:   WNL  Orientation: oriented to person, place, and time/date  Attention: Good  Concentration: Good  Memory: WNL  Fund of knowledge:  Good  Insight:   Good  Judgment:  Good  Impulse Control: Good   Risk Assessment: Danger to Self:  No Self-injurious Behavior: No Danger to Others: No Duty to Warn:no Physical Aggression / Violence:No  Access to Firearms a concern: No  Gang Involvement:No   Subjective: Pt shares that there is a lot to share.  I have had feels of gloom sadness; we went to [place] as a family and we met with several of my friends for childhood.  It helped to reset me; it made me realize what is important and what I need to focus on more.  Pt shares that he needs to be more intentional about connecting with his [place] friends more regularly.  He shares the dynamic with Hadassah continues to be difficult and I pray about this more than anything else in my life.  Pt shares that he does not trust her in terms of her political beliefs and how it is impacting Leonce; he is buying into the current political climate.  Pt met with his cardiologist this morning and it is not a positive mtg.  He told me that he has a condition and the pacemaker is the treatment for it.  They do not have a means to correct the underlying issue.  This response was troubling for me.  Pt has an appt with an endocrinologist tomorrow to talk about  weight loss issues. Pt also wants a second opinion about his neurological issue that effects his heart rate.  Pt was happy with a Bible study that he led on Sunday; I had no idea it would go as well as it did.  Encouraged pt to continue to pray about his issues with Hadassah and his cardiac concerns and to expect God to direct his steps.   Encouraged pt to be as intentional as possible about carving out time for self care between now and our follow up session; we will meet in 4 wks for a follow up session.  Interventions: Cognitive Behavioral Therapy  Diagnosis:Adjustment disorder with depressed mood  Plan: Treatment Plan Strengths/Abilities:  Intelligent, Intuitive, Willing to participate in therapy Treatment Preferences:  Outpatient Individual Therapy Statement of Needs:  Patient is to use CBT, mindfulness and coping skills to help manage and/or decrease symptoms associated with their diagnosis. Symptoms:  Depressed/Irritable mood, worry, social withdrawal Problems Addressed:  Depressive thoughts, Sadness, Sleep issues, etc. Long Term Goals:  Pt to reduce overall level, frequency, and intensity of the feelings of depression as evidenced by decreased irritability, negative self talk, and helpless feelings from 6 to 7 days/week to 0 to 1 days/week, per client report, for at least 3 consecutive months.  Progress: 20% Short Term Goals:  Pt to verbally express understanding of the relationship between feelings of depression and their impact on thinking patterns and  behaviors.  Pt to verbalize an understanding of the role that distorted thinking plays in creating fears, excessive worry, and ruminations.  Progress: 20% Target Date:  07/24/2024 Frequency:  Bi-weekly Modality:  Cognitive Behavioral Therapy Interventions by Therapist:  Therapist will use CBT, Mindfulness exercises, Coping skills and Referrals, as needed by client. Client has verbally approved this treatment plan.  Francis KATHEE Macintosh, [facility]

## 2023-09-21 ENCOUNTER — Encounter: Payer: Self-pay | Admitting: "Endocrinology

## 2023-09-21 ENCOUNTER — Ambulatory Visit (INDEPENDENT_AMBULATORY_CARE_PROVIDER_SITE_OTHER): Admitting: "Endocrinology

## 2023-09-21 VITALS — BP 124/72 | HR 60 | Ht 66.0 in | Wt 204.2 lb

## 2023-09-21 DIAGNOSIS — E782 Mixed hyperlipidemia: Secondary | ICD-10-CM

## 2023-09-21 DIAGNOSIS — E8881 Metabolic syndrome: Secondary | ICD-10-CM

## 2023-09-21 DIAGNOSIS — E161 Other hypoglycemia: Secondary | ICD-10-CM | POA: Diagnosis not present

## 2023-09-21 DIAGNOSIS — K760 Fatty (change of) liver, not elsewhere classified: Secondary | ICD-10-CM

## 2023-09-21 DIAGNOSIS — R7303 Prediabetes: Secondary | ICD-10-CM

## 2023-09-21 LAB — POCT GLYCOSYLATED HEMOGLOBIN (HGB A1C): HbA1c, POC (prediabetic range): 5.9 % (ref 5.7–6.4)

## 2023-09-21 NOTE — Progress Notes (Signed)
 Endocrinology Consult Note                                            09/21/2023, 1:07 PM Consult for metabolic syndrome  Subjective:    Patient ID: Brandon Madden, male    DOB: 1960-08-08, PCP [place], Betty G, MD   Past Medical History:  Diagnosis Date   CAD (coronary artery disease) 07/13/2021   CCTA 06/2021: CAC score 260 (84th percentile); mLAD 70-99; mLAD FFR pos (0.60) Status post DES to the mid LAD 06/2021   Carotid stenosis 08/11/2021   Carotid US  07/2021: Bilat ICA 1-39   Deafness in right ear    Family history of pancreatic cancer    Family history of prostate cancer    Family history of skin cancer    Fatty liver    High cholesterol    Hyperlipidemia LDL goal <70 08/20/2018   PAF (paroxysmal atrial fibrillation) (HCC) 05/06/2021   Pre-diabetes    Sleep apnea    Vitamin B 12 deficiency    Vitamin D  deficiency    Past Surgical History:  Procedure Laterality Date   CARDIAC CATHETERIZATION     CORONARY STENT INTERVENTION N/A 07/15/2021   Procedure: CORONARY STENT INTERVENTION;  Surgeon: Claudene Victory ORN, MD;  Location: [facility] INVASIVE CV LAB;  Service: Cardiovascular;  Laterality: N/A;   CORONARY ULTRASOUND/IVUS N/A 07/15/2021   Procedure: Intravascular Ultrasound/IVUS;  Surgeon: Claudene Victory ORN, MD;  Location[facility] INVASIVE CV LAB;  Service: Cardiovascular;  Laterality: N/A;   INSERT / REPLACE / REMOVE PACEMAKER     LEFT HEART CATH AND CORONARY ANGIOGRAPHY N/A 07/15/2021   Procedure: LEFT HEART CATH AND CORONARY ANGIOGRAPHY;  Surgeon: Claudene Victory ORN, MD;  Location: [facility] INVASIVE CV LAB;  Service: Cardiovascular;  Laterality: N/A;   LOOP RECORDER REMOVAL N/A 09/01/2021   Procedure: LOOP RECORDER REMOVAL;  Surgeon: Inocencio Soyla Lunger, MD;  Location: [facility] INVASIVE CV LAB;  Service: Cardiovascular;  Laterality: N/A;   NASAL SEPTUM SURGERY     PACEMAKER IMPLANT N/A 09/01/2021   Procedure: PACEMAKER IMPLANT;  Surgeon: Inocencio Soyla Lunger, MD;  Location: [facility] INVASIVE CV LAB;   Service: Cardiovascular;  Laterality: N/A;   Social History   Socioeconomic History   Marital status: Married    Spouse name: Hadassah   Number of children: 4   Years of education: Law   Highest education level: Professional school degree (e.g., MD, DDS, DVM, JD)  Occupational History   Occupation: [employer]    Comment: [employer]  Tobacco Use   Smoking status: Never   Smokeless tobacco: Never  Vaping Use   Vaping status: Never Used  Substance and Sexual Activity   Alcohol use: No    Comment: quit: 2011   Drug use: No   Sexual activity: Yes  Other Topics Concern   Not on file  Social History Narrative   Right handed   Patient lives at home with his family.   Caffeine Use: 2 cups daily   Social Drivers of Health   Financial Resource Strain: Not on file  Food Insecurity: Not on file  Transportation Needs: Not on file  Physical Activity: Not on file  Stress: Not on file (12/27/2022)  Social Connections: Not on file   Family History  Problem Relation Age of Onset   High Cholesterol Mother    Heart attack Father  High Cholesterol Father    High blood pressure Father    Sudden death Father    Skin cancer Sister    Prostate cancer Brother        dx 49   Prostate cancer Brother        dx 28, metastatic, d 65   Pancreatic cancer Maternal Grandmother        dx 27s, d 2s   Outpatient Encounter Medications as of 09/21/2023  Medication Sig   Lactobacillus (PROBIOTIC ACIDOPHILUS PO) Take 1 tablet by mouth 2 (two) times daily.   acetaminophen  (TYLENOL ) 500 MG tablet Take 500-1,000 mg by mouth every 6 (six) hours as needed (pain.).   aspirin -acetaminophen -caffeine (EXCEDRIN MIGRAINE) 250-250-65 MG tablet Take 1-2 tablets by mouth 2 (two) times daily as needed for headache.   clopidogrel  (PLAVIX ) 75 MG tablet Take 1 tablet (75 mg total) by mouth daily.   diltiazem  (CARDIZEM ) 30 MG tablet Take 1 tablet (30 mg total) by mouth 4 (four) times daily as needed.   ezetimibe  (ZETIA ) 10  MG tablet TAKE 1 TABLET BY MOUTH EVERY DAY   ibuprofen (ADVIL) 200 MG tablet Take 400-600 mg by mouth every 8 (eight) hours as needed (pain.).   Multiple Vitamins-Minerals (MULTI FOR HIM) TABS Take 1 tablet by mouth in the morning.   nitroGLYCERIN  (NITROSTAT ) 0.4 MG SL tablet Place 1 tablet (0.4 mg total) under the tongue every 5 (five) minutes as needed for chest pain.   Polyethyl Glycol-Propyl Glycol (LUBRICANT EYE DROPS) 0.4-0.3 % SOLN Place 1-2 drops into both eyes 3 (three) times daily as needed (dry/irritated eyes.).   rosuvastatin  (CRESTOR ) 40 MG tablet Take 1 tablet (40 mg total) by mouth daily.   No facility-administered encounter medications on file as of 09/21/2023.   ALLERGIES: Allergies  Allergen Reactions   Bee Venom Anaphylaxis    VACCINATION STATUS: Immunization History  Administered Date(s) Administered   Influenza, Seasonal, Injecte, Preservative Fre 03/12/2023   Influenza,inj,Quad PF,6+ Mos 12/15/2018, 11/15/2021   Influenza-Unspecified 11/20/2020   PFIZER(Purple Top)SARS-COV-2 Vaccination 05/27/2019, 06/24/2019, 05/21/2020   Tdap 12/18/2008, 03/30/2020   Zoster Recombinant(Shingrix) 03/30/2020, 05/25/2020    HPI Brandon Madden is 63 y.o. male who presents today with a medical history as above. he is being seen in consultation for metabolic syndrome requested by [place], Betty G, MD.  History is obtained directly from the patient as well as chart review. Brandon Madden has multiple medical concerns including coronary artery disease involving the left anterior descending artery status post stent placement in May 2023, hyperlipidemia, atrial fibrillation status post pacemaker placement, prediabetes, obstructive sleep apnea, metabolic dysfunction associated steatotic liver disease (MASLD), hyperinsulinemia, obesity. - He keeps regular, determined follow-up with his cardiology team in [place]. He has been dealing with poor health for the last several weeks with intermittent  syncope/presyncope without a clear cardiac etiology.  He states he was told he may benefit from optimizing his metabolic health.   He is not on any particular diet and exercise program at this time.  He reports modest amount of stress related to his work.  He also reports poor sleep quality, despite CPAP machine. He was never diagnosed with diabetes, however his records show hyperinsulinemia and prediabetes at least since 2014.  A year or so ago, he was given a trial of Ozempic which he did not tolerate. He wishes to achieve metabolic health without pharmaceuticals or surgery as much as possible.  He also wants to feel better in general as he is dealing with chronic  fatigue. His point-of-care A1c today was 5.9%. He is a father of 4 grown children.  He reports significant family history of coronary artery disease in his father, himself diagnosed with coronary artery disease in his late 22s. Carotid Doppler done in 2023 showed minor plaque in  both of his carotid arteries, did not have subsequent imaging.  He is concerned about his intermittent syncope/presyncope which are happening rather randomly. He underwent echocardiogram in 2022 which showed ejection fraction of 55-60%. He had brain MRI in May 2022 which showed normal findings. He is not a smoker, used EtOH in the past. He reports fluctuating body weight over the years, currently weighs not too far from his maximum weight at 204 pounds, giving him a BMI of 32.96 kg/m. His current medications include Crestor  40 mg p.o. nightly, nitroglycerin  as needed, ezetimibe  10 mg p.o. daily, Cardizem  30 mg 4 times daily, Plavix  75 mg daily, lactobacillus probiotic, multivitamins, and Tylenol  as needed.   Review of Systems  Constitutional: +mildly fluctuating body weight , + fatigue, no subjective hyperthermia, no subjective hypothermia Eyes: no blurry vision, no xerophthalmia ENT: no sore throat, no nodules palpated in throat, no dysphagia/odynophagia,  no hoarseness Cardiovascular: no Chest Pain, no Shortness of Breath, no palpitations, no leg swelling Respiratory: no cough, no shortness of breath Gastrointestinal: no Nausea/Vomiting/Diarhhea Musculoskeletal: no muscle/joint aches Skin: no rashes Neurological: no tremors, no numbness, no tingling, no dizziness Psychiatric: no depression, no anxiety  Objective:       09/21/2023    9:20 AM 09/20/2023    8:10 AM 08/01/2023    8:22 AM  Vitals with BMI  Height 5' 6 5' 6 5' 6  Weight 204 lbs 3 oz 200 lbs 209 lbs 11 oz  BMI 32.97 32.3 33.86  Systolic 124 122 893  Diastolic 72 80 74  Pulse 60 82 84    BP 124/72   Pulse 60   Ht 5' 6 (1.676 m)   Wt 204 lb 3.2 oz (92.6 kg)   BMI 32.96 kg/m   Wt Readings from Last 3 Encounters:  09/21/23 204 lb 3.2 oz (92.6 kg)  09/20/23 200 lb (90.7 kg)  08/01/23 209 lb 11.2 oz (95.1 kg)    Physical Exam  Constitutional:  Body mass index is 32.96 kg/m.,  not in acute distress, normal state of mind Eyes: PERRLA, EOMI, no exophthalmos ENT: moist mucous membranes, no gross thyromegaly, no gross cervical lymphadenopathy Cardiovascular: normal precordial activity, Regular Rate and Rhythm, no Murmur/Rubs/Gallops Respiratory:  adequate breathing efforts, no gross chest deformity, Clear to auscultation bilaterally Gastrointestinal:  + Obese, abdomen soft, Non -tender, No distension, Bowel Sounds present, no gross organomegaly Musculoskeletal: no gross deformities, strength intact in all four extremities, no peripheral edema Skin: moist, warm, no rashes Neurological: no tremor with outstretched hands, Deep tendon reflexes normal in bilateral lower extremities.  CMP ( most recent) CMP     Component Value Date/Time   NA 142 03/12/2023 0751   NA 142 03/20/2022 1011   K 4.4 03/12/2023 0751   CL 106 03/12/2023 0751   CO2 25 03/12/2023 0751   GLUCOSE 107 (H) 03/12/2023 0751   BUN 16 03/12/2023 0751   BUN 16 03/20/2022 1011   CREATININE 0.95  03/12/2023 0751   CALCIUM  9.4 03/12/2023 0751   PROT 7.1 03/12/2023 0751   PROT 6.7 04/06/2022 0737   ALBUMIN 4.6 03/12/2023 0751   ALBUMIN 4.4 04/06/2022 0737   AST 30 03/12/2023 0751   ALT 31 03/12/2023 0751  ALKPHOS 75 03/12/2023 0751   BILITOT 0.5 03/12/2023 0751   BILITOT 0.3 04/06/2022 0737   GFR 86.44 03/12/2023 0751   EGFR 96 03/20/2022 1011   GFRNONAA 93 11/11/2012 1000     Diabetic Labs (most recent): Lab Results  Component Value Date   HGBA1C 5.9 09/21/2023   HGBA1C 6.1 03/12/2023   HGBA1C 5.4 02/27/2022     Lipid Panel ( most recent) Lipid Panel     Component Value Date/Time   CHOL 125 03/12/2023 0751   CHOL 116 01/03/2022 0842   TRIG 84.0 03/12/2023 0751   HDL 50.40 03/12/2023 0751   HDL 51 01/03/2022 0842   CHOLHDL 2 03/12/2023 0751   VLDL 16.8 03/12/2023 0751   LDLCALC 58 03/12/2023 0751   LDLCALC 52 01/03/2022 0842   LABVLDL 13 01/03/2022 0842      Lab Results  Component Value Date   TSH 2.31 03/12/2023   TSH 1.550 03/20/2022   TSH 1.64 03/30/2020   TSH 1.89 12/18/2018   TSH 1.750 11/11/2012   FREET4 1.12 03/20/2022         Latest Reference Range & Units 03/20/22 10:11 03/12/23 07:51 09/21/23 11:56  Cortisol Free, Ser mcg/dL  9.61   Glucose 70 - 99 mg/dL 889 (H) 892 (H)   Hemoglobin A1C 4.6 - 6.5 %  6.1   HbA1c, POC (prediabetic range) 5.7 - 6.4 %   5.9  INSULIN  2.6 - 24.9 uIU/mL 25.9 (H)    TSH 0.35 - 5.50 uIU/mL 1.550 2.31   Triiodothyronine,Free,Serum 2.0 - 4.4 pg/mL 3.2    T4,Free(Direct) 0.82 - 1.77 ng/dL 8.87    PSA 9.89 - 5.99 ng/mL  0.96     Assessment & Plan:   1. Metabolic syndrome (Primary) 2. Prediabetes 3. Mixed hyperlipidemia 4. Hyperinsulinemia 5.  Metabolic dysfunction associated steatotic liver disease  - Brandon Madden  is a pleasant patient being seen at a kind request of [place], Dickey MATSU, MD. - I have reviewed his  new and existing /available  records and clinically evaluated the patient. - Based on these  reviews, he has multiple mild to moderate metabolic dysfunction manifestations which will make him a candidate to benefit from optimized metabolic health as detailed below.   I encouraged him to keep close follow-up with his cardiology and other providers while we work on his lifestyle choices.  From the point of view of all of the above concerns including prediabetes, MASLD, hyperlipidemia, hyperinsulinemia, coronary artery disease , obstructive sleep apnea combined with his desire to avoid pharmaceutical and surgical approaches, he is an immediate candidate for lifestyle medicine. He will benefit the most from weight loss and he can afford losing up to 20% of his body weight.  Options were given to him including lifestyle medicine, pharmaceutical intervention with GLP-1 receptor agonist, or surgical weight management.  He opted for lifestyle medicine for now and it is reasonable. - he acknowledges that there is a room for improvement in his food and drink choices. - Suggestion is made for him to avoid simple carbohydrates  from his diet including Cakes, Sweet Desserts, Ice Cream, Soda (diet and regular), Sweet Tea, Candies, Chips, Cookies, Store Bought Juices, Alcohol , Artificial Sweeteners,  Coffee Creamer, and Sugar-free Products, Lemonade. This will help patient to  potentially avoid unintended weight gain.  The following Lifestyle Medicine recommendations according to American College of Lifestyle Medicine  [facility]) were discussed and and offered to patient and he  agrees to start the journey:  A.  Whole Foods, Plant-Based Nutrition comprising of fruits and vegetables, plant-based proteins, whole-grain carbohydrates was discussed in detail with the patient.   A list for source of those nutrients were also provided to the patient.  Patient will use only water or unsweetened tea for hydration. B.  The need to stay away from risky substances including alcohol, smoking; obtaining 7 to 9 hours of  restorative sleep, at least 150 minutes of moderate intensity exercise weekly, the importance of healthy social connections,  and stress management techniques were discussed. C.  A full color page of  Calorie density of various food groups per pound showing examples of each food groups was provided to the patient.  -The package I gave him includes smart goal approaches for stress management, improved sleep, avoiding risky substances, healthy nutrition, active life, and healthy social connections.  -To cover more grounds in his vascular health, he will benefit from repeat carotid Doppler studies since he did not have previously documented minor plaque lesions on bilateral carotid arteries.  - US  Carotid Duplex Bilateral;  I encouraged him to stay on his current medications including Crestor  40 mg p.o. nightly, ezetimibe  10 mg p.o. daily, Plavix , Cardizem .  He will return in 3 months for further assessment and will have the following labs previsit: - Comprehensive metabolic panel with GFR - VITAMIN D  25 Hydroxy (Vit-D Deficiency, Fractures) - Vitamin B12 - Magnesium  - Lipid panel - FIB-4 W/REFLEX TO ELF He will also benefit from baseline gonadal assessment for androgen sufficiency - Testosterone , Free, Total, SHBG  - he is advised to maintain close follow up with [place], Betty G, MD for primary care needs, as well as his cardiology and all other specialty teams he is working with.    -Thank you for involving me in the care of this pleasant patient.  Time spent with the patient: 62  minutes spent in  counseling him about metabolic syndrome, prediabetes, hyperinsulinemia, obesity, MASLD and the rest in obtaining information about his symptoms, reviewing his previous labs/studies (including abstractions from other facilities),  evaluations, and treatments,  and developing a plan to confirm diagnosis and long term treatment based on the latest standards of care/guidelines; and documenting his  care.  Brandon Madden participated in the discussions, expressed understanding, and voiced agreement with the above plans.  All questions were answered to his satisfaction. he is encouraged to contact clinic should he have any questions or concerns prior to his return visit.  Follow up plan: Return in about 3 months (around 12/22/2023), or and doppler of carotids, for Fasting Labs  in AM B4 8.   Brandon Earl, MD [facility] [address] [place], [place] [address] Phone: [phone]  Fax: [phone]     09/21/2023, 1:07 PM  This note was partially dictated with voice recognition software. Similar sounding words can be transcribed inadequately or may not  be corrected upon review.

## 2023-09-21 NOTE — Patient Instructions (Signed)

## 2023-10-08 ENCOUNTER — Ambulatory Visit: Payer: Self-pay

## 2023-10-08 NOTE — Telephone Encounter (Signed)
 Noted

## 2023-10-08 NOTE — Telephone Encounter (Signed)
 FYI Only or Action Required?: FYI only for provider.  Patient was last seen in primary care on 03/12/2023 by [place], Brandon G, MD.  Called Nurse Triage reporting [facility].  Symptoms began several weeks ago.  Interventions attempted: Nothing.  Symptoms are: unchanged.  Triage Disposition: See HCP Within 4 Hours (Or PCP Triage)  Patient/caregiver understands and will follow disposition?: Yes 'Copied from CRM [id number]. Topic: Clinical - Red Word Triage >> Oct 08, 2023 10:29 AM Mesmerise C wrote: [facility] that prompted transfer to Nurse Triage: Patient has a rash on leg been ongoing for a couple weeks believes it may be poison oak or ivy but rash has been spreading no improvement with OTC medications Reason for Disposition  [1] Severe poison ivy, oak, or sumac reaction in the past AND [2] face or genitals involved  Answer Assessment - Initial Assessment Questions 1. APPEARANCE of RASH: What does the rash look like?      Red raised, itchy, around ankles, skin broken in spots 2. LOCATION: Where is the rash located?  (e.Madden., face, genitals, hands, legs)     ankles 3. SIZE: How large is the rash?      Large area 4. ONSET: When did the rash begin?      Couple weeks ago 5. ITCHING: Does the rash itch? If Yes, ask: How bad is it?     severe 6. EXPOSURE:  How were you exposed to the plant (poison ivy, poison oak, sumac)  When were you exposed?  Note: Sometimes a poison ivy/oak/sumac rash does not appear for 2 to 3 weeks after exposure.      Poison oak or ivy 7. PAST HISTORY: Have you had a poison ivy rash before? If Yes, ask: How bad was it?     Not in a long time 8. OTHER SYMPTOMS: Do you have any other symptoms? (e.Madden., fever)      no 9. PREGNANCY: Is there any chance you are pregnant? When was your last menstrual period?     na  Protocols used: Poison Ivy - Oak - [facility]

## 2023-10-09 ENCOUNTER — Encounter: Payer: Self-pay | Admitting: Family Medicine

## 2023-10-09 ENCOUNTER — Ambulatory Visit: Admitting: Family Medicine

## 2023-10-09 VITALS — BP 120/82 | HR 95 | Temp 98.3°F | Wt 203.0 lb

## 2023-10-09 DIAGNOSIS — L237 Allergic contact dermatitis due to plants, except food: Secondary | ICD-10-CM | POA: Diagnosis not present

## 2023-10-09 NOTE — Progress Notes (Signed)
   Subjective:    Patient ID: Brandon Madden, male    DOB: 1960/08/03, 63 y.o.   MRN: [id number]  HPI Here to check some rashes he has on the legs that appeared 3 days ago. He saw an urgent care in [facility] yesterday, and they diagnosed poison ivy. They gave him a Prednisone shot and started him on an oral Prednisone taper. Today it feels a little better but it still itches a lot.    Review of Systems  Constitutional: Negative.   Respiratory: Negative.    Cardiovascular: Negative.   Skin:  Positive for rash.       Objective:   Physical Exam Constitutional:      Appearance: Normal appearance.  Cardiovascular:     Rate and Rhythm: Normal rate and regular rhythm.     Pulses: Normal pulses.     Heart sounds: Normal heart sounds.  Pulmonary:     Effort: Pulmonary effort is normal.     Breath sounds: Normal breath sounds.  Skin:    Comments: There are patches of red papules and vesicles on both lower legs   Neurological:     Mental Status: He is alert.           Assessment & Plan:  Contact dermatitis. I told him the Prednisone shot and pills are the appropriate treatment for this. He can add topical Benadryl cream as needed.  Garnette Olmsted, MD

## 2023-10-14 ENCOUNTER — Encounter: Payer: Self-pay | Admitting: Family Medicine

## 2023-10-15 ENCOUNTER — Telehealth: Payer: Self-pay

## 2023-10-15 NOTE — Telephone Encounter (Signed)
 Copied from CRM [id number]. Topic: Clinical - Medication Question >> Oct 15, 2023 12:48 PM Lauren C wrote: Reason for CRM: Pt calling to let us  know that his prednisone (prescribed for possible poison ivy rash he's had for weeks) has run its course and the rash is still there. He is wondering if he can get a refill of the prednisone.

## 2023-10-16 ENCOUNTER — Telehealth: Payer: Self-pay

## 2023-10-16 MED ORDER — METHYLPREDNISOLONE 4 MG PO TBPK: ORAL_TABLET | ORAL | 0 refills | Status: DC

## 2023-10-16 NOTE — Telephone Encounter (Signed)
 Done

## 2023-10-16 NOTE — Telephone Encounter (Signed)
 I already took care of this.

## 2023-10-16 NOTE — Addendum Note (Signed)
 Addended by: JOHNNY SENIOR A on: 10/16/2023 08:49 AM   Modules accepted: Orders

## 2023-10-16 NOTE — Telephone Encounter (Signed)
 Copied from CRM [id number]. Topic: Clinical - Medication Question >> Oct 15, 2023 12:48 PM Lauren C wrote: Reason for CRM: Pt calling to let us  know that his prednisone (prescribed for possible poison ivy rash he's had for weeks) has run its course and the rash is still there. He is wondering if he can get a refill of the prednisone. >> Oct 16, 2023  8:19 AM Franky GRADE wrote: Patient is calling to follow up on the request for additional prescription of  prednisone.

## 2023-10-16 NOTE — Telephone Encounter (Signed)
 See other note

## 2023-10-17 ENCOUNTER — Ambulatory Visit (HOSPITAL_BASED_OUTPATIENT_CLINIC_OR_DEPARTMENT_OTHER)
Admission: RE | Admit: 2023-10-17 | Discharge: 2023-10-17 | Disposition: A | Discharge status: Home / Self Care | Source: Ambulatory Visit | Attending: "Endocrinology | Admitting: "Endocrinology

## 2023-10-17 DIAGNOSIS — E8881 Metabolic syndrome: Secondary | ICD-10-CM | POA: Insufficient documentation

## 2023-10-17 DIAGNOSIS — I2510 Atherosclerotic heart disease of native coronary artery without angina pectoris: Secondary | ICD-10-CM | POA: Diagnosis not present

## 2023-11-01 ENCOUNTER — Ambulatory Visit: Admitting: Psychology

## 2023-11-16 ENCOUNTER — Encounter (HOSPITAL_COMMUNITY): Payer: Self-pay

## 2023-11-20 ENCOUNTER — Other Ambulatory Visit: Payer: Self-pay | Admitting: Cardiology

## 2023-11-20 ENCOUNTER — Encounter (HOSPITAL_COMMUNITY)
Admission: RE | Admit: 2023-11-20 | Discharge: 2023-11-20 | Disposition: A | Discharge status: Home / Self Care | Source: Ambulatory Visit | Attending: Cardiology | Admitting: Cardiology

## 2023-11-20 DIAGNOSIS — R079 Chest pain, unspecified: Secondary | ICD-10-CM | POA: Diagnosis not present

## 2023-11-20 LAB — NM PET CT CARDIAC PERFUSION MULTI W/ABSOLUTE BLOODFLOW
LV dias vol: 77 mL (ref 62–150)
LV sys vol: 29 mL (ref 4.2–5.8)
MBFR: 3.23
Nuc Rest EF: 62 %
Nuc Stress EF: 64 %
Rest MBF: 0.64 ml/g/min
Rest Nuclear Isotope Dose: 23.5 mCi
ST Depression (mm): 0 mm
Stress MBF: 2.07 ml/g/min
Stress Nuclear Isotope Dose: 23.4 mCi

## 2023-11-20 MED ORDER — REGADENOSON 0.4 MG/5ML IV SOLN
INTRAVENOUS | Status: AC
  Filled 2023-11-20: qty 5

## 2023-11-20 MED ORDER — REGADENOSON 0.4 MG/5ML IV SOLN
0.4000 mg | Freq: Once | INTRAVENOUS | Status: AC
  Administered 2023-11-20: 0.4 mg via INTRAVENOUS

## 2023-11-20 MED ORDER — RUBIDIUM RB82 GENERATOR (RUBYFILL)
23.4300 | PACK | Freq: Once | INTRAVENOUS | Status: AC
Start: 2023-11-20 — End: 2023-11-20
  Administered 2023-11-20: 23.43 via INTRAVENOUS

## 2023-11-20 MED ORDER — RUBIDIUM RB82 GENERATOR (RUBYFILL)
23.4800 | PACK | Freq: Once | INTRAVENOUS | Status: AC
Start: 2023-11-20 — End: 2023-11-20
  Administered 2023-11-20: 23.48 via INTRAVENOUS

## 2023-11-20 NOTE — Progress Notes (Signed)
 Pt tolerated lexiscan . SOB and chest tightness during scan, resolved by the end. Only endorsed some dizziness sitting up after scan. Pt able to stand and ambulate independently. Given PO caffeine and all questions answered by this RN, understanding verbalized.

## 2023-11-21 ENCOUNTER — Ambulatory Visit: Payer: Self-pay | Admitting: Cardiology

## 2023-11-29 ENCOUNTER — Ambulatory Visit (INDEPENDENT_AMBULATORY_CARE_PROVIDER_SITE_OTHER): Admitting: Psychology

## 2023-11-29 DIAGNOSIS — F4321 Adjustment disorder with depressed mood: Secondary | ICD-10-CM

## 2023-11-29 NOTE — Progress Notes (Signed)
 Remote PPM Transmission

## 2023-11-29 NOTE — Progress Notes (Signed)
 [facility] Counselor/Therapist Progress Note  Patient ID: Deondra Wigger, MRN: [id number],    Date: 11/29/2023  Time Spent: 51 mins; start time: 1609; end time: 1700  Treatment Type: Individual Therapy  Reported Symptoms: Pt presents for session in person in the office, granting consent for the session.  Mental Status Exam: Appearance:  Casual     Behavior: Appropriate  Motor: Normal  Speech/Language:  Clear and Coherent  Affect: Appropriate  Mood: normal  Thought process: normal  Thought content:   WNL  Sensory/Perceptual disturbances:   WNL  Orientation: oriented to person, place, and time/date  Attention: Good  Concentration: Good  Memory: WNL  Fund of knowledge:  Good  Insight:   Good  Judgment:  Good  Impulse Control: Good   Risk Assessment: Danger to Self:  No Self-injurious Behavior: No Danger to Others: No Duty to Warn:no Physical Aggression / Violence:No  Access to Firearms a concern: No  Gang Involvement:No   Subjective: Pt shares that Praise God, I am doing pretty good.  At work, I have been more successful about letting things roll off of my back.  Talked with pt about his thoughts of why he believes he is being more successful not letting work get to him.  He shares that he believes that his new habit of reading his Bible at the end of the day as he is getting ready to fall asleep.  He feels better about his sleep as well as a result of this new habit.  Pt shares that he got upset at Hadassah recently when they took [place] back to [employer]; pt shares that he believes he did a good job working through that situation with Hadassah.  The kids are home from [place] for Fall Break and they had a great dinner last night with them.  Pt still has a daughter at home that she is doing well.  His daughter in [place] is leaving her job because of issues there and pt is proud of her for putting her needs first.  Pt is trying to be as intentional as he can be  to be beneficial to his marriage and he wants the same from [place]; encouraged pt to share his purpose with Hadassah so she is sure to be aware of it.  Pt had a cardiac stress test about 2 wks ago as a regular means of follow up; he had his pacemaker implanted now two years ago.  Pt is also seeing a new endocrinologist and he feels heard by him and supportive of him.  Pt continues to have vaso-vagal cardiac issues and his cardiologist is not sure what else I can do to help myself.  I don't really like that response from him.  Pt shares he is also going to the gym for the past 4 wks.  Pt continues to be troubled by the political situation in the country and how supportive Hadassah is of [name]; he understands that he cannot change her but knows God can.  Encouraged pt to be as intentional as possible about carving out time for self care between now and our follow up session; we will meet in 8 wks for a follow up session, possibly our last, per pt.  Interventions: Cognitive Behavioral Therapy  Diagnosis:Adjustment disorder with depressed mood  Plan: Treatment Plan Strengths/Abilities:  Intelligent, Intuitive, Willing to participate in therapy Treatment Preferences:  Outpatient Individual Therapy Statement of Needs:  Patient is to use CBT, mindfulness and coping skills to help manage  and/or decrease symptoms associated with their diagnosis. Symptoms:  Depressed/Irritable mood, worry, social withdrawal Problems Addressed:  Depressive thoughts, Sadness, Sleep issues, etc. Long Term Goals:  Pt to reduce overall level, frequency, and intensity of the feelings of depression as evidenced by decreased irritability, negative self talk, and helpless feelings from 6 to 7 days/week to 0 to 1 days/week, per client report, for at least 3 consecutive months.  Progress: 20% Short Term Goals:  Pt to verbally express understanding of the relationship between feelings of depression and their impact on thinking patterns and  behaviors.  Pt to verbalize an understanding of the role that distorted thinking plays in creating fears, excessive worry, and ruminations.  Progress: 20% Target Date:  07/24/2024 Frequency:  Bi-weekly Modality:  Cognitive Behavioral Therapy Interventions by Therapist:  Therapist will use CBT, Mindfulness exercises, Coping skills and Referrals, as needed by client. Client has verbally approved this treatment plan.  Francis KATHEE Macintosh, [facility]

## 2023-11-30 ENCOUNTER — Ambulatory Visit: Attending: Family Medicine

## 2023-11-30 DIAGNOSIS — I442 Atrioventricular block, complete: Secondary | ICD-10-CM | POA: Diagnosis not present

## 2023-12-05 LAB — CUP PACEART REMOTE DEVICE CHECK
Battery Remaining Longevity: 152 mo
Battery Voltage: 3.03 V
Brady Statistic AP VP Percent: 5.4 %
Brady Statistic AP VS Percent: 2.98 %
Brady Statistic AS VP Percent: 0.07 %
Brady Statistic AS VS Percent: 91.54 %
Brady Statistic RA Percent Paced: 8.34 %
Brady Statistic RV Percent Paced: 5.47 %
Date Time Interrogation Session: 20251014153915
Implantable Lead Connection Status: 753985
Implantable Lead Connection Status: 753985
Implantable Lead Implant Date: 20230713
Implantable Lead Implant Date: 20230713
Implantable Lead Location: 753859
Implantable Lead Location: 753860
Implantable Lead Model: 3830
Implantable Lead Model: 5076
Implantable Pulse Generator Implant Date: 20230713
Lead Channel Impedance Value: 342 Ohm
Lead Channel Impedance Value: 418 Ohm
Lead Channel Impedance Value: 418 Ohm
Lead Channel Impedance Value: 494 Ohm
Lead Channel Pacing Threshold Amplitude: 0.5 V
Lead Channel Pacing Threshold Amplitude: 0.875 V
Lead Channel Pacing Threshold Pulse Width: 0.4 ms
Lead Channel Pacing Threshold Pulse Width: 0.4 ms
Lead Channel Sensing Intrinsic Amplitude: 14.25 mV
Lead Channel Sensing Intrinsic Amplitude: 14.25 mV
Lead Channel Sensing Intrinsic Amplitude: 2.5 mV
Lead Channel Sensing Intrinsic Amplitude: 2.5 mV
Lead Channel Setting Pacing Amplitude: 1.5 V
Lead Channel Setting Pacing Amplitude: 2 V
Lead Channel Setting Pacing Pulse Width: 0.4 ms
Lead Channel Setting Sensing Sensitivity: 1.2 mV
Zone Setting Status: 755011

## 2023-12-06 NOTE — Progress Notes (Signed)
 Remote PPM Transmission

## 2023-12-07 ENCOUNTER — Encounter: Payer: Self-pay | Admitting: Cardiology

## 2023-12-07 DIAGNOSIS — R55 Syncope and collapse: Secondary | ICD-10-CM

## 2023-12-10 ENCOUNTER — Ambulatory Visit: Payer: Self-pay | Admitting: Cardiology

## 2023-12-26 ENCOUNTER — Encounter: Payer: Self-pay | Admitting: "Endocrinology

## 2024-01-01 ENCOUNTER — Other Ambulatory Visit: Payer: Self-pay | Admitting: "Endocrinology

## 2024-01-01 ENCOUNTER — Ambulatory Visit (INDEPENDENT_AMBULATORY_CARE_PROVIDER_SITE_OTHER): Admitting: "Endocrinology

## 2024-01-01 ENCOUNTER — Encounter: Payer: Self-pay | Admitting: "Endocrinology

## 2024-01-01 VITALS — BP 116/84 | HR 76 | Ht 66.0 in | Wt 210.4 lb

## 2024-01-01 DIAGNOSIS — E161 Other hypoglycemia: Secondary | ICD-10-CM

## 2024-01-01 DIAGNOSIS — R7303 Prediabetes: Secondary | ICD-10-CM

## 2024-01-01 DIAGNOSIS — E8881 Metabolic syndrome: Secondary | ICD-10-CM | POA: Diagnosis not present

## 2024-01-01 DIAGNOSIS — E782 Mixed hyperlipidemia: Secondary | ICD-10-CM

## 2024-01-01 DIAGNOSIS — K760 Fatty (change of) liver, not elsewhere classified: Secondary | ICD-10-CM | POA: Diagnosis not present

## 2024-01-01 MED ORDER — ROSUVASTATIN CALCIUM 20 MG PO TABS: 20.0000 mg | ORAL_TABLET | Freq: Every day | ORAL | 1 refills | Status: AC

## 2024-01-01 MED ORDER — ZEPBOUND 2.5 MG/0.5ML ~~LOC~~ SOAJ: 2.5000 mg | SUBCUTANEOUS | 0 refills | Status: DC

## 2024-01-01 NOTE — Progress Notes (Signed)
 01/01/2024, 1:32 PM  Endocrinology follow-up note  Consult for metabolic syndrome  Subjective:    Patient ID: Lister Brizzi, male    DOB: 12-18-1960, PCP Jordan, Betty G, MD   Past Medical History:  Diagnosis Date   CAD (coronary artery disease) 07/13/2021   CCTA 06/2021: CAC score 260 (84th percentile); mLAD 70-99; mLAD FFR pos (0.60) Status post DES to the mid LAD 06/2021   Carotid stenosis 08/11/2021   Carotid US  07/2021: Bilat ICA 1-39   Deafness in right ear    Family history of pancreatic cancer    Family history of prostate cancer    Family history of skin cancer    Fatty liver    High cholesterol    Hyperlipidemia LDL goal <70 08/20/2018   PAF (paroxysmal atrial fibrillation) (HCC) 05/06/2021   Pre-diabetes    Sleep apnea    Vitamin B 12 deficiency    Vitamin D  deficiency    Past Surgical History:  Procedure Laterality Date   CARDIAC CATHETERIZATION     CORONARY STENT INTERVENTION N/A 07/15/2021   Procedure: CORONARY STENT INTERVENTION;  Surgeon: Claudene Victory ORN, MD;  Location: [facility] INVASIVE CV LAB;  Service: Cardiovascular;  Laterality: N/A;   CORONARY ULTRASOUND/IVUS N/A 07/15/2021   Procedure: Intravascular Ultrasound/IVUS;  Surgeon: Claudene Victory ORN, MD;  Location: [facility] INVASIVE CV LAB;  Service: Cardiovascular;  Laterality: N/A;   INSERT / REPLACE / REMOVE PACEMAKER     LEFT HEART CATH AND CORONARY ANGIOGRAPHY N/A 07/15/2021   Procedure: LEFT HEART CATH AND CORONARY ANGIOGRAPHY;  Surgeon: Claudene Victory ORN, MD;  Location: [facility] INVASIVE CV LAB;  Service: Cardiovascular;  Laterality: N/A;   LOOP RECORDER REMOVAL N/A 09/01/2021   Procedure: LOOP RECORDER REMOVAL;  Surgeon: Inocencio Soyla Lunger, MD;  Location: [facility] INVASIVE CV LAB;  Service: Cardiovascular;  Laterality: N/A;   NASAL SEPTUM SURGERY     PACEMAKER IMPLANT N/A 09/01/2021   Procedure: PACEMAKER IMPLANT;  Surgeon: Inocencio Soyla Lunger, MD;  Location: [facility] INVASIVE CV  LAB;  Service: Cardiovascular;  Laterality: N/A;   Social History   Socioeconomic History   Marital status: Married    Spouse name: Hadassah   Number of children: 4   Years of education: Law   Highest education level: Professional school degree (e.g., MD, DDS, DVM, JD)  Occupational History   Occupation: [employer]    Comment: [employer]  Tobacco Use   Smoking status: Never   Smokeless tobacco: Never  Vaping Use   Vaping status: Never Used  Substance and Sexual Activity   Alcohol use: No    Comment: quit: 2011   Drug use: No   Sexual activity: Yes  Other Topics Concern   Not on file  Social History Narrative   Right handed   Patient lives at home with his family.   Caffeine Use: 2 cups daily   Social Drivers of Health   Financial Resource Strain: Not on file  Food Insecurity: Not on file  Transportation Needs: Not on file  Physical Activity: Not on file  Stress: Not on file (12/27/2022)  Social Connections: Not on file   Family History  Problem Relation Age of Onset   High Cholesterol Mother    Heart  attack Father    High Cholesterol Father    High blood pressure Father    Sudden death Father    Skin cancer Sister    Prostate cancer Brother        dx 15   Prostate cancer Brother        dx 44, metastatic, d 21   Pancreatic cancer Maternal Grandmother        dx 31s, d 34s   Outpatient Encounter Medications as of 01/01/2024  Medication Sig   tirzepatide (ZEPBOUND) 2.5 MG/0.5ML Pen Inject 2.5 mg into the skin once a week.   acetaminophen  (TYLENOL ) 500 MG tablet Take 500-1,000 mg by mouth every 6 (six) hours as needed (pain.).   aspirin -acetaminophen -caffeine (EXCEDRIN MIGRAINE) 250-250-65 MG tablet Take 1-2 tablets by mouth 2 (two) times daily as needed for headache.   clopidogrel  (PLAVIX ) 75 MG tablet Take 1 tablet (75 mg total) by mouth daily.   diltiazem  (CARDIZEM ) 30 MG tablet Take 1 tablet (30 mg total) by mouth 4 (four) times daily as needed.   ezetimibe  (ZETIA )  10 MG tablet TAKE 1 TABLET BY MOUTH EVERY DAY   ibuprofen (ADVIL) 200 MG tablet Take 400-600 mg by mouth every 8 (eight) hours as needed (pain.).   Lactobacillus (PROBIOTIC ACIDOPHILUS PO) Take 1 tablet by mouth 2 (two) times daily.   methylPREDNISolone  (MEDROL  DOSEPAK) 4 MG TBPK tablet As directed   Multiple Vitamins-Minerals (MULTI FOR HIM) TABS Take 1 tablet by mouth in the morning.   nitroGLYCERIN  (NITROSTAT ) 0.4 MG SL tablet Place 1 tablet (0.4 mg total) under the tongue every 5 (five) minutes as needed for chest pain.   Polyethyl Glycol-Propyl Glycol (LUBRICANT EYE DROPS) 0.4-0.3 % SOLN Place 1-2 drops into both eyes 3 (three) times daily as needed (dry/irritated eyes.).   rosuvastatin  (CRESTOR ) 20 MG tablet Take 1 tablet (20 mg total) by mouth at bedtime.   [DISCONTINUED] rosuvastatin  (CRESTOR ) 40 MG tablet Take 1 tablet (40 mg total) by mouth daily. (Patient not taking: Reported on 01/01/2024)   No facility-administered encounter medications on file as of 01/01/2024.   ALLERGIES: Allergies  Allergen Reactions   Bee Venom Anaphylaxis    VACCINATION STATUS: Immunization History  Administered Date(s) Administered   Influenza, Seasonal, Injecte, Preservative Fre 03/12/2023   Influenza,inj,Quad PF,6+ Mos 12/15/2018, 11/15/2021   Influenza-Unspecified 11/20/2020   PFIZER(Purple Top)SARS-COV-2 Vaccination 05/27/2019, 06/24/2019, 05/21/2020   Tdap 12/18/2008, 03/30/2020   Zoster Recombinant(Shingrix) 03/30/2020, 05/25/2020    HPI Brandon Madden is 63 y.o. male who presents today with a medical history as above. he is being seen in follow-up after he was seen in consultation for metabolic syndrome requested by Jordan, Betty G, MD.  History is obtained directly from the patient as well as chart review. [name] has multiple medical concerns including coronary artery disease involving the left anterior descending artery status post stent placement in May 2023, hyperlipidemia, atrial  fibrillation status post pacemaker placement, prediabetes, obstructive sleep apnea, metabolic dysfunction associated steatotic liver disease (MASLD), hyperinsulinemia, obesity. - He keeps regular, determined follow-up with his cardiology team in [place]. After he was seen in the first consult in August 2025, he has attempted to make some changes.  He has observed some improvement in his energy level, however continued to have progressive weight gain. He has not taken his statin medications over the month, and this led to loss of control of his dyslipidemia. He is not optimally engaged with lifestyle medicine nutrition discussed with him during his last visit. He  is not on any particular diet and exercise program at this time.  He reports continued, modest amount of stress related to his work.  He also reports poor sleep quality, despite CPAP machine. He was never diagnosed with diabetes, however his records show hyperinsulinemia and prediabetes at least since 2014.  This was confirmed with A1c of 6.1% in January 2025 and 5.9% in August 2025.   He wishes to achieve metabolic health without pharmaceuticals or surgery as much as possible.  He also wants to feel better in general as he is dealing with chronic fatigue.  He is a father of 4 grown children.  He reports significant family history of coronary artery disease in his father, himself diagnosed with coronary artery disease in his late 22s. Carotid Doppler done in 2023 showed minor plaque in  both of his carotid arteries, his subsequent imaging in August 2025 showed less than 50% blockage in bilateral carotid arteries.      He is concerned about his intermittent syncope/presyncope which are happening rather randomly. He underwent echocardiogram in 2022 which showed ejection fraction of 55-60%. He had brain MRI in May 2022 which showed normal findings. He is not a smoker, used EtOH in the past. He reports fluctuating body weight over the years,  giving him a BMI of 33.96 kg/m. His current medications include , nitroglycerin  as needed, ezetimibe  10 mg p.o. daily, Cardizem  30 mg 4 times daily, Plavix  75 mg daily, lactobacillus probiotic, multivitamins, and Tylenol  as needed.   Review of Systems  Constitutional: + Gained 6 pounds since last visit,  + fatigue, no subjective hyperthermia, no subjective hypothermia Eyes: no blurry vision, no xerophthalmia ENT: no sore throat, no nodules palpated in throat, no dysphagia/odynophagia, no hoarseness Cardiovascular: no Chest Pain, no Shortness of Breath, no palpitations, no leg swelling Respiratory: no cough, no shortness of breath Gastrointestinal: no Nausea/Vomiting/Diarhhea Musculoskeletal: no muscle/joint aches Skin: no rashes Neurological: no tremors, no numbness, no tingling, no dizziness Psychiatric: no depression, no anxiety  Objective:       01/01/2024    8:30 AM 11/20/2023    8:31 AM 11/20/2023    8:30 AM  Vitals with BMI  Height 5' 6    Weight 210 lbs 6 oz    BMI 33.98    Systolic 116 108 99  Diastolic 84 64 63  Pulse 76 89 86    BP 116/84   Pulse 76   Ht 5' 6 (1.676 m)   Wt 210 lb 6.4 oz (95.4 kg)   BMI 33.96 kg/m   Wt Readings from Last 3 Encounters:  01/01/24 210 lb 6.4 oz (95.4 kg)  10/09/23 203 lb (92.1 kg)  09/21/23 204 lb 3.2 oz (92.6 kg)    Physical Exam  Constitutional:  Body mass index is 33.96 kg/m.,  not in acute distress, normal state of mind Eyes: PERRLA, EOMI, no exophthalmos ENT: moist mucous membranes, no gross thyromegaly, no gross cervical lymphadenopathy   CMP ( most recent) CMP     Component Value Date/Time   NA 139 12/28/2023 0834   K 4.4 12/28/2023 0834   CL 104 12/28/2023 0834   CO2 20 12/28/2023 0834   GLUCOSE 114 (H) 12/28/2023 0834   GLUCOSE 107 (H) 03/12/2023 0751   BUN 15 12/28/2023 0834   CREATININE 1.00 12/28/2023 0834   CALCIUM  9.4 12/28/2023 0834   PROT 6.7 12/28/2023 0834   ALBUMIN 4.3 12/28/2023 0834   AST  45 (H) 12/28/2023 0834   ALT 57 (H)  12/28/2023 0834   ALKPHOS 85 12/28/2023 0834   BILITOT 0.6 12/28/2023 0834   GFR 86.44 03/12/2023 0751   EGFR 85 12/28/2023 0834   GFRNONAA 93 11/11/2012 1000     Diabetic Labs (most recent): Lab Results  Component Value Date   HGBA1C 5.9 09/21/2023   HGBA1C 6.1 03/12/2023   HGBA1C 5.4 02/27/2022     Lipid Panel ( most recent) Lipid Panel     Component Value Date/Time   CHOL 221 (H) 12/28/2023 0834   TRIG 110 12/28/2023 0834   HDL 50 12/28/2023 0834   CHOLHDL 4.4 12/28/2023 0834   CHOLHDL 2 03/12/2023 0751   VLDL 16.8 03/12/2023 0751   LDLCALC 151 (H) 12/28/2023 0834   LABVLDL 20 12/28/2023 0834      Lab Results  Component Value Date   TSH 3.17 06/28/2023   TSH 2.31 03/12/2023   TSH 1.550 03/20/2022   TSH 1.64 03/30/2020   TSH 1.89 12/18/2018   FREET4 1.12 03/20/2022         Latest Reference Range & Units 03/20/22 10:11 03/12/23 07:51 09/21/23 11:56  Cortisol Free, Ser mcg/dL  9.61   Glucose 70 - 99 mg/dL 889 (H) 892 (H)   Hemoglobin A1C 4.6 - 6.5 %  6.1   HbA1c, POC (prediabetic range) 5.7 - 6.4 %   5.9  INSULIN  2.6 - 24.9 uIU/mL 25.9 (H)    TSH 0.35 - 5.50 uIU/mL 1.550 2.31   Triiodothyronine,Free,Serum 2.0 - 4.4 pg/mL 3.2    T4,Free(Direct) 0.82 - 1.77 ng/dL 8.87    PSA 9.89 - 5.99 ng/mL  0.96     Assessment & Plan:   1. Metabolic syndrome (Primary) 2. Prediabetes 3. Mixed hyperlipidemia 4. Hyperinsulinemia 5.  Metabolic dysfunction associated steatotic liver disease  - Aslan Brandon Madden  is a pleasant patient being seen at a kind request of Jordan, Dickey MATSU, MD. - I have reviewed his  new and existing /available  records and clinically evaluated the patient. - Based on these reviews, he has multiple mild to moderate indicators of metabolic dysfunction which will increase his risk of cardiovascular more rigidity and mortality.  He remains a candidate to benefit from optimized metabolic health as detailed below.    In  the interest of controlling his major risk factors, he was approached for pharmacologic interventions while he is optimizing his lifestyle factors.  He agrees with my recommendation and prescribing Crestor  20 mg p.o. nightly and Zepbound 2.5 mg subcutaneously weekly.  Side effects and precautions are discussed with him.  This medication will be advanced as he tolerates.  I encouraged him to keep close follow-up with his cardiology and other providers while we work on his lifestyle choices.  From the point of view of all of the above concerns including prediabetes, MASLD, hyperlipidemia, hyperinsulinemia, coronary artery disease , obstructive sleep apnea combined with his desire to avoid pharmaceutical and surgical approaches, he is an immediate candidate for lifestyle medicine. He will benefit the most from weight loss and he can afford losing up to 20% of his body weight.  Options were given to him including lifestyle medicine, pharmaceutical intervention with GLP-1 receptor agonist, or surgical weight management.  He opted for lifestyle medicine for now and it is reasonable.  - he acknowledges that there is a room for improvement in his food and drink choices. - Suggestion is made for him to avoid simple carbohydrates  from his diet including Cakes, Sweet Desserts, Ice Cream, Soda (diet and regular), Sweet Tea,  Candies, Chips, Cookies, Store Bought Juices, Alcohol , Artificial Sweeteners,  Coffee Creamer, and Sugar-free Products, Lemonade. This will help patient to have more stable blood glucose profile and potentially avoid unintended weight gain.  The following Lifestyle Medicine recommendations according to American College of Lifestyle Medicine  [facility]) were discussed and and offered to patient and he  agrees to start the journey:  A. Whole Foods, Plant-Based Nutrition comprising of fruits and vegetables, plant-based proteins, whole-grain carbohydrates was discussed in detail with the patient.   A  list for source of those nutrients were also provided to the patient.  Patient will use only water or unsweetened tea for hydration. B.  The need to stay away from risky substances including alcohol, smoking; obtaining 7 to 9 hours of restorative sleep, at least 150 minutes of moderate intensity exercise weekly, the importance of healthy social connections,  and stress management techniques were discussed. C.  A full color page of  Calorie density of various food groups per pound showing examples of each food groups was provided to the patient.   -The package I gave him includes smart goal approaches for stress management, improved sleep, avoiding risky substances, healthy nutrition, active life, and healthy social connections.  - His bilateral carotid Doppler shows less than 50% blockage on bilateral carotid arteries which will need follow-up but not surgical intervention at this time.     I encouraged him to stay on his other current medications including , ezetimibe  10 mg p.o. daily, Plavix , Cardizem .  Liver fibrosis score was immediate with 1.69, ELF is still pending.  His total testosterone  is normal, slightly depressed free testosterone  due to relatively low SHBG which is high BMI.  He will return in 3 months for further assessment and will have the following labs previsit:   - he is advised to maintain close follow up with Jordan, Betty G, MD for primary care needs, as well as his cardiology and all other specialty teams he is working with.    I spent  26  minutes in the care of the patient today including review of labs from Thyroid  Function, CMP, and other relevant labs ; imaging/biopsy records (current and previous including abstractions from other facilities); face-to-face time discussing  his lab results and symptoms, medications doses, his options of short and long term treatment based on the latest standards of care / guidelines;   and documenting the encounter.  [name]   participated in the discussions, expressed understanding, and voiced agreement with the above plans.  All questions were answered to his satisfaction. he is encouraged to contact clinic should he have any questions or concerns prior to his return visit.   Follow up plan: Return in about 3 months (around 04/02/2024) for Fasting Labs  in AM B4 8, A1c -NV.   Ranny Earl, MD [facility] [address] [place], [place] [address] Phone: [phone]  Fax: [phone]     01/01/2024, 1:32 PM  This note was partially dictated with voice recognition software. Similar sounding words can be transcribed inadequately or may not  be corrected upon review.

## 2024-01-03 ENCOUNTER — Encounter (HOSPITAL_BASED_OUTPATIENT_CLINIC_OR_DEPARTMENT_OTHER): Payer: Self-pay

## 2024-01-03 ENCOUNTER — Other Ambulatory Visit: Payer: Self-pay | Admitting: "Endocrinology

## 2024-01-03 ENCOUNTER — Other Ambulatory Visit: Payer: Self-pay | Admitting: Cardiology

## 2024-01-03 DIAGNOSIS — I2510 Atherosclerotic heart disease of native coronary artery without angina pectoris: Secondary | ICD-10-CM

## 2024-01-03 LAB — COMPREHENSIVE METABOLIC PANEL WITH GFR
ALT: 57 IU/L — ABNORMAL HIGH (ref 0–44)
AST: 45 IU/L — ABNORMAL HIGH (ref 0–40)
Albumin: 4.3 g/dL (ref 3.9–4.9)
Alkaline Phosphatase: 85 IU/L (ref 47–123)
BUN/Creatinine Ratio: 15 (ref 10–24)
BUN: 15 mg/dL (ref 8–27)
Bilirubin Total: 0.6 mg/dL (ref 0.0–1.2)
CO2: 20 mmol/L (ref 20–29)
Calcium: 9.4 mg/dL (ref 8.6–10.2)
Chloride: 104 mmol/L (ref 96–106)
Creatinine, Ser: 1 mg/dL (ref 0.76–1.27)
Globulin, Total: 2.4 g/dL (ref 1.5–4.5)
Glucose: 114 mg/dL — ABNORMAL HIGH (ref 70–99)
Potassium: 4.4 mmol/L (ref 3.5–5.2)
Sodium: 139 mmol/L (ref 134–144)
Total Protein: 6.7 g/dL (ref 6.0–8.5)
eGFR: 85 mL/min/1.73 (ref >59)

## 2024-01-03 LAB — LIPID PANEL
Chol/HDL Ratio: 4.4 ratio (ref 0.0–5.0)
Cholesterol, Total: 221 mg/dL — ABNORMAL HIGH (ref 100–199)
HDL: 50 mg/dL (ref >39)
LDL Chol Calc (NIH): 151 mg/dL — ABNORMAL HIGH (ref 0–99)
Triglycerides: 110 mg/dL (ref 0–149)
VLDL Cholesterol Cal: 20 mg/dL (ref 5–40)

## 2024-01-03 LAB — TESTOSTERONE, FREE, TOTAL, SHBG
Sex Hormone Binding: 38.2 nmol/L (ref 19.3–76.4)
Testosterone, Free: 5.6 pg/mL — AB (ref 6.6–18.1)
Testosterone: 446 ng/dL (ref 264–916)

## 2024-01-03 LAB — FIB-4 W/REFLEX TO ELF
FIB-4 Index: 1.69 (ref 0.00–2.67)
Platelets: 219 x10E3/uL (ref 150–450)

## 2024-01-03 LAB — VITAMIN D 25 HYDROXY (VIT D DEFICIENCY, FRACTURES): Vit D, 25-Hydroxy: 34.5 ng/mL (ref 30.0–100.0)

## 2024-01-03 LAB — MAGNESIUM: Magnesium: 2.1 mg/dL (ref 1.6–2.3)

## 2024-01-03 LAB — VITAMIN B12: Vitamin B-12: 667 pg/mL (ref 232–1245)

## 2024-01-03 LAB — ENHANCED LIVER FIBROSIS (ELF): ELF(TM) Score: 10.81 — AB (ref <9.80)

## 2024-01-03 MED ORDER — WEGOVY 0.25 MG/0.5ML ~~LOC~~ SOAJ: 0.2500 mg | SUBCUTANEOUS | 0 refills | Status: AC

## 2024-01-03 MED ORDER — CLOPIDOGREL BISULFATE 75 MG PO TABS: 75.0000 mg | ORAL_TABLET | Freq: Every day | ORAL | 2 refills | Status: AC

## 2024-01-24 ENCOUNTER — Ambulatory Visit: Admitting: Psychology

## 2024-01-29 ENCOUNTER — Telehealth (HOSPITAL_BASED_OUTPATIENT_CLINIC_OR_DEPARTMENT_OTHER): Payer: Self-pay

## 2024-01-29 NOTE — Telephone Encounter (Signed)
   Pre-operative Risk Assessment    Patient Name: Brandon Madden  DOB: 1960-11-20 MRN: [id number]   Date of last office visit: 09/20/23 with Camn[place] Date of next office visit: NA  Request for Surgical Clearance    Procedure:  Dental Extraction - Amount of Teeth to be Pulled:  1- Surgical and Implant Placement   Date of Surgery:  Clearance TBD                                 Surgeon:  Dr. Virgene Surgeon's Group or Practice Name:  [facility] Phone number:  [phone] Fax number:  [phone]   Type of Clearance Requested:   - Medical  - Pharmacy:  Hold Clopidogrel  (Plavix ) not indicated, On clearance pt states he takes Eliquis but not listed in his chart. IN outside medications aspirin  is listed.    Type of Anesthesia:  Local    Additional requests/questions:    Signed, Augustin JONETTA Daring   01/29/2024, 9:48 AM

## 2024-01-29 NOTE — Telephone Encounter (Signed)
 Pt has been scheduled in office appt 01/30/24 Dr. Lonni for preop clearance.

## 2024-01-29 NOTE — Telephone Encounter (Signed)
   Name: Brandon Madden  DOB: 09/30/1960  MRN: [id number]  Primary Cardiologist: Shelda Bruckner, MD  Chart reviewed as part of pre-operative protocol coverage. Because of Brandon Madden past medical history and time since last visit, he will require a follow-up in-office visit in order to better assess preoperative cardiovascular risk.  Patient due for 20-month follow-up.  Pre-op covering staff: - Please schedule appointment and call patient to inform them. If patient already had an upcoming appointment within acceptable timeframe, please add pre-op clearance to the appointment notes so provider is aware. - Please contact requesting surgeon's office via preferred method (i.e, phone, fax) to inform them of need for appointment prior to surgery.  Request notes holding of Eliquis however this medication is not prescribed by cardiology and is not on his current list.  This will need to be clarified with patient at follow-up appointment.  [name] D Brandon Stofko, NP  01/29/2024, 10:19 AM

## 2024-01-29 NOTE — Telephone Encounter (Signed)
 Left message to call back and schedule an appt in office for preop clearance.

## 2024-01-29 NOTE — Telephone Encounter (Signed)
 Patient is returning call.

## 2024-01-30 ENCOUNTER — Ambulatory Visit (HOSPITAL_BASED_OUTPATIENT_CLINIC_OR_DEPARTMENT_OTHER): Admitting: Cardiology

## 2024-01-30 ENCOUNTER — Encounter (HOSPITAL_BASED_OUTPATIENT_CLINIC_OR_DEPARTMENT_OTHER): Payer: Self-pay | Admitting: Cardiology

## 2024-01-30 VITALS — BP 102/76 | HR 95 | Ht 66.0 in | Wt 213.0 lb

## 2024-01-30 DIAGNOSIS — R0789 Other chest pain: Secondary | ICD-10-CM | POA: Diagnosis not present

## 2024-01-30 DIAGNOSIS — E8881 Metabolic syndrome: Secondary | ICD-10-CM | POA: Diagnosis not present

## 2024-01-30 DIAGNOSIS — I2510 Atherosclerotic heart disease of native coronary artery without angina pectoris: Secondary | ICD-10-CM

## 2024-01-30 DIAGNOSIS — Z87898 Personal history of other specified conditions: Secondary | ICD-10-CM

## 2024-01-30 DIAGNOSIS — Z01810 Encounter for preprocedural cardiovascular examination: Secondary | ICD-10-CM | POA: Diagnosis not present

## 2024-01-30 NOTE — Patient Instructions (Signed)

## 2024-01-30 NOTE — Progress Notes (Signed)
 Cardiology Office Note:  .   Date:  01/30/2024  ID:  Brandon Madden, DOB [birth year], MRN [id number] PCP: Jordan, Betty G, MD  [facility] Providers Cardiologist:  Shelda Bruckner, MD Electrophysiologist:  Will Gladis Norton, MD {  History of Present Illness: .   Brandon Madden is a 63 y.o. male with PMH CAD s/p LAD stent 06/2021, hyperlipidemia, paroxysmal SVT and afib, bradycardia s/p pacemaker, metabolic syndrome. He was previously seen by Dr. Hobart and established care with me on 02/09/23. He is interested in lifestyle management of metabolic syndrome.  Today: He needs clearance for dental extraction with plans for implant long term. He has been asked to hold his clopidogrel . Does not require anesthesia. Has had recent normal stress test. Can achieve >4METs. Discussed recommendations, including interim aspirin  while clopidogrel  on hold, see below.   Has been following with Dr. Nida for metabolic syndrome as well. Has been working out more, goes to the gym about twice/week. Dr. Lenis also recommended restarting statin, which patient has done.   Seen by Dr. Norton 09/20/2023, reported chest discomfort, had normal cardiac PET stress. We reviewed his results at length.   Does have periodic shortness of breath that needs a deep breath. Does still have focal chest pain, which is what lead to stress test as above. Does not limit him, can work in the yard for 4-6 hours as well as regular workouts in the gym.   Hasn't had full syncope but has had presyncope 3-4 times in the last 6-8 weeks. No clear pattern/triggers other than happens on Friday mornings at work. Does not appear to be related to position changes, activity, food, volume status, temperature, etc. Dr. Norton referred him to dysautonomia clinic at [facility].   ROS: Denies PND, orthopnea, LE edema or unexpected weight gain. No full syncope recently. ROS otherwise negative except as noted.   Studies Reviewed: SABRA    EKG:  EKG  Interpretation Date/Time:  Wednesday January 30 2024 11:08:19 EST Ventricular Rate:  88 PR Interval:  148 QRS Duration:  76 QT Interval:  338 QTC Calculation: 408 R Axis:   37  Text Interpretation: Sinus rhythm with Premature atrial complexes When compared with ECG of 20-Sep-2023 08:18, No significant change was found Confirmed by Bruckner Shelda [phone]) on 01/30/2024 11:25:19 AM    Physical Exam:   VS:  BP 102/76   Pulse 95   Ht 5' 6 (1.676 m)   Wt 213 lb (96.6 kg)   SpO2 99%   BMI 34.38 kg/m    Wt Readings from Last 3 Encounters:  01/30/24 213 lb (96.6 kg)  01/01/24 210 lb 6.4 oz (95.4 kg)  10/09/23 203 lb (92.1 kg)    GEN: Well nourished, well developed in no acute distress HEENT: Normal, moist mucous membranes NECK: No JVD CARDIAC: regular rhythm, normal S1 and S2, no rubs or gallops. No murmur. VASCULAR: Radial and DP pulses 2+ bilaterally. No carotid bruits RESPIRATORY:  Clear to auscultation without rales, wheezing or rhonchi  ABDOMEN: Soft, non-tender, non-distended MUSCULOSKELETAL:  Ambulates independently SKIN: Warm and dry, no edema NEUROLOGIC:  Alert and oriented x 3. No focal neuro deficits noted. PSYCHIATRIC:  Normal affect      ASSESSMENT AND PLAN: .    Preoperative cardiovascular evaluation -Being performed by Dr. Barkley in [place]. Fax number [phone] -no symptoms, can achieve >4 METs, recent normal stress test -ok to hold clopidogrel  for 5 days prior to procedure, restart when cleared by surgeon. While clopidogrel  is being  held, patient should remain on 81 mg aspirin  daily. Once clopidogrel  restarted, aspirin  can be stopped.  CAD s/p PCI to LAD 2023 Nonobstructive carotid plaque Atypical chest pain -initial Ca score 166 in 2022 -coronary CT 06/2021 with Ca score 260, CAD in mid LAD that was severe (70-99%) with FFR positive at 0.60.  -cath 06/2021 with 95% mid LAD lesion s/p 18 x 2.75 mm DES -myoview  02/2021, cardiac PET 10/2023 without  ischemia -continue clopidogrel  75 mg daily as single antiplatelet therapy. He is not on aspirin  additionally long term, but with need for clopidogrel  hold, would use aspirin  81 mg daily while off of clopidogrel , then return to clopidogrel  alone once appropriate from surgical perspective -continue rosuvastatin  40 mg daily, ezetimibe  10 mg daily  Metabolic syndrome, with CAD, obesity, hepatic steatosis -we have discussed microbiome, exercise and diet recommendations, opportunities for use of GLPs, inflammation and data for management of this -he is working with Dr. Lenis on lifestyle medicine/management  Palpitations, syncope Paroxysmal atrial fibrillation SVT Symptomatic bradycardia s/p PPM -followed by Dr. Inocencio. Chadsvasc=1. Has PRN diltiazem . Not on anticoagulation given low chadsvasc -s/p DC-PPM after pauses seen on ILR. We have discussed his concerns re: vasovagal symptoms, he has been referred to [facility] for further evaluation  OSA -continue CPAP  Dispo: 12 mos  Signed, Shelda Bruckner, MD   Shelda Bruckner, MD, PhD, [facility]  [facility]    Heart & Vascular at [facility] [address] [place], [place] [address] [phone]

## 2024-02-07 ENCOUNTER — Ambulatory Visit (INDEPENDENT_AMBULATORY_CARE_PROVIDER_SITE_OTHER): Admitting: Psychology

## 2024-02-07 DIAGNOSIS — F4321 Adjustment disorder with depressed mood: Secondary | ICD-10-CM | POA: Diagnosis not present

## 2024-02-07 NOTE — Progress Notes (Signed)
 [facility] Counselor/Therapist Progress Note  Patient ID: Brandon Madden, MRN: [id number],    Date: 02/07/2024  Time Spent: 55 mins; start time: 1005; end time: 1100  Treatment Type: Individual Therapy  Reported Symptoms: Pt presents for session via Caregility video, granting consent for the session.  Pt shares that he is in an office at work with no one else present; he also understands the limits of virtual sessions.  I shared with the pt that I am in my office with no one else here either.  Mental Status Exam: Appearance:  Casual     Behavior: Appropriate  Motor: Normal  Speech/Language:  Clear and Coherent  Affect: Appropriate  Mood: normal  Thought process: normal  Thought content:   WNL  Sensory/Perceptual disturbances:   WNL  Orientation: oriented to person, place, and time/date  Attention: Good  Concentration: Good  Memory: WNL  Fund of knowledge:  Good  Insight:   Good  Judgment:  Good  Impulse Control: Good   Risk Assessment: Danger to Self:  No Self-injurious Behavior: No Danger to Others: No Duty to Warn:no Physical Aggression / Violence:No  Access to Firearms a concern: No  Gang Involvement:No   Notified of retirement  Subjective: Pt shares that I know this is our final session today and I would like to hear how you think I have done in therapy; things I have done well and things that I need to work on.  Talked with pt about how open he has been to feedback and how willing he has been to try suggestions made in therapy sessions and how he has benefited from those practices.  Pt shares that he has been interacting with Brandon Madden and Brandon Madden on their political perspectives.  He is frustrated by [employer] negative attitude towards him and he tries to engage with her in every way that he knows.  He shares that they did see a couples therapist before getting married but not much changed.  Pt believes she would refuse to see another therapist now because she  has refused several times in the past.  His daughter from [place] and her husband are coming to meet them in [place] for a couple of days over the holidays and he is looking forward to that.  Pt shares he would like to be able to continue working for the next 5 yrs; he is not sure he will be able to do that because of the company's recently announced intentions for succession planning.  Pt shares he is also going to the gym for the past 4 wks.  Pt continues to be troubled by the political situation in the country and how supportive Brandon Madden is of [name]; he understands that he cannot change her but knows God can.  Encouraged pt to be as intentional as possible about carving out time for self care as this is our last scheduled session.  Interventions: Cognitive Behavioral Therapy  Diagnosis:Adjustment disorder with depressed mood  Plan: Treatment Plan Strengths/Abilities:  Intelligent, Intuitive, Willing to participate in therapy Treatment Preferences:  Outpatient Individual Therapy Statement of Needs:  Patient is to use CBT, mindfulness and coping skills to help manage and/or decrease symptoms associated with their diagnosis. Symptoms:  Depressed/Irritable mood, worry, social withdrawal Problems Addressed:  Depressive thoughts, Sadness, Sleep issues, etc. Long Term Goals:  Pt to reduce overall level, frequency, and intensity of the feelings of depression as evidenced by decreased irritability, negative self talk, and helpless feelings from 6 to 7 days/week to 0  to 1 days/week, per client report, for at least 3 consecutive months.  Progress: 20% Short Term Goals:  Pt to verbally express understanding of the relationship between feelings of depression and their impact on thinking patterns and behaviors.  Pt to verbalize an understanding of the role that distorted thinking plays in creating fears, excessive worry, and ruminations.  Progress: 20% Target Date:  07/24/2024 Frequency:  Bi-weekly Modality:   Cognitive Behavioral Therapy Interventions by Therapist:  Therapist will use CBT, Mindfulness exercises, Coping skills and Referrals, as needed by client. Client has verbally approved this treatment plan.  Francis KATHEE Macintosh, [facility]

## 2024-02-27 ENCOUNTER — Encounter: Payer: Self-pay | Admitting: "Endocrinology

## 2024-02-29 ENCOUNTER — Ambulatory Visit: Attending: Cardiology

## 2024-02-29 DIAGNOSIS — I2510 Atherosclerotic heart disease of native coronary artery without angina pectoris: Secondary | ICD-10-CM | POA: Diagnosis not present

## 2024-03-02 LAB — CUP PACEART REMOTE DEVICE CHECK
Battery Remaining Longevity: 149 mo
Battery Voltage: 3.03 V
Brady Statistic AP VP Percent: 5.36 %
Brady Statistic AP VS Percent: 2.12 %
Brady Statistic AS VP Percent: 0.07 %
Brady Statistic AS VS Percent: 92.44 %
Brady Statistic RA Percent Paced: 7.4 %
Brady Statistic RV Percent Paced: 5.44 %
Date Time Interrogation Session: 20260109062954
Implantable Lead Connection Status: 753985
Implantable Lead Connection Status: 753985
Implantable Lead Implant Date: 20230713
Implantable Lead Implant Date: 20230713
Implantable Lead Location: 753859
Implantable Lead Location: 753860
Implantable Lead Model: 3830
Implantable Lead Model: 5076
Implantable Pulse Generator Implant Date: 20230713
Lead Channel Impedance Value: 342 Ohm
Lead Channel Impedance Value: 399 Ohm
Lead Channel Impedance Value: 437 Ohm
Lead Channel Impedance Value: 494 Ohm
Lead Channel Pacing Threshold Amplitude: 0.5 V
Lead Channel Pacing Threshold Amplitude: 0.75 V
Lead Channel Pacing Threshold Pulse Width: 0.4 ms
Lead Channel Pacing Threshold Pulse Width: 0.4 ms
Lead Channel Sensing Intrinsic Amplitude: 13.375 mV
Lead Channel Sensing Intrinsic Amplitude: 13.375 mV
Lead Channel Sensing Intrinsic Amplitude: 2 mV
Lead Channel Sensing Intrinsic Amplitude: 2 mV
Lead Channel Setting Pacing Amplitude: 1.5 V
Lead Channel Setting Pacing Amplitude: 2 V
Lead Channel Setting Pacing Pulse Width: 0.4 ms
Lead Channel Setting Sensing Sensitivity: 1.2 mV
Zone Setting Status: 755011

## 2024-03-03 ENCOUNTER — Ambulatory Visit: Payer: Self-pay | Admitting: Cardiology

## 2024-03-04 NOTE — Progress Notes (Signed)
 Remote PPM Transmission

## 2024-03-07 LAB — OPHTHALMOLOGY REPORT-SCANNED

## 2024-03-11 ENCOUNTER — Other Ambulatory Visit (HOSPITAL_COMMUNITY): Payer: Self-pay

## 2024-03-17 ENCOUNTER — Telehealth: Payer: Self-pay

## 2024-03-17 ENCOUNTER — Other Ambulatory Visit (HOSPITAL_COMMUNITY): Payer: Self-pay

## 2024-03-17 NOTE — Telephone Encounter (Signed)
 Pharmacy Patient Advocate Encounter   Received notification from Patient Advice Request messages that prior authorization for [employer]  is required/requested.   Insurance verification completed.   The patient is insured through [employer].   Per test claim: [facility] ENROLLMENT REQUIRED. SUSTAINABLE WEIGHT LOSS W/ AND W/O GLP-1. MEMBER ENROLL AT [email or web address].   [facility] is a guided nutrition program to lower blood sugar, reverse type 2 diabetes, lose weight and get off unwanted medications. Insurance requires pt enroll in this program before they will accept a PA.

## 2024-03-25 ENCOUNTER — Encounter: Admitting: Family Medicine

## 2024-03-28 ENCOUNTER — Encounter: Admitting: Family Medicine

## 2024-04-01 ENCOUNTER — Encounter: Admitting: Family Medicine

## 2024-04-17 ENCOUNTER — Ambulatory Visit: Admitting: "Endocrinology

## 2024-05-30 ENCOUNTER — Ambulatory Visit

## 2024-08-29 ENCOUNTER — Ambulatory Visit

## 2024-11-28 ENCOUNTER — Ambulatory Visit

## 2025-02-27 ENCOUNTER — Ambulatory Visit
